# Patient Record
Sex: Male | Born: 1945 | Race: White | Hispanic: No | Marital: Married | Smoking: Former smoker
Health system: Southern US, Community
[De-identification: ages and names within clinical notes are randomized; demographics above are authoritative.]

## PROBLEM LIST (undated history)

## (undated) DIAGNOSIS — G20A1 Parkinson's disease without dyskinesia, without mention of fluctuations: Secondary | ICD-10-CM

## (undated) DIAGNOSIS — F419 Anxiety disorder, unspecified: Secondary | ICD-10-CM

## (undated) DIAGNOSIS — E785 Hyperlipidemia, unspecified: Secondary | ICD-10-CM

## (undated) DIAGNOSIS — J449 Chronic obstructive pulmonary disease, unspecified: Secondary | ICD-10-CM

## (undated) DIAGNOSIS — I639 Cerebral infarction, unspecified: Secondary | ICD-10-CM

## (undated) DIAGNOSIS — G20 Parkinson's disease: Secondary | ICD-10-CM

## (undated) DIAGNOSIS — I951 Orthostatic hypotension: Secondary | ICD-10-CM

## (undated) DIAGNOSIS — M1990 Unspecified osteoarthritis, unspecified site: Secondary | ICD-10-CM

## (undated) DIAGNOSIS — F319 Bipolar disorder, unspecified: Secondary | ICD-10-CM

## (undated) DIAGNOSIS — I10 Essential (primary) hypertension: Secondary | ICD-10-CM

## (undated) DIAGNOSIS — Z8674 Personal history of sudden cardiac arrest: Secondary | ICD-10-CM

## (undated) HISTORY — DX: Hyperlipidemia, unspecified: E78.5

## (undated) HISTORY — DX: Orthostatic hypotension: I95.1

## (undated) HISTORY — DX: Bipolar disorder, unspecified: F31.9

## (undated) HISTORY — DX: Parkinson's disease without dyskinesia, without mention of fluctuations: G20.A1

## (undated) HISTORY — DX: Unspecified osteoarthritis, unspecified site: M19.90

## (undated) HISTORY — PX: ESOPHAGEAL DILATION: SHX303

## (undated) HISTORY — DX: Personal history of sudden cardiac arrest: Z86.74

## (undated) HISTORY — DX: Essential (primary) hypertension: I10

## (undated) HISTORY — DX: Anxiety disorder, unspecified: F41.9

## (undated) HISTORY — DX: Cerebral infarction, unspecified: I63.9

## (undated) HISTORY — DX: Chronic obstructive pulmonary disease, unspecified: J44.9

## (undated) HISTORY — DX: Parkinson's disease: G20

---

## 2001-07-03 ENCOUNTER — Emergency Department (HOSPITAL_COMMUNITY): Admission: EM | Admit: 2001-07-03 | Discharge: 2001-07-03 | Payer: Self-pay | Admitting: Emergency Medicine

## 2001-07-03 ENCOUNTER — Encounter: Payer: Self-pay | Admitting: Emergency Medicine

## 2001-11-12 ENCOUNTER — Encounter (INDEPENDENT_AMBULATORY_CARE_PROVIDER_SITE_OTHER): Payer: Self-pay | Admitting: Family Medicine

## 2001-11-18 ENCOUNTER — Emergency Department (HOSPITAL_COMMUNITY): Admission: EM | Admit: 2001-11-18 | Discharge: 2001-11-18 | Payer: Self-pay | Admitting: Emergency Medicine

## 2002-12-18 ENCOUNTER — Encounter: Payer: Self-pay | Admitting: Internal Medicine

## 2002-12-18 ENCOUNTER — Encounter: Admission: RE | Admit: 2002-12-18 | Discharge: 2002-12-18 | Payer: Self-pay | Admitting: Internal Medicine

## 2003-01-20 ENCOUNTER — Encounter (HOSPITAL_COMMUNITY): Admission: RE | Admit: 2003-01-20 | Discharge: 2003-02-19 | Payer: Self-pay | Admitting: Neurology

## 2003-02-22 ENCOUNTER — Encounter (HOSPITAL_COMMUNITY): Admission: RE | Admit: 2003-02-22 | Discharge: 2003-03-24 | Payer: Self-pay | Admitting: Neurology

## 2003-02-24 ENCOUNTER — Ambulatory Visit (HOSPITAL_COMMUNITY): Admission: RE | Admit: 2003-02-24 | Discharge: 2003-02-24 | Payer: Self-pay | Admitting: Neurology

## 2003-02-24 ENCOUNTER — Encounter: Payer: Self-pay | Admitting: Neurology

## 2003-06-09 ENCOUNTER — Encounter: Admission: RE | Admit: 2003-06-09 | Discharge: 2003-06-09 | Payer: Self-pay | Admitting: Neurosurgery

## 2003-06-24 ENCOUNTER — Encounter: Admission: RE | Admit: 2003-06-24 | Discharge: 2003-06-24 | Payer: Self-pay | Admitting: Neurosurgery

## 2003-07-12 ENCOUNTER — Encounter: Admission: RE | Admit: 2003-07-12 | Discharge: 2003-07-12 | Payer: Self-pay | Admitting: Neurosurgery

## 2003-08-01 ENCOUNTER — Encounter: Admission: RE | Admit: 2003-08-01 | Discharge: 2003-08-01 | Payer: Self-pay | Admitting: Neurosurgery

## 2003-08-17 ENCOUNTER — Encounter (HOSPITAL_COMMUNITY): Admission: RE | Admit: 2003-08-17 | Discharge: 2003-09-16 | Payer: Self-pay | Admitting: Neurosurgery

## 2003-09-28 ENCOUNTER — Encounter (HOSPITAL_COMMUNITY): Admission: RE | Admit: 2003-09-28 | Discharge: 2003-10-28 | Payer: Self-pay | Admitting: Neurosurgery

## 2003-12-20 ENCOUNTER — Encounter (HOSPITAL_COMMUNITY): Admission: RE | Admit: 2003-12-20 | Discharge: 2004-01-19 | Payer: Self-pay | Admitting: Internal Medicine

## 2004-01-23 ENCOUNTER — Encounter (INDEPENDENT_AMBULATORY_CARE_PROVIDER_SITE_OTHER): Payer: Self-pay | Admitting: Family Medicine

## 2004-03-14 ENCOUNTER — Ambulatory Visit: Payer: Self-pay | Admitting: Internal Medicine

## 2004-04-20 ENCOUNTER — Ambulatory Visit: Payer: Self-pay | Admitting: Internal Medicine

## 2004-05-08 ENCOUNTER — Emergency Department (HOSPITAL_COMMUNITY): Admission: EM | Admit: 2004-05-08 | Discharge: 2004-05-08 | Payer: Self-pay | Admitting: Emergency Medicine

## 2004-06-20 ENCOUNTER — Ambulatory Visit: Payer: Self-pay | Admitting: Gastroenterology

## 2004-06-20 ENCOUNTER — Ambulatory Visit: Payer: Self-pay | Admitting: Internal Medicine

## 2004-07-04 ENCOUNTER — Ambulatory Visit: Payer: Self-pay | Admitting: Gastroenterology

## 2004-07-06 ENCOUNTER — Ambulatory Visit (HOSPITAL_COMMUNITY): Admission: RE | Admit: 2004-07-06 | Discharge: 2004-07-06 | Payer: Self-pay | Admitting: Gastroenterology

## 2004-09-17 ENCOUNTER — Ambulatory Visit: Payer: Self-pay | Admitting: Internal Medicine

## 2004-11-21 ENCOUNTER — Emergency Department (HOSPITAL_COMMUNITY): Admission: EM | Admit: 2004-11-21 | Discharge: 2004-11-21 | Payer: Self-pay | Admitting: Emergency Medicine

## 2004-11-22 ENCOUNTER — Emergency Department (HOSPITAL_COMMUNITY): Admission: EM | Admit: 2004-11-22 | Discharge: 2004-11-22 | Payer: Self-pay | Admitting: Emergency Medicine

## 2004-11-23 ENCOUNTER — Ambulatory Visit: Payer: Self-pay | Admitting: Internal Medicine

## 2005-01-17 ENCOUNTER — Ambulatory Visit: Payer: Self-pay | Admitting: Internal Medicine

## 2005-01-25 ENCOUNTER — Ambulatory Visit: Payer: Self-pay | Admitting: Psychology

## 2005-04-04 ENCOUNTER — Ambulatory Visit: Payer: Self-pay | Admitting: Psychology

## 2005-05-02 ENCOUNTER — Ambulatory Visit: Payer: Self-pay | Admitting: Psychology

## 2005-06-03 ENCOUNTER — Ambulatory Visit: Payer: Self-pay | Admitting: Psychology

## 2005-06-17 ENCOUNTER — Ambulatory Visit: Payer: Self-pay | Admitting: Psychology

## 2005-07-01 ENCOUNTER — Ambulatory Visit: Payer: Self-pay | Admitting: Psychology

## 2005-07-12 ENCOUNTER — Ambulatory Visit: Payer: Self-pay | Admitting: Internal Medicine

## 2005-07-15 ENCOUNTER — Ambulatory Visit: Payer: Self-pay | Admitting: Psychology

## 2005-08-12 ENCOUNTER — Ambulatory Visit: Payer: Self-pay | Admitting: Psychology

## 2005-08-26 ENCOUNTER — Ambulatory Visit: Payer: Self-pay | Admitting: Psychology

## 2005-09-09 ENCOUNTER — Ambulatory Visit: Payer: Self-pay | Admitting: Psychology

## 2005-09-23 ENCOUNTER — Ambulatory Visit: Payer: Self-pay | Admitting: Psychology

## 2005-09-26 ENCOUNTER — Emergency Department (HOSPITAL_COMMUNITY): Admission: EM | Admit: 2005-09-26 | Discharge: 2005-09-26 | Payer: Self-pay | Admitting: Emergency Medicine

## 2005-10-03 ENCOUNTER — Ambulatory Visit: Payer: Self-pay | Admitting: Internal Medicine

## 2005-10-21 ENCOUNTER — Ambulatory Visit: Payer: Self-pay | Admitting: Psychology

## 2005-10-26 ENCOUNTER — Emergency Department (HOSPITAL_COMMUNITY): Admission: EM | Admit: 2005-10-26 | Discharge: 2005-10-26 | Payer: Self-pay | Admitting: Emergency Medicine

## 2005-11-14 ENCOUNTER — Emergency Department (HOSPITAL_COMMUNITY): Admission: EM | Admit: 2005-11-14 | Discharge: 2005-11-15 | Payer: Self-pay | Admitting: *Deleted

## 2005-11-18 ENCOUNTER — Observation Stay (HOSPITAL_COMMUNITY): Admission: EM | Admit: 2005-11-18 | Discharge: 2005-11-20 | Payer: Self-pay | Admitting: Emergency Medicine

## 2005-11-20 ENCOUNTER — Ambulatory Visit: Payer: Self-pay | Admitting: Internal Medicine

## 2005-12-15 ENCOUNTER — Inpatient Hospital Stay (HOSPITAL_COMMUNITY): Admission: EM | Admit: 2005-12-15 | Discharge: 2005-12-18 | Payer: Self-pay | Admitting: Emergency Medicine

## 2005-12-16 ENCOUNTER — Ambulatory Visit: Payer: Self-pay | Admitting: Cardiology

## 2005-12-16 ENCOUNTER — Encounter: Payer: Self-pay | Admitting: Cardiology

## 2005-12-31 ENCOUNTER — Emergency Department (HOSPITAL_COMMUNITY): Admission: EM | Admit: 2005-12-31 | Discharge: 2006-01-01 | Payer: Self-pay | Admitting: Emergency Medicine

## 2006-01-13 ENCOUNTER — Ambulatory Visit: Payer: Self-pay | Admitting: Family Medicine

## 2006-01-30 ENCOUNTER — Ambulatory Visit: Payer: Self-pay | Admitting: Family Medicine

## 2006-02-03 ENCOUNTER — Emergency Department (HOSPITAL_COMMUNITY): Admission: EM | Admit: 2006-02-03 | Discharge: 2006-02-03 | Payer: Self-pay | Admitting: Emergency Medicine

## 2006-02-05 ENCOUNTER — Ambulatory Visit: Payer: Self-pay | Admitting: Internal Medicine

## 2006-02-05 ENCOUNTER — Emergency Department (HOSPITAL_COMMUNITY): Admission: EM | Admit: 2006-02-05 | Discharge: 2006-02-06 | Payer: Self-pay | Admitting: Emergency Medicine

## 2006-02-05 ENCOUNTER — Emergency Department (HOSPITAL_COMMUNITY): Admission: EM | Admit: 2006-02-05 | Discharge: 2006-02-05 | Payer: Self-pay | Admitting: Emergency Medicine

## 2006-02-05 HISTORY — PX: ESOPHAGOGASTRODUODENOSCOPY: SHX1529

## 2006-02-06 ENCOUNTER — Inpatient Hospital Stay (HOSPITAL_COMMUNITY): Admission: EM | Admit: 2006-02-06 | Discharge: 2006-02-09 | Payer: Self-pay | Admitting: Emergency Medicine

## 2006-02-18 ENCOUNTER — Ambulatory Visit: Payer: Self-pay | Admitting: Family Medicine

## 2006-03-03 ENCOUNTER — Ambulatory Visit: Payer: Self-pay | Admitting: Family Medicine

## 2006-03-10 ENCOUNTER — Emergency Department (HOSPITAL_COMMUNITY): Admission: EM | Admit: 2006-03-10 | Discharge: 2006-03-10 | Payer: Self-pay | Admitting: Emergency Medicine

## 2006-03-12 ENCOUNTER — Ambulatory Visit: Payer: Self-pay | Admitting: Family Medicine

## 2006-03-13 ENCOUNTER — Ambulatory Visit (HOSPITAL_COMMUNITY): Payer: Self-pay | Admitting: Psychiatry

## 2006-04-01 ENCOUNTER — Ambulatory Visit (HOSPITAL_COMMUNITY): Payer: Self-pay | Admitting: Psychiatry

## 2006-04-03 ENCOUNTER — Ambulatory Visit: Payer: Self-pay | Admitting: Family Medicine

## 2006-04-09 ENCOUNTER — Encounter: Payer: Self-pay | Admitting: Family Medicine

## 2006-04-09 DIAGNOSIS — N318 Other neuromuscular dysfunction of bladder: Secondary | ICD-10-CM

## 2006-04-09 DIAGNOSIS — F411 Generalized anxiety disorder: Secondary | ICD-10-CM | POA: Insufficient documentation

## 2006-04-09 DIAGNOSIS — F329 Major depressive disorder, single episode, unspecified: Secondary | ICD-10-CM

## 2006-04-09 DIAGNOSIS — M129 Arthropathy, unspecified: Secondary | ICD-10-CM | POA: Insufficient documentation

## 2006-04-09 DIAGNOSIS — M545 Low back pain: Secondary | ICD-10-CM

## 2006-04-09 DIAGNOSIS — Z8679 Personal history of other diseases of the circulatory system: Secondary | ICD-10-CM | POA: Insufficient documentation

## 2006-04-09 DIAGNOSIS — F319 Bipolar disorder, unspecified: Secondary | ICD-10-CM | POA: Insufficient documentation

## 2006-04-09 DIAGNOSIS — J449 Chronic obstructive pulmonary disease, unspecified: Secondary | ICD-10-CM

## 2006-04-09 DIAGNOSIS — R32 Unspecified urinary incontinence: Secondary | ICD-10-CM

## 2006-04-09 DIAGNOSIS — N400 Enlarged prostate without lower urinary tract symptoms: Secondary | ICD-10-CM | POA: Insufficient documentation

## 2006-04-09 DIAGNOSIS — G20 Parkinson's disease: Secondary | ICD-10-CM

## 2006-04-09 DIAGNOSIS — J4489 Other specified chronic obstructive pulmonary disease: Secondary | ICD-10-CM | POA: Insufficient documentation

## 2006-04-09 DIAGNOSIS — F3289 Other specified depressive episodes: Secondary | ICD-10-CM | POA: Insufficient documentation

## 2006-04-09 DIAGNOSIS — K219 Gastro-esophageal reflux disease without esophagitis: Secondary | ICD-10-CM

## 2006-04-09 DIAGNOSIS — I10 Essential (primary) hypertension: Secondary | ICD-10-CM | POA: Insufficient documentation

## 2006-04-21 ENCOUNTER — Encounter (INDEPENDENT_AMBULATORY_CARE_PROVIDER_SITE_OTHER): Payer: Self-pay | Admitting: Family Medicine

## 2006-05-01 ENCOUNTER — Ambulatory Visit: Payer: Self-pay | Admitting: Family Medicine

## 2006-05-01 ENCOUNTER — Ambulatory Visit: Payer: Self-pay | Admitting: Internal Medicine

## 2006-05-13 ENCOUNTER — Ambulatory Visit: Payer: Self-pay | Admitting: Family Medicine

## 2006-05-14 ENCOUNTER — Encounter (INDEPENDENT_AMBULATORY_CARE_PROVIDER_SITE_OTHER): Payer: Self-pay | Admitting: Family Medicine

## 2006-05-14 LAB — CONVERTED CEMR LAB
ALT: 14 units/L (ref 0–53)
AST: 15 units/L (ref 0–37)
Albumin: 4.4 g/dL (ref 3.5–5.2)
Alkaline Phosphatase: 66 units/L (ref 39–117)
BUN: 14 mg/dL (ref 6–23)
Basophils Absolute: 0 10*3/uL (ref 0.0–0.1)
Basophils Relative: 1 % (ref 0–1)
CO2: 22 meq/L (ref 19–32)
Calcium: 9.6 mg/dL (ref 8.4–10.5)
Chloride: 105 meq/L (ref 96–112)
Creatinine, Ser: 0.94 mg/dL (ref 0.40–1.50)
Eosinophils Relative: 2 % (ref 0–5)
Glucose, Bld: 91 mg/dL (ref 70–99)
HCT: 45.6 % (ref 39.0–52.0)
Hemoglobin: 14.9 g/dL (ref 13.0–17.0)
Lymphocytes Relative: 27 % (ref 12–46)
Lymphs Abs: 1.5 10*3/uL (ref 0.7–3.3)
MCHC: 32.7 g/dL (ref 30.0–36.0)
MCV: 91 fL (ref 78.0–100.0)
Monocytes Absolute: 0.5 10*3/uL (ref 0.2–0.7)
Monocytes Relative: 8 % (ref 3–11)
Neutro Abs: 3.5 10*3/uL (ref 1.7–7.7)
Neutrophils Relative %: 62 % (ref 43–77)
Platelets: 213 10*3/uL (ref 150–400)
Potassium: 4.5 meq/L (ref 3.5–5.3)
RBC: 5.01 M/uL (ref 4.22–5.81)
RDW: 13.8 % (ref 11.5–14.0)
Sodium: 140 meq/L (ref 135–145)
Total Bilirubin: 0.4 mg/dL (ref 0.3–1.2)
Total Protein: 7.6 g/dL (ref 6.0–8.3)
WBC: 5.6 10*3/uL (ref 4.0–10.5)

## 2006-05-15 ENCOUNTER — Ambulatory Visit (HOSPITAL_COMMUNITY): Admission: RE | Admit: 2006-05-15 | Discharge: 2006-05-15 | Payer: Self-pay | Admitting: Internal Medicine

## 2006-05-15 ENCOUNTER — Encounter (INDEPENDENT_AMBULATORY_CARE_PROVIDER_SITE_OTHER): Payer: Self-pay | Admitting: Family Medicine

## 2006-05-15 ENCOUNTER — Encounter (INDEPENDENT_AMBULATORY_CARE_PROVIDER_SITE_OTHER): Payer: Self-pay | Admitting: Specialist

## 2006-05-15 ENCOUNTER — Ambulatory Visit: Payer: Self-pay | Admitting: Internal Medicine

## 2006-05-15 HISTORY — PX: COLONOSCOPY: SHX174

## 2006-05-16 ENCOUNTER — Emergency Department (HOSPITAL_COMMUNITY): Admission: EM | Admit: 2006-05-16 | Discharge: 2006-05-16 | Payer: Self-pay | Admitting: Emergency Medicine

## 2006-05-20 ENCOUNTER — Ambulatory Visit (HOSPITAL_COMMUNITY): Payer: Self-pay | Admitting: Psychiatry

## 2006-05-27 ENCOUNTER — Ambulatory Visit: Payer: Self-pay | Admitting: Family Medicine

## 2006-05-27 ENCOUNTER — Ambulatory Visit (HOSPITAL_COMMUNITY): Payer: Self-pay | Admitting: Psychiatry

## 2006-05-29 ENCOUNTER — Ambulatory Visit (HOSPITAL_COMMUNITY): Payer: Self-pay | Admitting: Psychology

## 2006-06-02 ENCOUNTER — Ambulatory Visit: Payer: Self-pay | Admitting: Internal Medicine

## 2006-06-06 ENCOUNTER — Encounter (HOSPITAL_COMMUNITY): Admission: RE | Admit: 2006-06-06 | Discharge: 2006-07-06 | Payer: Self-pay | Admitting: Internal Medicine

## 2006-06-11 ENCOUNTER — Ambulatory Visit (HOSPITAL_COMMUNITY): Payer: Self-pay | Admitting: Psychology

## 2006-06-16 ENCOUNTER — Ambulatory Visit: Payer: Self-pay | Admitting: Family Medicine

## 2006-06-16 ENCOUNTER — Ambulatory Visit (HOSPITAL_COMMUNITY): Admission: RE | Admit: 2006-06-16 | Discharge: 2006-06-16 | Payer: Self-pay | Admitting: Family Medicine

## 2006-06-16 DIAGNOSIS — K5909 Other constipation: Secondary | ICD-10-CM | POA: Insufficient documentation

## 2006-06-16 DIAGNOSIS — L570 Actinic keratosis: Secondary | ICD-10-CM

## 2006-06-16 LAB — CONVERTED CEMR LAB

## 2006-06-17 ENCOUNTER — Ambulatory Visit (HOSPITAL_COMMUNITY): Payer: Self-pay | Admitting: Psychiatry

## 2006-06-23 ENCOUNTER — Encounter (INDEPENDENT_AMBULATORY_CARE_PROVIDER_SITE_OTHER): Payer: Self-pay | Admitting: Family Medicine

## 2006-06-27 ENCOUNTER — Encounter (INDEPENDENT_AMBULATORY_CARE_PROVIDER_SITE_OTHER): Payer: Self-pay | Admitting: Family Medicine

## 2006-06-28 ENCOUNTER — Emergency Department (HOSPITAL_COMMUNITY): Admission: EM | Admit: 2006-06-28 | Discharge: 2006-06-28 | Payer: Self-pay | Admitting: Emergency Medicine

## 2006-06-30 ENCOUNTER — Telehealth (INDEPENDENT_AMBULATORY_CARE_PROVIDER_SITE_OTHER): Payer: Self-pay | Admitting: Family Medicine

## 2006-07-14 ENCOUNTER — Ambulatory Visit: Payer: Self-pay | Admitting: Family Medicine

## 2006-07-14 ENCOUNTER — Ambulatory Visit (HOSPITAL_COMMUNITY): Payer: Self-pay | Admitting: Psychology

## 2006-07-17 IMAGING — CR DG CHEST 2V
2 series · 2 of 2 positions shown · non-contrast
Comparison: 11/21/2004.

CLINICAL DATA: Chest pain.  
 CHEST - 2 VIEW:

[view not recorded (1 of 2)]
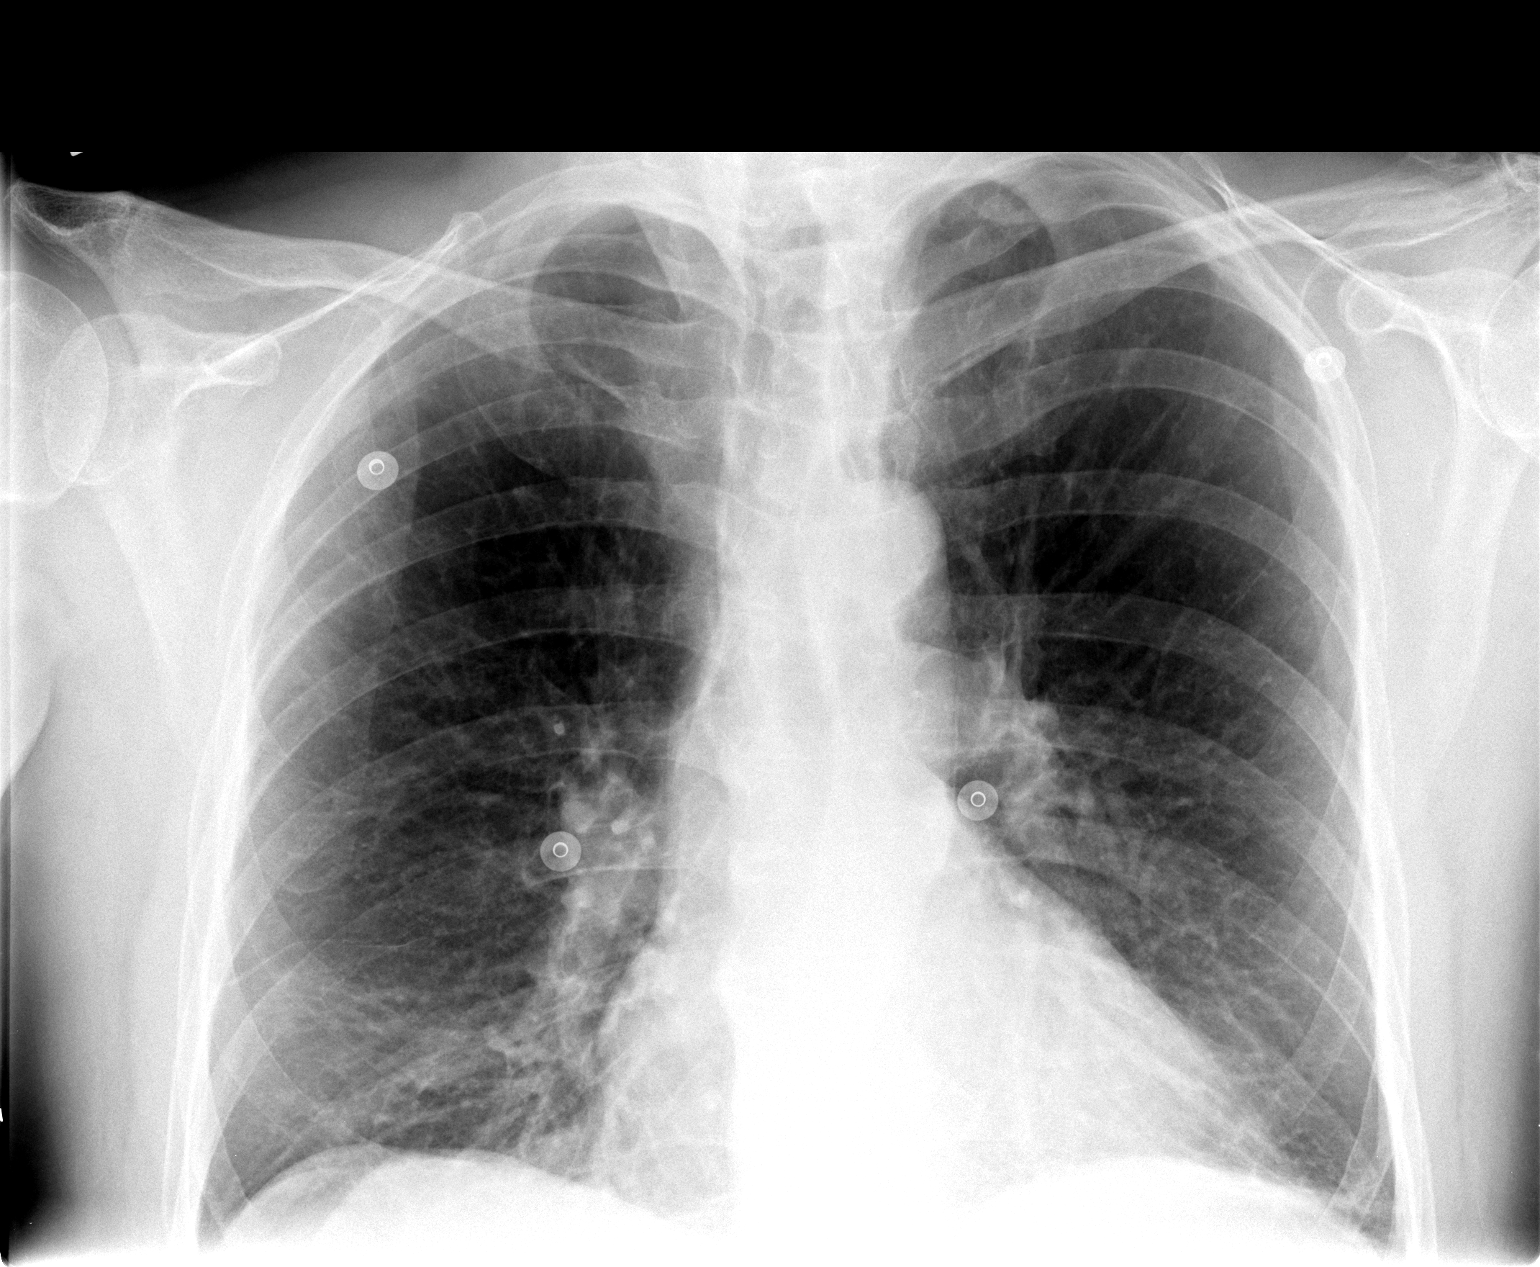

[view not recorded (2 of 2)]
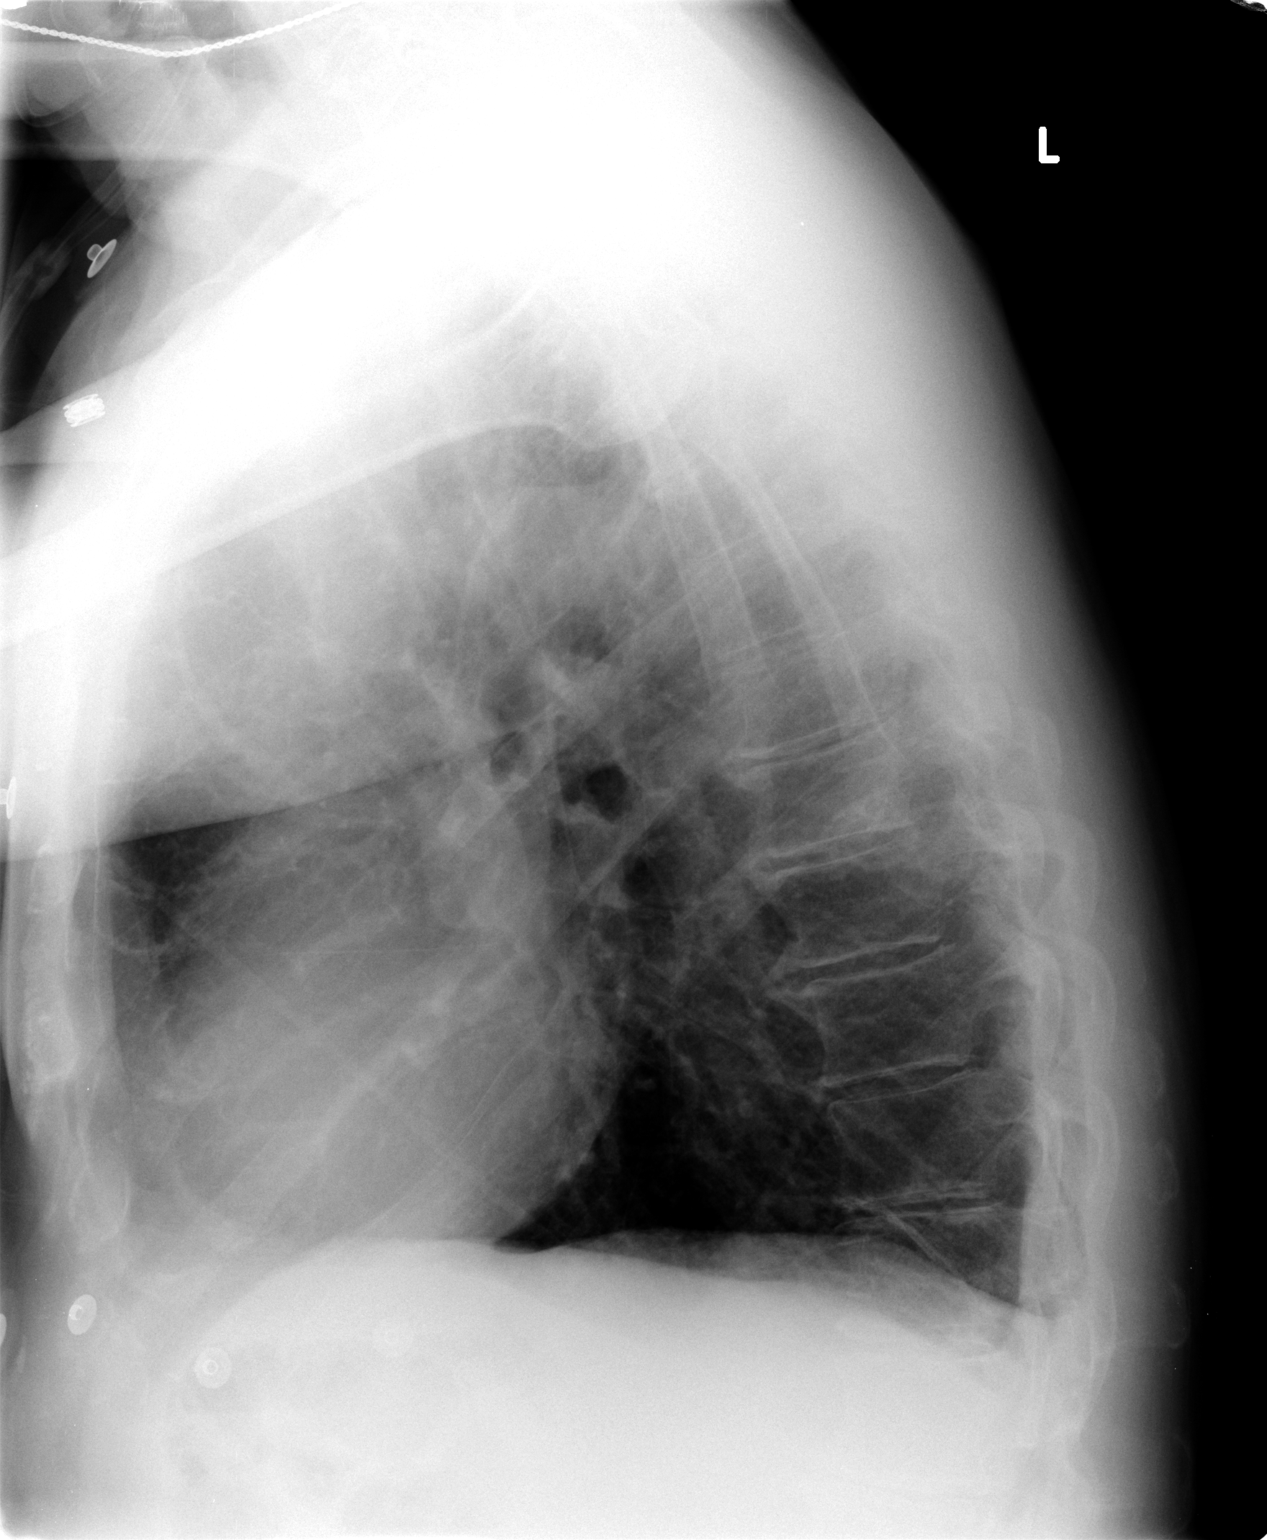

[2 of 2 positions shown; findings below may reference images not displayed]

Cardiomegaly and probable COPD noted.  Mild peribronchial thickening is again present.  Mild bibasilar atelectasis is now noted.  There may be tiny bilateral pleural effusions present.
IMPRESSION: 1.   Bibasilar atelectasis.  Question tiny bilateral pleural effusions. 
 2.  COPD and cardiomegaly.

## 2006-07-22 ENCOUNTER — Ambulatory Visit: Payer: Self-pay | Admitting: Internal Medicine

## 2006-07-22 ENCOUNTER — Encounter (INDEPENDENT_AMBULATORY_CARE_PROVIDER_SITE_OTHER): Payer: Self-pay | Admitting: Family Medicine

## 2006-07-29 ENCOUNTER — Ambulatory Visit (HOSPITAL_COMMUNITY): Payer: Self-pay | Admitting: Psychiatry

## 2006-08-06 ENCOUNTER — Ambulatory Visit (HOSPITAL_COMMUNITY): Payer: Self-pay | Admitting: Psychology

## 2006-08-08 ENCOUNTER — Encounter (INDEPENDENT_AMBULATORY_CARE_PROVIDER_SITE_OTHER): Payer: Self-pay | Admitting: Family Medicine

## 2006-08-11 ENCOUNTER — Ambulatory Visit: Payer: Self-pay | Admitting: Family Medicine

## 2006-08-11 DIAGNOSIS — K8020 Calculus of gallbladder without cholecystitis without obstruction: Secondary | ICD-10-CM | POA: Insufficient documentation

## 2006-08-14 ENCOUNTER — Telehealth (INDEPENDENT_AMBULATORY_CARE_PROVIDER_SITE_OTHER): Payer: Self-pay | Admitting: Family Medicine

## 2006-08-15 ENCOUNTER — Ambulatory Visit: Payer: Self-pay | Admitting: Family Medicine

## 2006-08-15 ENCOUNTER — Telehealth (INDEPENDENT_AMBULATORY_CARE_PROVIDER_SITE_OTHER): Payer: Self-pay | Admitting: Family Medicine

## 2006-08-15 ENCOUNTER — Telehealth (INDEPENDENT_AMBULATORY_CARE_PROVIDER_SITE_OTHER): Payer: Self-pay | Admitting: *Deleted

## 2006-08-21 ENCOUNTER — Ambulatory Visit (HOSPITAL_COMMUNITY): Payer: Self-pay | Admitting: Psychiatry

## 2006-08-29 ENCOUNTER — Encounter (INDEPENDENT_AMBULATORY_CARE_PROVIDER_SITE_OTHER): Payer: Self-pay | Admitting: Family Medicine

## 2006-09-01 ENCOUNTER — Telehealth (INDEPENDENT_AMBULATORY_CARE_PROVIDER_SITE_OTHER): Payer: Self-pay | Admitting: *Deleted

## 2006-09-01 ENCOUNTER — Ambulatory Visit: Payer: Self-pay | Admitting: Family Medicine

## 2006-09-02 ENCOUNTER — Encounter (INDEPENDENT_AMBULATORY_CARE_PROVIDER_SITE_OTHER): Payer: Self-pay | Admitting: Family Medicine

## 2006-09-02 LAB — CONVERTED CEMR LAB
Albumin: 4.3 g/dL (ref 3.5–5.2)
CO2: 19 meq/L (ref 19–32)
Calcium: 9.2 mg/dL (ref 8.4–10.5)
Chloride: 107 meq/L (ref 96–112)
Cholesterol: 188 mg/dL (ref 0–200)
Eosinophils Relative: 4 % (ref 0–5)
Glucose, Bld: 84 mg/dL (ref 70–99)
HCT: 45.1 % (ref 39.0–52.0)
Hemoglobin: 14.8 g/dL (ref 13.0–17.0)
Lymphocytes Relative: 33 % (ref 12–46)
Lymphs Abs: 1.5 10*3/uL (ref 0.7–3.3)
Platelets: 226 10*3/uL (ref 150–400)
Sodium: 142 meq/L (ref 135–145)
Total Bilirubin: 0.4 mg/dL (ref 0.3–1.2)
Total Protein: 7.6 g/dL (ref 6.0–8.3)
Triglycerides: 159 mg/dL — ABNORMAL HIGH (ref <150)
VLDL: 32 mg/dL (ref 0–40)
WBC: 4.6 10*3/uL (ref 4.0–10.5)

## 2006-09-04 ENCOUNTER — Ambulatory Visit: Payer: Self-pay | Admitting: Cardiology

## 2006-09-08 ENCOUNTER — Ambulatory Visit (HOSPITAL_COMMUNITY): Admission: RE | Admit: 2006-09-08 | Discharge: 2006-09-08 | Payer: Self-pay | Admitting: Family Medicine

## 2006-09-08 ENCOUNTER — Encounter (HOSPITAL_COMMUNITY): Admission: RE | Admit: 2006-09-08 | Discharge: 2006-10-08 | Payer: Self-pay | Admitting: Cardiology

## 2006-09-08 ENCOUNTER — Ambulatory Visit: Payer: Self-pay | Admitting: Cardiology

## 2006-09-11 ENCOUNTER — Encounter (INDEPENDENT_AMBULATORY_CARE_PROVIDER_SITE_OTHER): Payer: Self-pay | Admitting: Family Medicine

## 2006-09-11 ENCOUNTER — Ambulatory Visit: Payer: Self-pay | Admitting: Cardiology

## 2006-09-15 ENCOUNTER — Ambulatory Visit: Payer: Self-pay | Admitting: Family Medicine

## 2006-09-15 DIAGNOSIS — E785 Hyperlipidemia, unspecified: Secondary | ICD-10-CM | POA: Insufficient documentation

## 2006-09-15 LAB — CONVERTED CEMR LAB
HDL goal, serum: 40 mg/dL
LDL Goal: 100 mg/dL

## 2006-09-18 ENCOUNTER — Encounter (INDEPENDENT_AMBULATORY_CARE_PROVIDER_SITE_OTHER): Payer: Self-pay | Admitting: Family Medicine

## 2006-09-30 ENCOUNTER — Ambulatory Visit (HOSPITAL_COMMUNITY): Payer: Self-pay | Admitting: Psychiatry

## 2006-10-03 ENCOUNTER — Telehealth (INDEPENDENT_AMBULATORY_CARE_PROVIDER_SITE_OTHER): Payer: Self-pay | Admitting: Family Medicine

## 2006-10-09 ENCOUNTER — Encounter (INDEPENDENT_AMBULATORY_CARE_PROVIDER_SITE_OTHER): Payer: Self-pay | Admitting: Family Medicine

## 2006-10-13 ENCOUNTER — Ambulatory Visit: Payer: Self-pay | Admitting: Family Medicine

## 2006-10-14 ENCOUNTER — Encounter (INDEPENDENT_AMBULATORY_CARE_PROVIDER_SITE_OTHER): Payer: Self-pay | Admitting: Family Medicine

## 2006-10-14 LAB — CONVERTED CEMR LAB
ALT: 9 units/L (ref 0–53)
AST: 18 units/L (ref 0–37)
Albumin: 4.3 g/dL (ref 3.5–5.2)
Alkaline Phosphatase: 62 units/L (ref 39–117)
Bilirubin, Direct: <0.1 mg/dL (ref 0.0–0.3)
Total Bilirubin: 0.4 mg/dL (ref 0.3–1.2)
Total Protein: 7.6 g/dL (ref 6.0–8.3)

## 2006-10-23 ENCOUNTER — Telehealth (INDEPENDENT_AMBULATORY_CARE_PROVIDER_SITE_OTHER): Payer: Self-pay | Admitting: Family Medicine

## 2006-11-03 ENCOUNTER — Ambulatory Visit: Admission: RE | Admit: 2006-11-03 | Discharge: 2006-11-03 | Payer: Self-pay | Admitting: Family Medicine

## 2006-11-03 ENCOUNTER — Telehealth (INDEPENDENT_AMBULATORY_CARE_PROVIDER_SITE_OTHER): Payer: Self-pay | Admitting: *Deleted

## 2006-11-04 ENCOUNTER — Ambulatory Visit: Payer: Self-pay | Admitting: Family Medicine

## 2006-11-05 ENCOUNTER — Encounter (INDEPENDENT_AMBULATORY_CARE_PROVIDER_SITE_OTHER): Payer: Self-pay | Admitting: Family Medicine

## 2006-11-06 ENCOUNTER — Telehealth (INDEPENDENT_AMBULATORY_CARE_PROVIDER_SITE_OTHER): Payer: Self-pay | Admitting: *Deleted

## 2006-11-06 ENCOUNTER — Encounter (INDEPENDENT_AMBULATORY_CARE_PROVIDER_SITE_OTHER): Payer: Self-pay | Admitting: Family Medicine

## 2006-11-06 LAB — CONVERTED CEMR LAB: PSA: 2.4 ng/mL (ref 0.10–4.00)

## 2006-11-08 ENCOUNTER — Ambulatory Visit: Payer: Self-pay | Admitting: Pulmonary Disease

## 2006-11-10 ENCOUNTER — Telehealth (INDEPENDENT_AMBULATORY_CARE_PROVIDER_SITE_OTHER): Payer: Self-pay | Admitting: Family Medicine

## 2006-11-17 ENCOUNTER — Encounter (INDEPENDENT_AMBULATORY_CARE_PROVIDER_SITE_OTHER): Payer: Self-pay | Admitting: Family Medicine

## 2006-11-19 ENCOUNTER — Encounter (INDEPENDENT_AMBULATORY_CARE_PROVIDER_SITE_OTHER): Payer: Self-pay | Admitting: Family Medicine

## 2006-11-19 ENCOUNTER — Telehealth (INDEPENDENT_AMBULATORY_CARE_PROVIDER_SITE_OTHER): Payer: Self-pay | Admitting: Family Medicine

## 2006-11-24 ENCOUNTER — Ambulatory Visit: Payer: Self-pay | Admitting: Family Medicine

## 2006-11-25 ENCOUNTER — Ambulatory Visit (HOSPITAL_COMMUNITY): Admission: RE | Admit: 2006-11-25 | Discharge: 2006-11-25 | Payer: Self-pay | Admitting: Family Medicine

## 2006-11-25 ENCOUNTER — Ambulatory Visit (HOSPITAL_COMMUNITY): Payer: Self-pay | Admitting: Psychiatry

## 2006-11-26 ENCOUNTER — Telehealth (INDEPENDENT_AMBULATORY_CARE_PROVIDER_SITE_OTHER): Payer: Self-pay | Admitting: *Deleted

## 2006-11-26 ENCOUNTER — Encounter (INDEPENDENT_AMBULATORY_CARE_PROVIDER_SITE_OTHER): Payer: Self-pay | Admitting: Family Medicine

## 2006-11-26 DIAGNOSIS — M51379 Other intervertebral disc degeneration, lumbosacral region without mention of lumbar back pain or lower extremity pain: Secondary | ICD-10-CM | POA: Insufficient documentation

## 2006-11-26 DIAGNOSIS — M5137 Other intervertebral disc degeneration, lumbosacral region: Secondary | ICD-10-CM | POA: Insufficient documentation

## 2006-11-27 ENCOUNTER — Ambulatory Visit (HOSPITAL_COMMUNITY): Admission: RE | Admit: 2006-11-27 | Discharge: 2006-11-27 | Payer: Self-pay | Admitting: Family Medicine

## 2006-11-27 IMAGING — CT CT ABDOMEN W/ CM
1 of 3 series · 14 of 32 positions shown, 19 images · IV contrast (CONTRAST)
Comparison: none

CLINICAL DATA: Abdominal pain, nausea, and vomiting. 
 ABDOMEN CT WITH CONTRAST:
TECHNIQUE: Multidetector CT imaging of the abdomen was performed following the standard protocol during bolus administration of intravenous contrast.
 Contrast:  100 cc Omnipaque 300
TECHNIQUE: Multidetector CT imaging of the pelvis was performed following the standard protocol during bolus administration of intravenous contrast.

[Series 4333: — · axial · 0.85mm/px · z∈[+1265,+1715]mm · 14 of 104 slices shown, 19 images]
[im 7/104  soft-tissue]
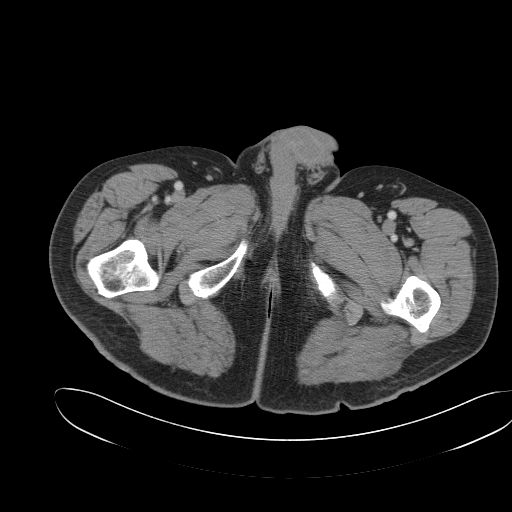
[im 7/104  bone]
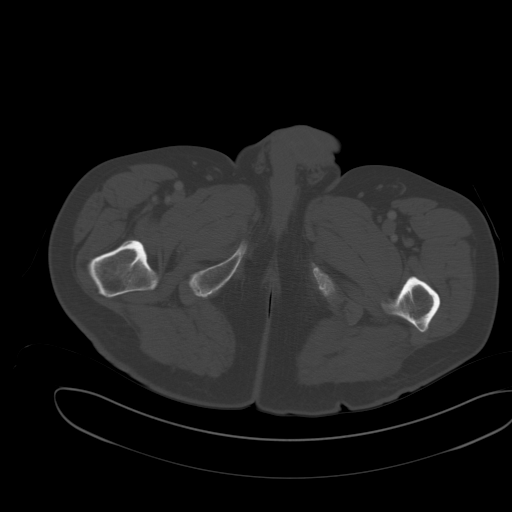
[im 13/104  soft-tissue]
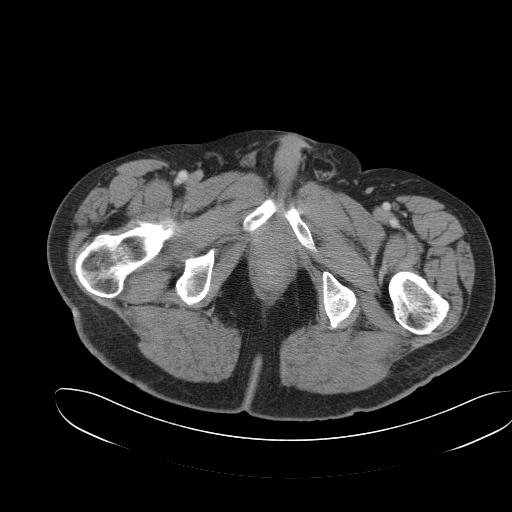
[im 25/104  soft-tissue]
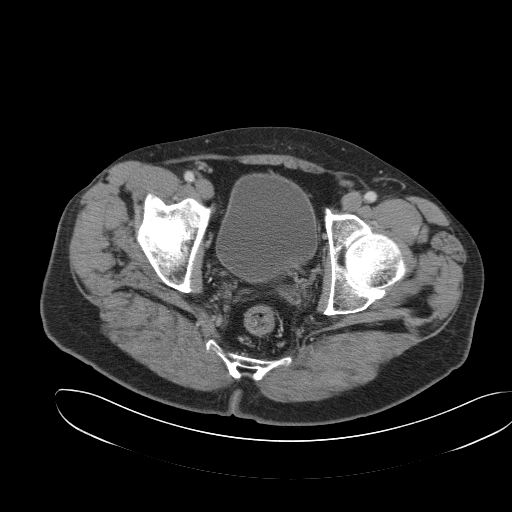
[im 31/104  soft-tissue]
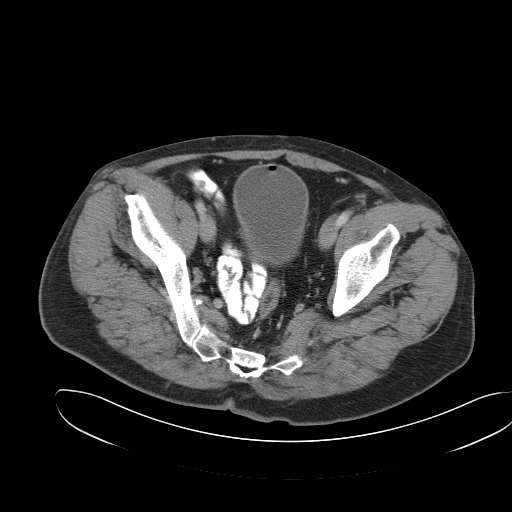
[im 37/104  soft-tissue]
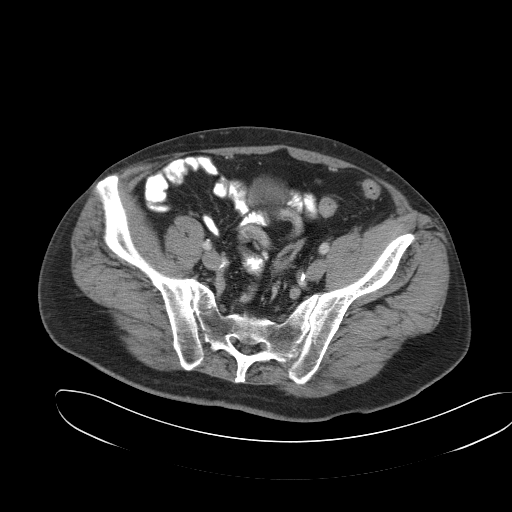
[im 43/104  soft-tissue]
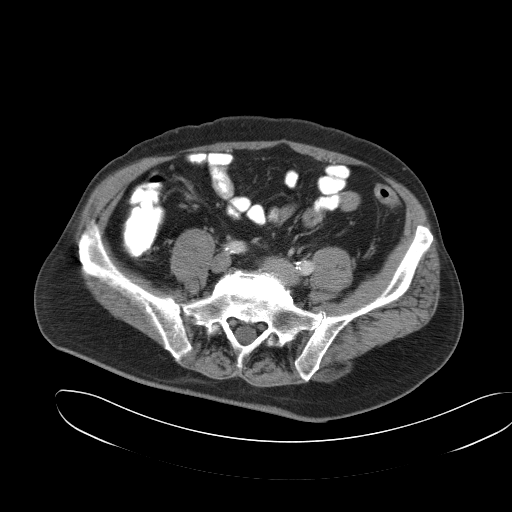
[im 55/104  soft-tissue]
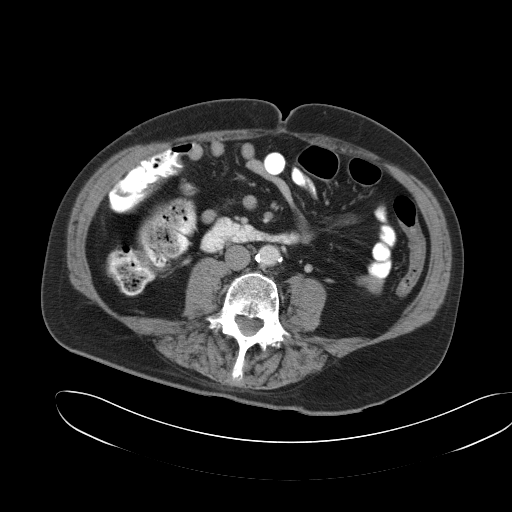
[im 61/104  soft-tissue]
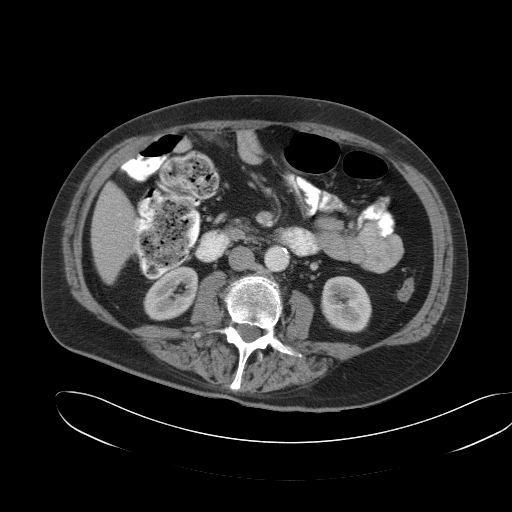
[im 67/104  soft-tissue]
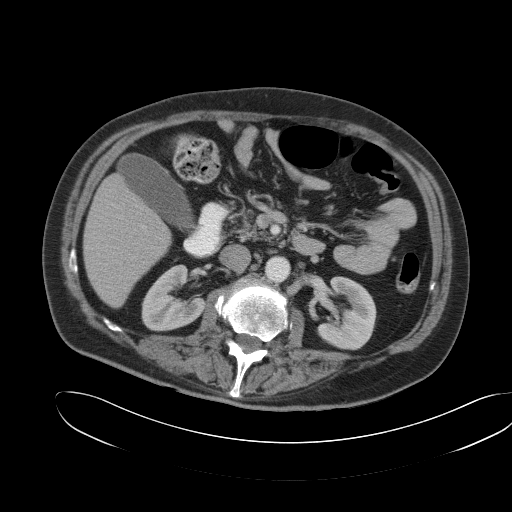
[im 67/104  bone]
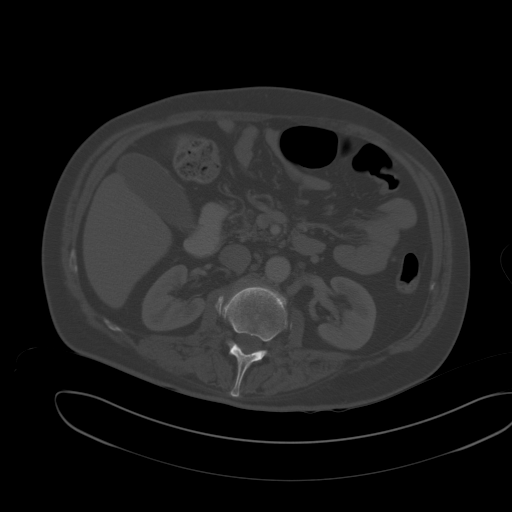
[im 73/104  soft-tissue]
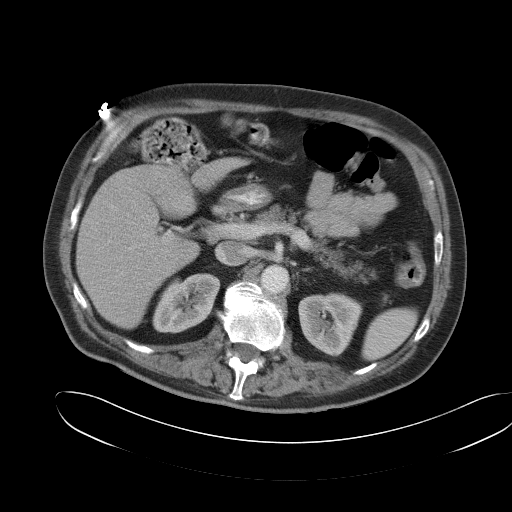
[im 79/104  soft-tissue]
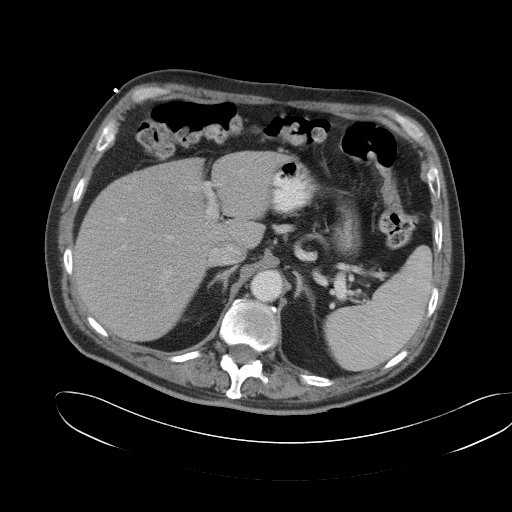
[im 79/104  lung]
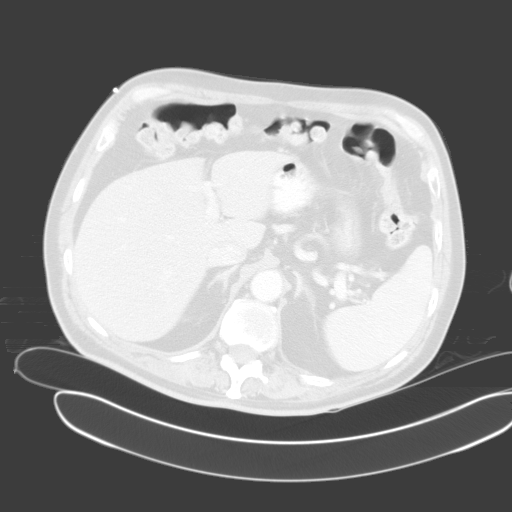
[im 85/104  lung]
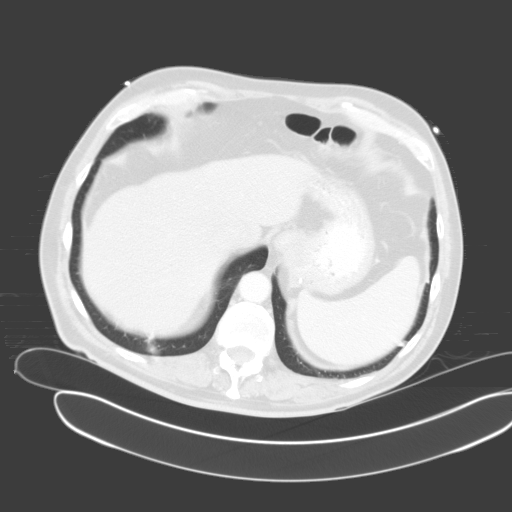
[im 91/104  soft-tissue]
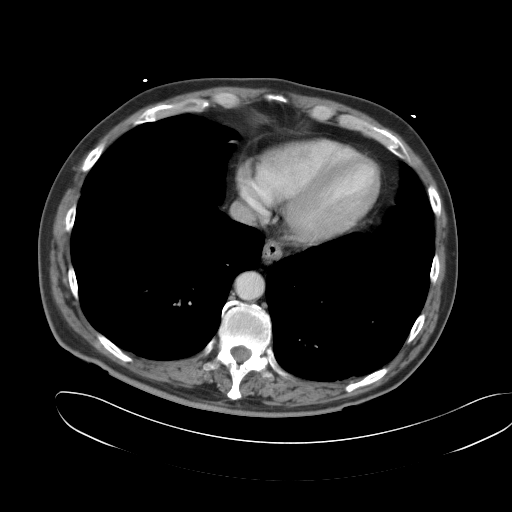
[im 91/104  lung]
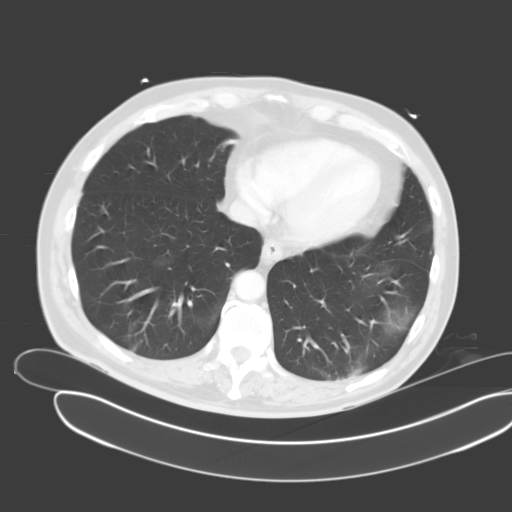
[im 97/104  soft-tissue]
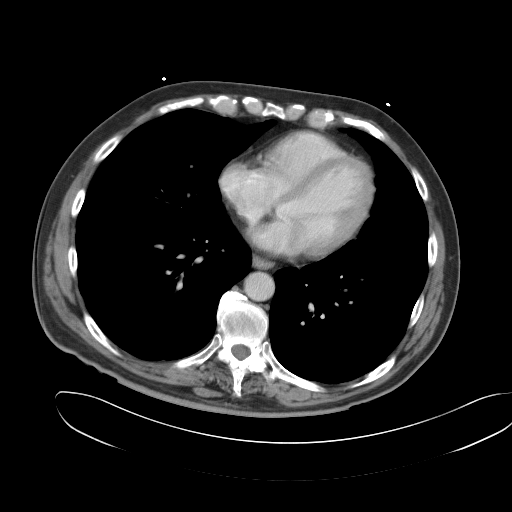
[im 97/104  lung]
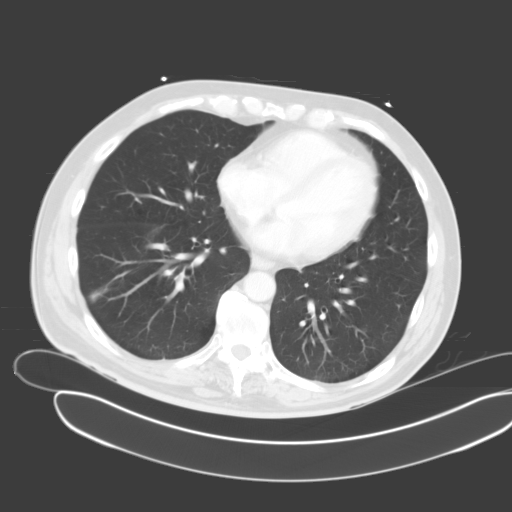

[14 of 32 positions shown; findings below may reference images not displayed]

FINDINGS: There is subsegmental atelectasis versus scarring at both lung bases.  No pleural or pericardial effusion.  
 The liver is normal in attenuation and morphology.  
 The spleen is negative. 
 The gallbladder is negative.  
 The pancreas is negative. 
 The left adrenal gland and the right adrenal gland are both negative.
 The right kidney is normal.
 The left kidney is normal. 
 No pathologically enlarged retroperitoneal lymph nodes are identified. 
 There are no enlarged small bowel mesenteric lymph nodes.   
 The visualized upper abdominal bowel loops are normal in course and caliber.  There is no evidence for bowel obstruction. 
 Review of the bone windows shows multilevel lumbar spondylosis.
IMPRESSION: 1.  No acute findings.
 2.  Bilateral lung base scar versus atelectasis. 
 PELVIS CT WITH CONTRAST:
FINDINGS: There is mild wall thickening involving an incompletely distended sigmoid colon.  
 The urinary bladder is negative.  There is no evidence for adenopathy.  
 There is no free fluid or abscess formation.
 Review of the bone windows shows no acute findings.
IMPRESSION: Mild wall thickening of the sigmoid colon, which is likely due to incomplete distention.

## 2006-11-28 ENCOUNTER — Encounter (INDEPENDENT_AMBULATORY_CARE_PROVIDER_SITE_OTHER): Payer: Self-pay | Admitting: Family Medicine

## 2006-11-28 ENCOUNTER — Telehealth (INDEPENDENT_AMBULATORY_CARE_PROVIDER_SITE_OTHER): Payer: Self-pay | Admitting: *Deleted

## 2006-12-01 ENCOUNTER — Telehealth (INDEPENDENT_AMBULATORY_CARE_PROVIDER_SITE_OTHER): Payer: Self-pay | Admitting: *Deleted

## 2006-12-04 ENCOUNTER — Telehealth (INDEPENDENT_AMBULATORY_CARE_PROVIDER_SITE_OTHER): Payer: Self-pay | Admitting: Family Medicine

## 2006-12-04 ENCOUNTER — Encounter (INDEPENDENT_AMBULATORY_CARE_PROVIDER_SITE_OTHER): Payer: Self-pay | Admitting: Family Medicine

## 2006-12-12 ENCOUNTER — Telehealth (INDEPENDENT_AMBULATORY_CARE_PROVIDER_SITE_OTHER): Payer: Self-pay | Admitting: Family Medicine

## 2006-12-12 ENCOUNTER — Encounter (INDEPENDENT_AMBULATORY_CARE_PROVIDER_SITE_OTHER): Payer: Self-pay | Admitting: Family Medicine

## 2006-12-22 ENCOUNTER — Telehealth (INDEPENDENT_AMBULATORY_CARE_PROVIDER_SITE_OTHER): Payer: Self-pay | Admitting: Family Medicine

## 2007-01-09 ENCOUNTER — Encounter (INDEPENDENT_AMBULATORY_CARE_PROVIDER_SITE_OTHER): Payer: Self-pay | Admitting: Family Medicine

## 2007-01-16 ENCOUNTER — Telehealth (INDEPENDENT_AMBULATORY_CARE_PROVIDER_SITE_OTHER): Payer: Self-pay | Admitting: *Deleted

## 2007-01-23 ENCOUNTER — Telehealth (INDEPENDENT_AMBULATORY_CARE_PROVIDER_SITE_OTHER): Payer: Self-pay | Admitting: Family Medicine

## 2007-01-27 ENCOUNTER — Ambulatory Visit (HOSPITAL_COMMUNITY): Payer: Self-pay | Admitting: Psychiatry

## 2007-02-18 ENCOUNTER — Telehealth (INDEPENDENT_AMBULATORY_CARE_PROVIDER_SITE_OTHER): Payer: Self-pay | Admitting: *Deleted

## 2007-02-20 ENCOUNTER — Telehealth (INDEPENDENT_AMBULATORY_CARE_PROVIDER_SITE_OTHER): Payer: Self-pay | Admitting: *Deleted

## 2007-02-23 ENCOUNTER — Telehealth (INDEPENDENT_AMBULATORY_CARE_PROVIDER_SITE_OTHER): Payer: Self-pay | Admitting: *Deleted

## 2007-02-24 ENCOUNTER — Ambulatory Visit: Payer: Self-pay | Admitting: Family Medicine

## 2007-02-25 ENCOUNTER — Encounter (INDEPENDENT_AMBULATORY_CARE_PROVIDER_SITE_OTHER): Payer: Self-pay | Admitting: Family Medicine

## 2007-02-25 ENCOUNTER — Telehealth (INDEPENDENT_AMBULATORY_CARE_PROVIDER_SITE_OTHER): Payer: Self-pay | Admitting: Family Medicine

## 2007-03-02 ENCOUNTER — Ambulatory Visit: Payer: Self-pay | Admitting: Family Medicine

## 2007-03-05 ENCOUNTER — Telehealth (INDEPENDENT_AMBULATORY_CARE_PROVIDER_SITE_OTHER): Payer: Self-pay | Admitting: *Deleted

## 2007-03-05 ENCOUNTER — Encounter (INDEPENDENT_AMBULATORY_CARE_PROVIDER_SITE_OTHER): Payer: Self-pay | Admitting: Family Medicine

## 2007-03-06 ENCOUNTER — Telehealth (INDEPENDENT_AMBULATORY_CARE_PROVIDER_SITE_OTHER): Payer: Self-pay | Admitting: Family Medicine

## 2007-03-06 ENCOUNTER — Encounter (INDEPENDENT_AMBULATORY_CARE_PROVIDER_SITE_OTHER): Payer: Self-pay | Admitting: Family Medicine

## 2007-03-06 ENCOUNTER — Telehealth (INDEPENDENT_AMBULATORY_CARE_PROVIDER_SITE_OTHER): Payer: Self-pay | Admitting: *Deleted

## 2007-03-09 ENCOUNTER — Encounter (INDEPENDENT_AMBULATORY_CARE_PROVIDER_SITE_OTHER): Payer: Self-pay | Admitting: Family Medicine

## 2007-03-26 ENCOUNTER — Ambulatory Visit (HOSPITAL_COMMUNITY): Payer: Self-pay | Admitting: Psychiatry

## 2007-03-30 ENCOUNTER — Ambulatory Visit: Payer: Self-pay | Admitting: Family Medicine

## 2007-04-16 ENCOUNTER — Encounter (INDEPENDENT_AMBULATORY_CARE_PROVIDER_SITE_OTHER): Payer: Self-pay | Admitting: Family Medicine

## 2007-04-23 ENCOUNTER — Encounter (INDEPENDENT_AMBULATORY_CARE_PROVIDER_SITE_OTHER): Payer: Self-pay | Admitting: Family Medicine

## 2007-04-27 ENCOUNTER — Ambulatory Visit: Payer: Self-pay | Admitting: Family Medicine

## 2007-04-29 ENCOUNTER — Encounter (INDEPENDENT_AMBULATORY_CARE_PROVIDER_SITE_OTHER): Payer: Self-pay | Admitting: Family Medicine

## 2007-05-07 ENCOUNTER — Encounter (INDEPENDENT_AMBULATORY_CARE_PROVIDER_SITE_OTHER): Payer: Self-pay | Admitting: Family Medicine

## 2007-05-18 ENCOUNTER — Encounter (INDEPENDENT_AMBULATORY_CARE_PROVIDER_SITE_OTHER): Payer: Self-pay | Admitting: Family Medicine

## 2007-05-26 ENCOUNTER — Ambulatory Visit (HOSPITAL_COMMUNITY): Payer: Self-pay | Admitting: Psychiatry

## 2007-06-02 ENCOUNTER — Ambulatory Visit: Payer: Self-pay | Admitting: Family Medicine

## 2007-06-09 ENCOUNTER — Telehealth (INDEPENDENT_AMBULATORY_CARE_PROVIDER_SITE_OTHER): Payer: Self-pay | Admitting: Family Medicine

## 2007-06-15 ENCOUNTER — Ambulatory Visit: Payer: Self-pay | Admitting: Family Medicine

## 2007-06-18 ENCOUNTER — Encounter (INDEPENDENT_AMBULATORY_CARE_PROVIDER_SITE_OTHER): Payer: Self-pay | Admitting: Family Medicine

## 2007-06-23 ENCOUNTER — Encounter (INDEPENDENT_AMBULATORY_CARE_PROVIDER_SITE_OTHER): Payer: Self-pay | Admitting: Family Medicine

## 2007-06-25 ENCOUNTER — Encounter (INDEPENDENT_AMBULATORY_CARE_PROVIDER_SITE_OTHER): Payer: Self-pay | Admitting: Family Medicine

## 2007-06-29 ENCOUNTER — Encounter (INDEPENDENT_AMBULATORY_CARE_PROVIDER_SITE_OTHER): Payer: Self-pay | Admitting: Family Medicine

## 2007-07-17 ENCOUNTER — Telehealth (INDEPENDENT_AMBULATORY_CARE_PROVIDER_SITE_OTHER): Payer: Self-pay | Admitting: *Deleted

## 2007-07-20 ENCOUNTER — Ambulatory Visit: Payer: Self-pay | Admitting: Family Medicine

## 2007-07-23 ENCOUNTER — Ambulatory Visit (HOSPITAL_COMMUNITY): Payer: Self-pay | Admitting: Psychiatry

## 2007-08-24 ENCOUNTER — Encounter (INDEPENDENT_AMBULATORY_CARE_PROVIDER_SITE_OTHER): Payer: Self-pay | Admitting: Family Medicine

## 2007-08-25 ENCOUNTER — Telehealth (INDEPENDENT_AMBULATORY_CARE_PROVIDER_SITE_OTHER): Payer: Self-pay | Admitting: *Deleted

## 2007-08-25 LAB — CONVERTED CEMR LAB
Albumin: 4.1 g/dL (ref 3.5–5.2)
Alkaline Phosphatase: 66 units/L (ref 39–117)
BUN: 25 mg/dL — ABNORMAL HIGH (ref 6–23)
CO2: 26 meq/L (ref 19–32)
Cholesterol: 140 mg/dL (ref 0–200)
Eosinophils Absolute: 0.2 10*3/uL (ref 0.0–0.7)
Eosinophils Relative: 3 % (ref 0–5)
Glucose, Bld: 102 mg/dL — ABNORMAL HIGH (ref 70–99)
HCT: 42.5 % (ref 39.0–52.0)
HDL: 55 mg/dL (ref >39)
Hemoglobin: 13.6 g/dL (ref 13.0–17.0)
LDL Cholesterol: 73 mg/dL (ref 0–99)
Lymphocytes Relative: 33 % (ref 12–46)
Lymphs Abs: 1.8 10*3/uL (ref 0.7–4.0)
MCV: 93.4 fL (ref 78.0–100.0)
Monocytes Relative: 9 % (ref 3–12)
RBC: 4.55 M/uL (ref 4.22–5.81)
Total Bilirubin: 0.3 mg/dL (ref 0.3–1.2)
Total Protein: 7.6 g/dL (ref 6.0–8.3)
Triglycerides: 59 mg/dL (ref <150)
WBC: 5.5 10*3/uL (ref 4.0–10.5)

## 2007-08-28 ENCOUNTER — Encounter (INDEPENDENT_AMBULATORY_CARE_PROVIDER_SITE_OTHER): Payer: Self-pay | Admitting: Family Medicine

## 2007-08-31 ENCOUNTER — Telehealth (INDEPENDENT_AMBULATORY_CARE_PROVIDER_SITE_OTHER): Payer: Self-pay | Admitting: *Deleted

## 2007-08-31 ENCOUNTER — Ambulatory Visit: Payer: Self-pay | Admitting: Family Medicine

## 2007-08-31 DIAGNOSIS — E039 Hypothyroidism, unspecified: Secondary | ICD-10-CM | POA: Insufficient documentation

## 2007-09-15 ENCOUNTER — Encounter (INDEPENDENT_AMBULATORY_CARE_PROVIDER_SITE_OTHER): Payer: Self-pay | Admitting: Family Medicine

## 2007-09-22 ENCOUNTER — Ambulatory Visit: Payer: Self-pay | Admitting: Family Medicine

## 2007-09-29 ENCOUNTER — Encounter (INDEPENDENT_AMBULATORY_CARE_PROVIDER_SITE_OTHER): Payer: Self-pay | Admitting: Family Medicine

## 2007-10-02 ENCOUNTER — Ambulatory Visit: Payer: Self-pay | Admitting: Family Medicine

## 2007-10-02 LAB — CONVERTED CEMR LAB
Glucose, Urine, Semiquant: NEGATIVE
Ketones, urine, test strip: NEGATIVE
Nitrite: NEGATIVE
Protein, U semiquant: 30
Specific Gravity, Urine: 1.025
WBC Urine, dipstick: NEGATIVE

## 2007-10-03 ENCOUNTER — Encounter (INDEPENDENT_AMBULATORY_CARE_PROVIDER_SITE_OTHER): Payer: Self-pay | Admitting: Family Medicine

## 2007-10-05 ENCOUNTER — Telehealth (INDEPENDENT_AMBULATORY_CARE_PROVIDER_SITE_OTHER): Payer: Self-pay | Admitting: Family Medicine

## 2007-10-05 LAB — CONVERTED CEMR LAB
AST: 19 units/L (ref 0–37)
Albumin: 4.3 g/dL (ref 3.5–5.2)
Alkaline Phosphatase: 57 units/L (ref 39–117)
BUN: 22 mg/dL (ref 6–23)
Basophils Relative: 1 % (ref 0–1)
Creatinine, Ser: 1.23 mg/dL (ref 0.40–1.50)
Eosinophils Absolute: 0.1 10*3/uL (ref 0.0–0.7)
Eosinophils Relative: 3 % (ref 0–5)
Glucose, Bld: 97 mg/dL (ref 70–99)
HCT: 40.5 % (ref 39.0–52.0)
Hemoglobin: 12.8 g/dL — ABNORMAL LOW (ref 13.0–17.0)
Lymphs Abs: 1.5 10*3/uL (ref 0.7–4.0)
MCHC: 31.6 g/dL (ref 30.0–36.0)
MCV: 91.2 fL (ref 78.0–100.0)
Monocytes Absolute: 0.4 10*3/uL (ref 0.1–1.0)
Monocytes Relative: 10 % (ref 3–12)
Neutrophils Relative %: 53 % (ref 43–77)
Potassium: 5.1 meq/L (ref 3.5–5.3)
RBC: 4.44 M/uL (ref 4.22–5.81)
TSH: 1.863 microintl units/mL (ref 0.350–5.50)
Total Bilirubin: 0.4 mg/dL (ref 0.3–1.2)
WBC: 4.4 10*3/uL (ref 4.0–10.5)

## 2007-10-23 ENCOUNTER — Encounter (INDEPENDENT_AMBULATORY_CARE_PROVIDER_SITE_OTHER): Payer: Self-pay | Admitting: Family Medicine

## 2007-10-28 ENCOUNTER — Telehealth (INDEPENDENT_AMBULATORY_CARE_PROVIDER_SITE_OTHER): Payer: Self-pay | Admitting: Family Medicine

## 2007-10-29 ENCOUNTER — Encounter (INDEPENDENT_AMBULATORY_CARE_PROVIDER_SITE_OTHER): Payer: Self-pay | Admitting: Family Medicine

## 2007-10-30 ENCOUNTER — Ambulatory Visit: Payer: Self-pay | Admitting: Family Medicine

## 2007-11-03 ENCOUNTER — Ambulatory Visit (HOSPITAL_COMMUNITY): Payer: Self-pay | Admitting: Psychiatry

## 2007-11-05 ENCOUNTER — Ambulatory Visit: Payer: Self-pay | Admitting: Family Medicine

## 2007-11-09 ENCOUNTER — Encounter (INDEPENDENT_AMBULATORY_CARE_PROVIDER_SITE_OTHER): Payer: Self-pay | Admitting: Family Medicine

## 2007-11-12 ENCOUNTER — Telehealth (INDEPENDENT_AMBULATORY_CARE_PROVIDER_SITE_OTHER): Payer: Self-pay | Admitting: *Deleted

## 2007-11-12 ENCOUNTER — Encounter (INDEPENDENT_AMBULATORY_CARE_PROVIDER_SITE_OTHER): Payer: Self-pay | Admitting: Family Medicine

## 2007-11-16 ENCOUNTER — Telehealth (INDEPENDENT_AMBULATORY_CARE_PROVIDER_SITE_OTHER): Payer: Self-pay | Admitting: Family Medicine

## 2007-11-16 ENCOUNTER — Emergency Department (HOSPITAL_COMMUNITY): Admission: EM | Admit: 2007-11-16 | Discharge: 2007-11-17 | Payer: Self-pay | Admitting: Emergency Medicine

## 2007-11-17 ENCOUNTER — Encounter (INDEPENDENT_AMBULATORY_CARE_PROVIDER_SITE_OTHER): Payer: Self-pay | Admitting: Family Medicine

## 2007-11-19 ENCOUNTER — Ambulatory Visit: Payer: Self-pay | Admitting: Family Medicine

## 2007-11-26 ENCOUNTER — Encounter (INDEPENDENT_AMBULATORY_CARE_PROVIDER_SITE_OTHER): Payer: Self-pay | Admitting: Family Medicine

## 2007-11-30 ENCOUNTER — Ambulatory Visit: Payer: Self-pay | Admitting: Family Medicine

## 2007-12-01 ENCOUNTER — Telehealth (INDEPENDENT_AMBULATORY_CARE_PROVIDER_SITE_OTHER): Payer: Self-pay | Admitting: *Deleted

## 2007-12-01 ENCOUNTER — Emergency Department (HOSPITAL_COMMUNITY): Admission: EM | Admit: 2007-12-01 | Discharge: 2007-12-01 | Payer: Self-pay | Admitting: Emergency Medicine

## 2007-12-03 ENCOUNTER — Ambulatory Visit: Payer: Self-pay | Admitting: Family Medicine

## 2007-12-03 DIAGNOSIS — H9319 Tinnitus, unspecified ear: Secondary | ICD-10-CM | POA: Insufficient documentation

## 2007-12-03 DIAGNOSIS — R42 Dizziness and giddiness: Secondary | ICD-10-CM | POA: Insufficient documentation

## 2007-12-07 ENCOUNTER — Ambulatory Visit: Payer: Self-pay | Admitting: Family Medicine

## 2007-12-10 ENCOUNTER — Telehealth (INDEPENDENT_AMBULATORY_CARE_PROVIDER_SITE_OTHER): Payer: Self-pay | Admitting: Family Medicine

## 2007-12-20 ENCOUNTER — Encounter (INDEPENDENT_AMBULATORY_CARE_PROVIDER_SITE_OTHER): Payer: Self-pay | Admitting: Family Medicine

## 2007-12-24 ENCOUNTER — Ambulatory Visit: Payer: Self-pay | Admitting: Family Medicine

## 2007-12-29 ENCOUNTER — Ambulatory Visit (HOSPITAL_COMMUNITY): Payer: Self-pay | Admitting: Psychiatry

## 2008-01-01 ENCOUNTER — Telehealth (INDEPENDENT_AMBULATORY_CARE_PROVIDER_SITE_OTHER): Payer: Self-pay | Admitting: Family Medicine

## 2008-01-05 ENCOUNTER — Telehealth (INDEPENDENT_AMBULATORY_CARE_PROVIDER_SITE_OTHER): Payer: Self-pay | Admitting: *Deleted

## 2008-01-21 ENCOUNTER — Ambulatory Visit: Payer: Self-pay | Admitting: Family Medicine

## 2008-01-21 DIAGNOSIS — R1319 Other dysphagia: Secondary | ICD-10-CM

## 2008-01-22 ENCOUNTER — Encounter (INDEPENDENT_AMBULATORY_CARE_PROVIDER_SITE_OTHER): Payer: Self-pay | Admitting: Family Medicine

## 2008-01-25 ENCOUNTER — Encounter (INDEPENDENT_AMBULATORY_CARE_PROVIDER_SITE_OTHER): Payer: Self-pay | Admitting: Family Medicine

## 2008-01-26 LAB — CONVERTED CEMR LAB
ALT: 9 units/L (ref 0–53)
AST: 20 units/L (ref 0–37)
CO2: 21 meq/L (ref 19–32)
Creatinine, Ser: 1.32 mg/dL (ref 0.40–1.50)
Total Bilirubin: 0.3 mg/dL (ref 0.3–1.2)

## 2008-02-02 ENCOUNTER — Ambulatory Visit: Payer: Self-pay | Admitting: Family Medicine

## 2008-02-04 ENCOUNTER — Ambulatory Visit: Payer: Self-pay | Admitting: Internal Medicine

## 2008-02-12 ENCOUNTER — Encounter (INDEPENDENT_AMBULATORY_CARE_PROVIDER_SITE_OTHER): Payer: Self-pay | Admitting: Family Medicine

## 2008-02-12 ENCOUNTER — Telehealth (INDEPENDENT_AMBULATORY_CARE_PROVIDER_SITE_OTHER): Payer: Self-pay | Admitting: Family Medicine

## 2008-02-18 ENCOUNTER — Ambulatory Visit: Payer: Self-pay | Admitting: Family Medicine

## 2008-02-25 ENCOUNTER — Ambulatory Visit (HOSPITAL_COMMUNITY): Payer: Self-pay | Admitting: Psychiatry

## 2008-04-05 ENCOUNTER — Telehealth (INDEPENDENT_AMBULATORY_CARE_PROVIDER_SITE_OTHER): Payer: Self-pay | Admitting: *Deleted

## 2008-04-21 ENCOUNTER — Ambulatory Visit: Payer: Self-pay | Admitting: Family Medicine

## 2008-04-21 DIAGNOSIS — R609 Edema, unspecified: Secondary | ICD-10-CM

## 2008-04-25 ENCOUNTER — Encounter (INDEPENDENT_AMBULATORY_CARE_PROVIDER_SITE_OTHER): Payer: Self-pay | Admitting: Family Medicine

## 2008-05-02 ENCOUNTER — Encounter: Payer: Self-pay | Admitting: Internal Medicine

## 2008-05-10 ENCOUNTER — Telehealth (INDEPENDENT_AMBULATORY_CARE_PROVIDER_SITE_OTHER): Payer: Self-pay | Admitting: *Deleted

## 2008-05-19 ENCOUNTER — Telehealth (INDEPENDENT_AMBULATORY_CARE_PROVIDER_SITE_OTHER): Payer: Self-pay | Admitting: *Deleted

## 2008-05-26 ENCOUNTER — Ambulatory Visit (HOSPITAL_COMMUNITY): Payer: Self-pay | Admitting: Psychiatry

## 2008-06-02 ENCOUNTER — Ambulatory Visit: Payer: Self-pay | Admitting: Family Medicine

## 2008-06-03 ENCOUNTER — Encounter (INDEPENDENT_AMBULATORY_CARE_PROVIDER_SITE_OTHER): Payer: Self-pay | Admitting: Family Medicine

## 2008-06-03 LAB — CONVERTED CEMR LAB
Alkaline Phosphatase: 70 units/L (ref 39–117)
BUN: 18 mg/dL (ref 6–23)
Creatinine, Ser: 1.2 mg/dL (ref 0.40–1.50)
Eosinophils Absolute: 0.1 10*3/uL (ref 0.0–0.7)
Eosinophils Relative: 2 % (ref 0–5)
Glucose, Bld: 81 mg/dL (ref 70–99)
HCT: 41.6 % (ref 39.0–52.0)
Hemoglobin: 13.4 g/dL (ref 13.0–17.0)
Lymphs Abs: 1.8 10*3/uL (ref 0.7–4.0)
MCV: 91 fL (ref 78.0–100.0)
Monocytes Absolute: 0.7 10*3/uL (ref 0.1–1.0)
Monocytes Relative: 11 % (ref 3–12)
Platelets: 252 10*3/uL (ref 150–400)
Sodium: 141 meq/L (ref 135–145)
TSH: 3.82 microintl units/mL (ref 0.350–4.50)
Total Bilirubin: 0.3 mg/dL (ref 0.3–1.2)
WBC: 6.6 10*3/uL (ref 4.0–10.5)

## 2008-07-07 ENCOUNTER — Telehealth (INDEPENDENT_AMBULATORY_CARE_PROVIDER_SITE_OTHER): Payer: Self-pay | Admitting: Family Medicine

## 2008-07-12 ENCOUNTER — Ambulatory Visit: Payer: Self-pay | Admitting: Family Medicine

## 2008-07-12 ENCOUNTER — Telehealth (INDEPENDENT_AMBULATORY_CARE_PROVIDER_SITE_OTHER): Payer: Self-pay | Admitting: *Deleted

## 2008-07-25 ENCOUNTER — Telehealth (INDEPENDENT_AMBULATORY_CARE_PROVIDER_SITE_OTHER): Payer: Self-pay | Admitting: *Deleted

## 2008-08-01 ENCOUNTER — Ambulatory Visit: Payer: Self-pay | Admitting: Family Medicine

## 2008-08-02 ENCOUNTER — Telehealth (INDEPENDENT_AMBULATORY_CARE_PROVIDER_SITE_OTHER): Payer: Self-pay | Admitting: Family Medicine

## 2008-08-03 ENCOUNTER — Encounter (INDEPENDENT_AMBULATORY_CARE_PROVIDER_SITE_OTHER): Payer: Self-pay | Admitting: Family Medicine

## 2008-08-14 ENCOUNTER — Encounter (INDEPENDENT_AMBULATORY_CARE_PROVIDER_SITE_OTHER): Payer: Self-pay | Admitting: Family Medicine

## 2008-08-15 ENCOUNTER — Ambulatory Visit: Payer: Self-pay | Admitting: Family Medicine

## 2008-08-22 ENCOUNTER — Ambulatory Visit: Payer: Self-pay | Admitting: Family Medicine

## 2008-08-23 ENCOUNTER — Ambulatory Visit (HOSPITAL_COMMUNITY): Payer: Self-pay | Admitting: Psychiatry

## 2008-08-31 ENCOUNTER — Encounter: Payer: Self-pay | Admitting: Internal Medicine

## 2008-09-05 ENCOUNTER — Ambulatory Visit: Payer: Self-pay | Admitting: Family Medicine

## 2008-09-06 ENCOUNTER — Encounter (INDEPENDENT_AMBULATORY_CARE_PROVIDER_SITE_OTHER): Payer: Self-pay | Admitting: Family Medicine

## 2008-09-15 ENCOUNTER — Encounter (INDEPENDENT_AMBULATORY_CARE_PROVIDER_SITE_OTHER): Payer: Self-pay | Admitting: Family Medicine

## 2008-09-23 ENCOUNTER — Telehealth (INDEPENDENT_AMBULATORY_CARE_PROVIDER_SITE_OTHER): Payer: Self-pay | Admitting: Family Medicine

## 2008-09-27 ENCOUNTER — Encounter (INDEPENDENT_AMBULATORY_CARE_PROVIDER_SITE_OTHER): Payer: Self-pay | Admitting: Family Medicine

## 2008-09-29 ENCOUNTER — Encounter (INDEPENDENT_AMBULATORY_CARE_PROVIDER_SITE_OTHER): Payer: Self-pay | Admitting: Family Medicine

## 2008-09-30 ENCOUNTER — Encounter (INDEPENDENT_AMBULATORY_CARE_PROVIDER_SITE_OTHER): Payer: Self-pay | Admitting: Family Medicine

## 2008-10-07 ENCOUNTER — Ambulatory Visit: Payer: Self-pay | Admitting: Family Medicine

## 2008-10-12 ENCOUNTER — Encounter (INDEPENDENT_AMBULATORY_CARE_PROVIDER_SITE_OTHER): Payer: Self-pay | Admitting: Family Medicine

## 2008-10-14 ENCOUNTER — Encounter (INDEPENDENT_AMBULATORY_CARE_PROVIDER_SITE_OTHER): Payer: Self-pay | Admitting: Family Medicine

## 2008-10-19 LAB — CONVERTED CEMR LAB
ALT: <8 units/L (ref 0–53)
AST: 15 units/L (ref 0–37)
Albumin: 3.9 g/dL (ref 3.5–5.2)
BUN: 11 mg/dL (ref 6–23)
CO2: 25 meq/L (ref 19–32)
Calcium: 9.4 mg/dL (ref 8.4–10.5)
Chloride: 108 meq/L (ref 96–112)
Cholesterol: 158 mg/dL (ref 0–200)
Creatinine, Ser: 1.26 mg/dL (ref 0.40–1.50)
Eosinophils Absolute: 0 10*3/uL (ref 0.0–0.7)
Eosinophils Relative: 0 % (ref 0–5)
HCT: 39.6 % (ref 39.0–52.0)
HDL: 51 mg/dL (ref >39)
Hemoglobin: 13.1 g/dL (ref 13.0–17.0)
Lymphocytes Relative: 39 % (ref 12–46)
Lymphs Abs: 1.8 10*3/uL (ref 0.7–4.0)
MCV: 91.2 fL (ref 78.0–100.0)
Monocytes Relative: 13 % — ABNORMAL HIGH (ref 3–12)
PSA: 2.01 ng/mL (ref 0.10–4.00)
Potassium: 4.6 meq/L (ref 3.5–5.3)
RBC: 4.34 M/uL (ref 4.22–5.81)
TSH: 7.072 microintl units/mL — ABNORMAL HIGH (ref 0.350–4.500)
Total CHOL/HDL Ratio: 3.1
WBC: 4.6 10*3/uL (ref 4.0–10.5)

## 2008-10-21 ENCOUNTER — Encounter (INDEPENDENT_AMBULATORY_CARE_PROVIDER_SITE_OTHER): Payer: Self-pay | Admitting: Family Medicine

## 2008-11-04 ENCOUNTER — Ambulatory Visit: Payer: Self-pay | Admitting: Family Medicine

## 2008-11-09 ENCOUNTER — Encounter (INDEPENDENT_AMBULATORY_CARE_PROVIDER_SITE_OTHER): Payer: Self-pay | Admitting: Family Medicine

## 2008-11-22 ENCOUNTER — Ambulatory Visit (HOSPITAL_COMMUNITY): Payer: Self-pay | Admitting: Psychiatry

## 2008-11-30 ENCOUNTER — Encounter (INDEPENDENT_AMBULATORY_CARE_PROVIDER_SITE_OTHER): Payer: Self-pay | Admitting: Family Medicine

## 2008-12-16 ENCOUNTER — Ambulatory Visit: Payer: Self-pay | Admitting: Family Medicine

## 2008-12-28 ENCOUNTER — Encounter (INDEPENDENT_AMBULATORY_CARE_PROVIDER_SITE_OTHER): Payer: Self-pay | Admitting: Family Medicine

## 2009-01-13 ENCOUNTER — Ambulatory Visit: Payer: Self-pay | Admitting: Family Medicine

## 2009-01-16 ENCOUNTER — Encounter (INDEPENDENT_AMBULATORY_CARE_PROVIDER_SITE_OTHER): Payer: Self-pay | Admitting: Family Medicine

## 2009-01-18 ENCOUNTER — Encounter (INDEPENDENT_AMBULATORY_CARE_PROVIDER_SITE_OTHER): Payer: Self-pay | Admitting: Family Medicine

## 2009-02-23 ENCOUNTER — Ambulatory Visit (HOSPITAL_COMMUNITY): Payer: Self-pay | Admitting: Psychiatry

## 2009-05-25 ENCOUNTER — Ambulatory Visit (HOSPITAL_COMMUNITY): Payer: Self-pay | Admitting: Psychiatry

## 2009-06-21 ENCOUNTER — Encounter (INDEPENDENT_AMBULATORY_CARE_PROVIDER_SITE_OTHER): Payer: Self-pay

## 2009-08-07 ENCOUNTER — Ambulatory Visit: Payer: Self-pay | Admitting: Internal Medicine

## 2009-08-07 DIAGNOSIS — Z860100 Personal history of colon polyps, unspecified: Secondary | ICD-10-CM | POA: Insufficient documentation

## 2009-08-31 ENCOUNTER — Ambulatory Visit (HOSPITAL_COMMUNITY): Payer: Self-pay | Admitting: Psychiatry

## 2009-09-06 ENCOUNTER — Ambulatory Visit (HOSPITAL_COMMUNITY): Admission: RE | Admit: 2009-09-06 | Discharge: 2009-09-06 | Payer: Self-pay | Admitting: Internal Medicine

## 2009-09-06 ENCOUNTER — Ambulatory Visit: Payer: Self-pay | Admitting: Internal Medicine

## 2009-09-06 HISTORY — PX: ESOPHAGOGASTRODUODENOSCOPY: SHX1529

## 2009-09-06 HISTORY — PX: COLONOSCOPY: SHX174

## 2009-09-11 ENCOUNTER — Encounter: Payer: Self-pay | Admitting: Urgent Care

## 2009-11-21 ENCOUNTER — Ambulatory Visit (HOSPITAL_COMMUNITY): Payer: Self-pay | Admitting: Psychiatry

## 2010-01-02 ENCOUNTER — Encounter: Admission: RE | Admit: 2010-01-02 | Discharge: 2010-01-02 | Payer: Self-pay | Admitting: Neurology

## 2010-02-13 ENCOUNTER — Ambulatory Visit (HOSPITAL_COMMUNITY): Payer: Self-pay | Admitting: Psychiatry

## 2010-04-13 ENCOUNTER — Encounter: Payer: Self-pay | Admitting: Family Medicine

## 2010-05-15 ENCOUNTER — Ambulatory Visit (HOSPITAL_COMMUNITY)
Admission: RE | Admit: 2010-05-15 | Discharge: 2010-05-15 | Payer: Self-pay | Source: Home / Self Care | Attending: Psychiatry | Admitting: Psychiatry

## 2010-05-27 ENCOUNTER — Encounter: Payer: Self-pay | Admitting: Gastroenterology

## 2010-06-07 NOTE — Letter (Signed)
Summary: chart review  chart review   Imported By: Lind Guest 04/13/2010 14:50:33  _____________________________________________________________________  External Attachment:    Type:   Image     Comment:   External Document

## 2010-06-07 NOTE — Letter (Signed)
Summary: TCS ORDER  TCS ORDER   Imported By: Ave Filter 08/07/2009 15:29:03  _____________________________________________________________________  External Attachment:    Type:   Image     Comment:   External Document

## 2010-06-07 NOTE — Letter (Signed)
Summary: Recall Colonoscopy/Endoscopy, Change to Office Visit  [facility]  [address]   [place], [place] [address]   Phone: [phone]  Fax: [phone]      June 21, 2009   NORMAL RECINOS [address] [place], [place]  [address] [birth year]   Dear Mr. MASTON,   According to our records, it is time for you to schedule a Colonoscopy. However, after reviewing your medical record, we recommend an office visit in order to determine your need for a repeat procedure.  Please call [phone] at your convenience to schedule an office visit. If you have any questions or concerns, please feel free to contact our office.   Sincerely,   Cloria Spring LPN  [facility] Ph: [phone]   Fax: [phone]

## 2010-06-07 NOTE — Assessment & Plan Note (Signed)
Summary: ov to determine if TCS needed- cdg   Primary Care Provider:  Dr Catalina Pizza  Chief Complaint:  ov to see if tcs is needed.  History of Present Illness: 65 y/o caucasian male w/ hx Parkinsons and chronic constipation.  Hx colonic adenomas due for colonoscopy.  c/o dysphagia w/ solids & liquids.  c/o regurgitation.  c/o bilat lower abd pain intermittant, resolves w/ BM.  Taking miralax daily.  Last EGD with esophageal dilatation 02/2006->56-French Maloney dilatation, 4-quadrant distal esophageal erosions, consistent with moderately-severe erosive reflux esophagitis, otherwise normal tubular esophagus, normal stomach, normal D-I and D-II.  He has been off PPI x 6 months due to change in PCP.      Current Problems (verified): 1)  Colonic Polyps, Adenomatous, Hx of  (ICD-V12.72) 2)  Hypoxemia - No Sleep Apnea - Nocturnal  (ICD-799.02) 3)  Peripheral Edema  (ICD-782.3) 4)  Other Dysphagia  (ICD-787.29) 5)  Tinnitus, Chronic  (ICD-388.30) 6)  Dizziness  (ICD-780.4) 7)  Hypothyroidism  (ICD-244.9) 8)  Degenerative Disc Disease, Lumbar Spine  (ICD-722.52) 9)  Hyperlipidemia  (ICD-272.4) 10)  Cholelithiasis  (ICD-574.20) 11)  Actinic Keratosis  (ICD-702.0) 12)  Constipation, Chronic  (ICD-564.09) 13)  Disorder, Bipolar Nos  (ICD-296.80) 14)  Parkinson's Disease  (ICD-332.0) 15)  Arthritis  (ICD-716.90) 16)  Hyprtrphy Prostate Bng w/o Urinary Obst/luts  (ICD-600.00) 17)  Overactive Bladder  (ICD-596.51) 18)  Incontinence  (ICD-788.30) 19)  Transient Ischemic Attack, Hx of  (ICD-V12.50) 20)  Low Back Pain  (ICD-724.2) 21)  Hypertension  (ICD-401.9) 22)  Gerd  (ICD-530.81) 23)  Depression  (ICD-311) 24)  COPD  (ICD-496) 25)  Anxiety  (ICD-300.00)  Current Medications (verified): 1)  Carbidopa-Levodopa 25-100 Mg Tabs (Carbidopa-Levodopa) .... One Pill At 7 Am, 1/2 At 11am, 1/2 At [facility] and Half At 3 Pm, 5 Pm  and 1/2 Pill At Bedtime - Per Dr. Sandria Manly 2)  Clonazepam 1 Mg Tabs  (Clonazepam) .... 1/2 Two Times A Day 3)  Aspirin 81 Mg Tbec (Aspirin) .... Once Daily 4)  Zoloft 100 Mg Tabs (Sertraline Hcl) .... Take Two At Bedtime - Dr. Lolly Mustache 5)  Seroquel 50 Mg Tabs (Quetiapine Fumarate) .... 200 Mg At [facility] and 500 Mg At Bedtime - Dr. Lolly Mustache 6)  Tramadol Hcl 50 Mg Tabs (Tramadol Hcl) .... One By Mouth Every 8 Hours As Needed - Dr. Sandria Manly 7)  Lamictal 150 Mg Tabs (Lamotrigine) .... One Daily At 11 Am - Dr. Lolly Mustache 8)  Pravachol 20 Mg Tabs (Pravastatin Sodium) .... One Daily 9)  Amitiza 24 Mcg Caps (Lubiprostone) .... Two Times A Day 10)  Robaxin 500 Mg Tabs (Methocarbamol) .... Three Times A Day 11)  Lidoderm 5 %  Ptch (Lidocaine) .... Apply One Patch  To L - Spine Daily - On For 12 Hours Then Off As Needed 12)  Synthroid 50 Mcg Tabs (Levothyroxine Sodium) .... Take 1 Tablet By Mouth Once A Day 13)  Benztropine Mesylate 1 Mg  Tabs (Benztropine Mesylate) .... Three Times Daily - Dr. Sandria Manly 14)  Miralax   Powd (Polyethylene Glycol 3350) .Marland KitchenMarland KitchenMarland Kitchen 17 G in 8 Oz of H2o Daily 15)  Amlodipine Besylate 5 Mg Tabs (Amlodipine Besylate) .... Half By Mouth Daily 16)  Baclofen 10 Mg Tabs (Baclofen) .... Qid 17)  Mesodin 50mg  .... 1/2 Three Times A Day 18)  Omeprazole 20 Mg Cpdr (Omeprazole) .... One By Mouth Two Times A Day For Acid Reflux For At Least One Month, Then Once Daily  Allergies (verified): 1)  !  Haldol 2)  ! * Mirapex 3)  ! * Lyrica  Past History:  Past Medical History: HYPOXEMIA - NO SLEEP APNEA - NOCTURNAL (ICD-799.02) PERIPHERAL EDEMA (ICD-782.3) OTHER DYSPHAGIA (ICD-787.29) TINNITUS, CHRONIC (ICD-388.30) DIZZINESS (ICD-780.4) HYPOTHYROIDISM (ICD-244.9) DEGENERATIVE DISC DISEASE, LUMBAR SPINE (ICD-722.52) HYPERLIPIDEMIA (ICD-272.4) CHOLELITHIASIS (ICD-574.20) ACTINIC KERATOSIS (ICD-702.0) CONSTIPATION, CHRONIC (ICD-564.09) DISORDER, BIPOLAR NOS (ICD-296.80) PARKINSON'S DISEASE (ICD-332.0) hx etohism  Hx adenomatous polyps colonoscopy 05/2006 ARTHRITIS  (ICD-716.90) HYPRTRPHY PROSTATE BNG W/O URINARY OBST/LUTS (ICD-600.00) OVERACTIVE BLADDER (ICD-596.51) INCONTINENCE (ICD-788.30) TRANSIENT ISCHEMIC ATTACK, HX OF (ICD-V12.50) LOW BACK PAIN (ICD-724.2) HYPERTENSION (ICD-401.9) GERD (ICD-530.81) erosive reflux esophagitis (see HPI) DEPRESSION (ICD-311) COPD (ICD-496) ANXIETY (ICD-300.00) biliary dyskinesia   Normal Stress Test April 2008  Family History: No known family history of colorectal carcinoma, IBD, liver or chronic GI problems.  Father: Dead  43 CAD/CHF  Mother: Dead 67 Alzheimers Siblings: Sister 59 CVA, HTN and anxiety, bipolar Kids - Had one he knew about - walked out on mom. Little boy. Does not know much hx but knows born Sept 2, 1972.  Review of Systems General:  Complains of fatigue, weakness, and malaise; denies fever, chills, sweats, anorexia, weight loss, and sleep disorder. CV:  Denies chest pains, angina, palpitations, syncope, dyspnea on exertion, orthopnea, PND, peripheral edema, and claudication. Resp:  Denies dyspnea at rest, dyspnea with exercise, cough, sputum, wheezing, coughing up blood, and pleurisy. GI:  Complains of difficulty swallowing, nausea, and indigestion/heartburn; denies pain on swallowing, vomiting, vomiting blood, jaundice, bloody BM's, and black BMs. GU:  Denies urinary burning, blood in urine, urinary frequency, urinary hesitancy, nocturnal urination, and urinary incontinence. Derm:  Denies rash, itching, dry skin, hives, moles, warts, and unhealing ulcers; oily. Psych:  Denies depression, anxiety, memory loss, suicidal ideation, hallucinations, paranoia, phobia, and confusion. Heme:  Denies bruising, bleeding, and enlarged lymph nodes.  Vital Signs:  Patient profile:   65 year old male Height:      73 inches Temp:     97.9 degrees F oral Pulse rate:   84 / minute BP sitting:   122 / 78  (right arm) Cuff size:   regular  Vitals Entered By: Hendricks Limes LPN (August 07, 9560 2:34  PM)  Physical Exam  General:  Wheelchair bound, chronically-ill -appearing.  NAD Head:  Normocephalic and atraumatic. Eyes:  Sclera clear, non-icteric. Ears:  Normal auditory acuity. Mouth:  No deformity or lesions, dentition normal. Neck:  Supple; no masses or thyromegaly. Lungs:  Clear throughout to auscultation. Heart:  Regular rate and rhythm; no murmurs, rubs,  or bruits. Abdomen:  Soft, nontender and nondistended. No masses, hepatosplenomegaly or hernias noted. Normal bowel sounds.without guarding and without rebound.   Msk:  Symmetrical with no gross deformities. Normal posture. Pulses:  Normal pulses noted. Extremities:  No clubbing, cyanosis, edema or deformities noted. Neurologic:  Alert and  oriented x4;  grossly normal neurologically. Skin:  Intact without significant lesions or rashes. Cervical Nodes:  No significant cervical adenopathy. Psych:  Alert and cooperative. Normal mood and affect.confused at times  Impression & Recommendations:  Problem # 1:  COLONIC POLYPS, ADENOMATOUS, HX OF (ICD-V12.72) Due for surveillance  Colonoscopy to be performed by Dr. Suszanne Conners Rourk in the near future.  If no reilef from dysphagia w/ PPI trial for 2 weeks, will purse EGD w/ possible esophageal dilatation.  I have discussed risks and benefits which include, but are not limited to, bleeding, infection, perforation, or medication reaction.  The patient agrees with this plan and consent will be obtained.  Orders: Est. Patient Level IV (34742)  Problem # 2:  OTHER DYSPHAGIA (ICD-787.29) Recurrent, off PPI x 6 months.  ?motility disorder secondary to Parkinson's, GERD, web, ring, or stricture. For EGD w/ poss ED if no relief from PPI trial.  Problem # 3:  CONSTIPATION, CHRONIC (ICD-564.09) Well-controlled on Miralax  Patient Instructions: 1)  Call in 2 weeks w progress report.  If swallowing no better, will add EGD w/ esophageal dilatation. 2)  We will need to hold Aspirin if we  dilate esophagus. Prescriptions: OMEPRAZOLE 20 MG CPDR (OMEPRAZOLE) one by mouth two times a day for acid reflux for at least one month, then once daily  #60 x 0   Entered and Authorized by:   Joselyn Arrow FNP-BC   Signed by:   Joselyn Arrow FNP-BC on 08/07/2009   Method used:   Electronically to        [facility] (retail)       [address]       [place], [place]  [address]       Ph: [phone]       Fax: [phone]   RxID:   [id number]

## 2010-06-07 NOTE — Medication Information (Signed)
Summary: Tax adviser   Imported By: Peggyann Shoals 09/11/2009 08:47:39  _____________________________________________________________________  External Attachment:    Type:   Image     Comment:   External Document  Appended Document: RX FolderOMEPRAZOLE    Prescriptions: OMEPRAZOLE 20 MG CPDR (OMEPRAZOLE) one by mouth daily for acid reflux  #31 x 11   Entered and Authorized by:   Joselyn Arrow FNP-BC   Signed by:   Joselyn Arrow FNP-BC on 09/11/2009   Method used:   Electronically to        [facility] (retail)       [address] [place], [place]  [address]       Ph: [phone]       Fax: [phone]   RxID:   [id number] OMEPRAZOLE 20 MG CPDR (OMEPRAZOLE) one by mouth two times a day for acid reflux for at least one month, then once daily  #31 x 11   Entered and Authorized by:   Joselyn Arrow FNP-BC   Signed by:   Joselyn Arrow FNP-BC on 09/11/2009   Method used:   Electronically to        [facility] (retail)       [address]       [place], [place]  [address]       Ph: [phone]       Fax: [phone]   RxID:   [id number]    Disregard 1st rx, it should be once daily

## 2010-06-12 ENCOUNTER — Other Ambulatory Visit (HOSPITAL_COMMUNITY): Payer: Self-pay | Admitting: Internal Medicine

## 2010-06-12 DIAGNOSIS — R609 Edema, unspecified: Secondary | ICD-10-CM

## 2010-06-13 ENCOUNTER — Ambulatory Visit (HOSPITAL_COMMUNITY)
Admission: RE | Admit: 2010-06-13 | Discharge: 2010-06-13 | Disposition: A | Discharge status: Home / Self Care | Payer: MEDICARE | Source: Ambulatory Visit | Attending: Internal Medicine | Admitting: Internal Medicine

## 2010-06-13 DIAGNOSIS — M7989 Other specified soft tissue disorders: Secondary | ICD-10-CM | POA: Insufficient documentation

## 2010-06-13 DIAGNOSIS — R609 Edema, unspecified: Secondary | ICD-10-CM

## 2010-06-13 DIAGNOSIS — M79609 Pain in unspecified limb: Secondary | ICD-10-CM | POA: Insufficient documentation

## 2010-08-03 ENCOUNTER — Inpatient Hospital Stay (HOSPITAL_COMMUNITY)
Admission: EM | Admit: 2010-08-03 | Discharge: 2010-08-07 | DRG: 312 | Disposition: A | Discharge status: Home Health | Payer: Medicare Other | Attending: Internal Medicine | Admitting: Internal Medicine

## 2010-08-03 ENCOUNTER — Emergency Department (HOSPITAL_COMMUNITY): Payer: Medicare Other

## 2010-08-03 DIAGNOSIS — G20 Parkinson's disease: Secondary | ICD-10-CM | POA: Diagnosis present

## 2010-08-03 DIAGNOSIS — G20A1 Parkinson's disease without dyskinesia, without mention of fluctuations: Secondary | ICD-10-CM | POA: Diagnosis present

## 2010-08-03 DIAGNOSIS — T50995A Adverse effect of other drugs, medicaments and biological substances, initial encounter: Secondary | ICD-10-CM | POA: Diagnosis present

## 2010-08-03 DIAGNOSIS — E860 Dehydration: Secondary | ICD-10-CM | POA: Diagnosis present

## 2010-08-03 DIAGNOSIS — I10 Essential (primary) hypertension: Secondary | ICD-10-CM | POA: Diagnosis present

## 2010-08-03 DIAGNOSIS — E039 Hypothyroidism, unspecified: Secondary | ICD-10-CM | POA: Diagnosis present

## 2010-08-03 DIAGNOSIS — Z9981 Dependence on supplemental oxygen: Secondary | ICD-10-CM

## 2010-08-03 DIAGNOSIS — J4489 Other specified chronic obstructive pulmonary disease: Secondary | ICD-10-CM | POA: Diagnosis present

## 2010-08-03 DIAGNOSIS — F319 Bipolar disorder, unspecified: Secondary | ICD-10-CM | POA: Diagnosis present

## 2010-08-03 DIAGNOSIS — I951 Orthostatic hypotension: Principal | ICD-10-CM | POA: Diagnosis present

## 2010-08-03 DIAGNOSIS — J449 Chronic obstructive pulmonary disease, unspecified: Secondary | ICD-10-CM | POA: Diagnosis present

## 2010-08-03 LAB — PROTIME-INR: Prothrombin Time: 14 seconds (ref 11.6–15.2)

## 2010-08-03 LAB — POCT CARDIAC MARKERS
CKMB, poc: 2.8 ng/mL (ref 1.0–8.0)
Myoglobin, poc: 211 ng/mL (ref 12–200)

## 2010-08-03 LAB — COMPREHENSIVE METABOLIC PANEL
BUN: 20 mg/dL (ref 6–23)
CO2: 26 mEq/L (ref 19–32)
Calcium: 9 mg/dL (ref 8.4–10.5)
Chloride: 107 mEq/L (ref 96–112)
Creatinine, Ser: 1.46 mg/dL (ref 0.4–1.5)
GFR calc non Af Amer: 48 mL/min — ABNORMAL LOW (ref >60)
Total Bilirubin: 0.4 mg/dL (ref 0.3–1.2)

## 2010-08-03 LAB — URINALYSIS, ROUTINE W REFLEX MICROSCOPIC
Bilirubin Urine: NEGATIVE
Glucose, UA: NEGATIVE mg/dL
Hgb urine dipstick: NEGATIVE
Ketones, ur: NEGATIVE mg/dL
Protein, ur: NEGATIVE mg/dL

## 2010-08-03 LAB — CBC
MCH: 30.2 pg (ref 26.0–34.0)
MCV: 91.4 fL (ref 78.0–100.0)
Platelets: 222 10*3/uL (ref 150–400)
RDW: 14 % (ref 11.5–15.5)
WBC: 7.5 10*3/uL (ref 4.0–10.5)

## 2010-08-03 LAB — DIFFERENTIAL
Basophils Relative: 0 % (ref 0–1)
Eosinophils Absolute: 0.1 10*3/uL (ref 0.0–0.7)
Eosinophils Relative: 1 % (ref 0–5)
Lymphs Abs: 1 10*3/uL (ref 0.7–4.0)
Neutrophils Relative %: 77 % (ref 43–77)

## 2010-08-03 LAB — APTT: aPTT: 25 seconds (ref 24–37)

## 2010-08-04 ENCOUNTER — Inpatient Hospital Stay (HOSPITAL_COMMUNITY): Payer: Medicare Other

## 2010-08-04 LAB — FOLATE: Folate: 10.2 ng/mL

## 2010-08-04 LAB — VITAMIN B12: Vitamin B-12: 224 pg/mL (ref 211–911)

## 2010-08-05 LAB — URINE CULTURE: Culture  Setup Time: 201203312004

## 2010-08-05 LAB — GLUCOSE, CAPILLARY: Glucose-Capillary: 92 mg/dL (ref 70–99)

## 2010-08-06 ENCOUNTER — Inpatient Hospital Stay (HOSPITAL_COMMUNITY): Admit: 2010-08-06 | Discharge: 2010-08-06 | Disposition: A | Discharge status: Home / Self Care | Payer: MEDICARE

## 2010-08-06 ENCOUNTER — Inpatient Hospital Stay (HOSPITAL_COMMUNITY): Payer: Medicare Other

## 2010-08-06 DIAGNOSIS — I517 Cardiomegaly: Secondary | ICD-10-CM

## 2010-08-07 LAB — DIFFERENTIAL
Basophils Absolute: 0.1 10*3/uL (ref 0.0–0.1)
Lymphocytes Relative: 37 % (ref 12–46)
Monocytes Absolute: 0.6 10*3/uL (ref 0.1–1.0)
Neutro Abs: 3.1 10*3/uL (ref 1.7–7.7)

## 2010-08-07 LAB — BASIC METABOLIC PANEL
CO2: 28 mEq/L (ref 19–32)
Calcium: 9.3 mg/dL (ref 8.4–10.5)
Glucose, Bld: 92 mg/dL (ref 70–99)
Potassium: 3.7 mEq/L (ref 3.5–5.1)
Sodium: 139 mEq/L (ref 135–145)

## 2010-08-07 LAB — CBC
HCT: 40.7 % (ref 39.0–52.0)
Hemoglobin: 13.2 g/dL (ref 13.0–17.0)
MCHC: 32.4 g/dL (ref 30.0–36.0)

## 2010-08-08 NOTE — Discharge Summary (Signed)
NAME:  Jeremy Carr, Jeremy Carr            ACCOUNT NO.:  [id number]  MEDICAL RECORD NO.:  [id number]           PATIENT TYPE:  I  LOCATION:  A312                          FACILITY:  [facility]  PHYSICIAN:  Elliot Cousin, M.D.    DATE OF BIRTH:  22-Mar-1946  DATE OF ADMISSION:  08/03/2010 DATE OF DISCHARGE:  04/03/2012LH                              DISCHARGE SUMMARY   DISCHARGE DIAGNOSES: 1. Syncope, likely secondary to orthostatic hypotension and     polypharmacy. 2. Mild orthostatic hypotension. 3. Labile hypertension. 4. Parkinson's disease. 5. Bipolar disorder. 6. Hypothyroidism.  The patient's TSH was within normal limits at     1.985. 7. Oxygen-dependent chronic obstructive pulmonary disease, which     remained stable. 8. Probable volume depletion. 9. Bilateral lower lobe lung scarring and moderate pulmonary emphysema     per noncontrasted CT scan of the chest on August 03, 2010.  DISCHARGE DISPOSITION:  The patient was discharged to home in improved and stable condition on August 07, 2010.  He will follow up with Dr. Catalina Pizza next week, the appointment time is pending.  He will follow up with his psychiatrist, Dr. Lolly Mustache as scheduled next week and with his neurologist, Dr. Sandria Manly as scheduled next week.  CONSULTATIONS:  None.  PROCEDURE PERFORMED: 1. A 2-D echocardiogram on August 06, 2010.  The results revealed mild     left ventricular concentric hypertrophy.  Systolic function was     normal.  Ejection fraction was in the range of 60-65%.  Wall motion     was normal.  There were no regional wall motion abnormalities. 2. EEG on August 06, 2010.  The result was pending at the time of     hospital discharge. 3. MRI of the brain without contrast on August 06, 2010.  The results     revealed mild age-appropriate atrophy.  Cerebral white matter     ischemic changes bilaterally, unchanged.  No acute abnormalities. 4. Carotid ultrasound on August 04, 2010.  The results revealed mild     carotid  atherosclerosis, but no hemodynamically significant ICA     stenosis on either side. 5. CT scan of the chest without contrast on August 03, 2010.  The     results revealed bilateral lower lobe scarring with scarring in     superior right lower lobe corresponding to the nodular opacities     seen on the recent chest radiograph.  Moderate pulmonary emphysema.     No active lung disease.  No evidence of mass or lymphadenopathy. 6. CT scan of the head without contrast on August 03, 2010.  The     results revealed no acute findings. 7. Chest x-ray on August 03, 2010.  The results revealed asymmetric     nodular density in the right mid lung.  Pulmonary nodule cannot be     excluded.  Stable bibasilar scarring.  HISTORY OF PRESENTING ILLNESS:  The patient is a 65 year old man with a past medical history significant for oxygen-dependent COPD, bipolar disorder, Parkinson's disease, and hypertension, who presented to the emergency department on August 03, 2010 with a report of two episodes  of passing out.  When he was initially evaluated, he was noted to be afebrile and hemodynamically stable.  He was oxygenating 98% on nasal cannula oxygen.  His EKG revealed normal sinus rhythm with a heart rate of 79 beats per minute and no acute abnormalities.  CT scan of his head revealed no acute intracranial findings.  His chest x-ray revealed an asymmetric nodular density in the right mid lung.  This prompted a CT scan of the chest, which revealed bilateral lower lobe scarring and moderate emphysema.  He was admitted for further evaluation and management.  For additional details, please see the dictated history and physical by Dr. Crista Curb.  HOSPITAL COURSE:  There was a question of a tremor or shaking in the history.  The events were not totally consistent with a tonic-clonic seizure.  Nevertheless, for further evaluation, an EEG was ordered as well as an MRI of the brain, carotid duplex Doppler,  and a 2-D echocardiogram.  The result of the EEG was pending at the time of hospital discharge.  The MRI of his brain revealed no acute stroke, but rather chronic ischemic-type changes.  His 2-D echocardiogram revealed preserved LV function.  His carotid Doppler revealed no significant ICA stenosis.  Orthostatic vital signs were ordered.  The patient was found to be orthostatic by blood pressure.  He was started on IV fluids.  On exam, he appeared to be mildly volume depleted, although his BUN and creatinine were not elevated.  Cardiac enzymes, prolactin level, ammonia level, TSH, vitamin B12, and folate levels were ordered for further evaluation.  His blood ammonia level was within normal limits at 14.  His prolactin level was within normal limits at 15.4.  His TSH was within normal limits at 1.98.  His vitamin B12 was within normal limits at 224.  His folate was within normal limits at 10.2.  His urinalysis was essentially negative as well.  The patient's blood pressure was somewhat labile.  He was restarted on Norvasc.  His systolic blood pressure ranged from the 190s to the 120s. It stabilized at 120/82 prior to discharge.  He was maintained on oxygen therapy for oxygen-dependent COPD.  There was no evidence of exacerbation.  The physical therapist was consulted for evaluation. The patient did appear deconditioned.  She recommended home health physical therapy and occupational therapy, which were ordered prior to hospital discharge.  I reviewed the patient's medications with he and his wife.  They agreed that he required a lot of medications.  I informed them that several of his medications including tramadol, Seroquel, clonazepam, and Lamictal could potentially have drug-drug interactions.  These drug-drug interactions may lead to syncope particularly in the setting of mild volume depletion.  We agreed that tramadol would be one that could be discontinued.  Of note, tramadol does  lower the seizure threshold.  As mentioned above, the result of the EEG was pending at the time of discharge.  The patient was advised to treat his pain with as-needed ibuprofen and as-needed Tylenol.  He was advised to discuss potential medication changes with Dr. Sandria Manly, his neurologist and Dr. Lolly Mustache, his psychiatrist.  If the patient could be maintained on a lower dose of his psychotropic medications, it would probably be ideal to reduce the undesirable side effects.  DISCHARGE MEDICATIONS: 1. Tylenol 325 mg two tablets every 6 hours as needed for pain. 2. Amitiza  24 mcg b.i.d. 3. Amlodipine 10 mg daily. 4. Aspirin 81 mg daily. 5. Baclofen 10 mg  four times daily. 6. Benztropine 1 mg three times daily. 7. Sinemet 25/100 mg one tablet at 7 a.m., 12 noon, 5 p.m., and half a     tablet at 11 o'clock a.m., 3 p.m. and 10 p.m. 8. Clonazepam 1 mg half a tablet b.i.d. 9. Ibuprofen 400 mg three times daily. 10.Lamictal 150 mg daily. 11.Levothyroxine 88 mcg daily. 12.MiraLax laxative 17 g daily. 13.Pravastatin 20 mg nightly. 14.Pyridostigmine 60 mg half a tablet three times daily. 15.Seroquel 200 mg at noontime and 500 mg nightly. 16.Spiriva 18 mcg one puff daily as needed. 17.Zoloft 100 mg two tablets nightly. 18.Nasal cannula oxygen 2-3 liters per minute. 19.Stop tramadol.     Elliot Cousin, M.D.     DF/MEDQ  D:  08/07/2010  T:  08/08/2010  Job:  [id number]  cc:   Catalina Pizza, M.D. Fax: [phone]  Phillips Grout. Lolly Mustache, M.D.  Genene Churn. Love, M.D. Fax: [phone]  Electronically Signed by Elliot Cousin M.D. on 08/08/2010 05:47:04 PM

## 2010-08-14 ENCOUNTER — Encounter (HOSPITAL_COMMUNITY): Payer: Self-pay | Admitting: Psychiatry

## 2010-08-16 ENCOUNTER — Encounter (INDEPENDENT_AMBULATORY_CARE_PROVIDER_SITE_OTHER): Payer: Medicare Other | Admitting: Psychiatry

## 2010-08-16 DIAGNOSIS — F39 Unspecified mood [affective] disorder: Secondary | ICD-10-CM

## 2010-08-22 NOTE — H&P (Signed)
NAME:  Jeremy Carr, Jeremy Carr            ACCOUNT NO.:  [id number]  MEDICAL RECORD NO.:  [id number]           PATIENT TYPE:  I  LOCATION:  A312                          FACILITY:  [facility]  PHYSICIAN:  Khadar Monger L. Lendell Caprice, MDDATE OF BIRTH:  January 11, 1946  DATE OF ADMISSION:  08/03/2010 DATE OF DISCHARGE:  03/30/2012LH                             HISTORY & PHYSICAL   CHIEF COMPLAINT:  "Seizure."  Mr. Philbin is a 65 year old white male who came to the emergency room via EMS with 2 syncopal episodes.  He is amnestic to the events.  He is a difficult historian.  His wife is not present but I have spoken to her via telephone.  She reports that he has a history of these episodes in the past.  It sounds as if Dr. Sandria Manly told her that they were vasovagal syncope and told her to lay the patient down when it occurs.  Today, he was walking and began apparently moaning and shaking.  She laid him down and he became very lethargic.  It sounds as if he never completely lost consciousness.  It lasted for about 15 minutes and he woke up.  It occurred again while he was going to the bathroom.  His symptoms were the same at that time.  He has a history of orthostatic hypotension in looking at old records.  He had been on midodrine in the past but his blood pressure was too high.  The patient reports that he felt the room spinning this morning.  From what I gather, he has vertigo occasionally. He currently is having a difficult time keeping on subjects and he feels that he is forgetting his train of thought frequently.  He feels fatigued and has been feeling this way for several days now.  His appetite has been poor.  He reports that he has been getting clammy and sweaty recently.  He usually walks with a cane or walker.  He has a history of Parkinson disease and bipolar disorder.  He is on multiple sedating medications.  He thinks one of his medications may have changed recently but his wife denies this.  He has  had no nausea, vomiting, diarrhea.  No cough.  There is mention of headache in the ER notes but he denies this currently.  He has no history of stroke or seizures.  He has a previous history of alcohol and drug use but has been sober for decades.  He has no dysuria.  No known fevers.  He is currently hungry. He had no chest pain or palpitations.  PAST MEDICAL HISTORY:  Bipolar disorder, Parkinson disease, COPD, gastroesophageal reflux disease with history of erosive esophagitis. Previous history of alcohol abuse, osteoarthritis, degenerative disk disease, history of biliary dyskinesia, and cholelithiasis.  MEDICATIONS: 1. Seroquel, dosing unknown. 2. Carbidopa and levodopa, dosing unknown. 3. Amlodipine 10 mg a day. 4. Lamictal 150 mg a day. 5. Benztropine 1 mg p.o. t.i.d. 6. Levothyroxine 88 mcg a day. 7. Amitiza 24 mcg twice a day. 8. Baclofen 10 mg p.o. q.i.d. 9. Aspirin 81 mg a day. 10.Zoloft 200 mg a day. 11.Tramadol 50 mg p.o. q.8 hours p.r.n. pain.  12.Pravastatin 20 mg a day. 13.Omeprazole 20 mg a day. 14.Pyridostigmine bromide 30 mg t.i.d. 15.Ibuprofen 400 mg t.i.d. as needed for pain. 16.Clonazepam, dosing unknown.  No known drug allergies.  SOCIAL HISTORY:  The patient is married.  He no longer drinks.  He does not smoke or use drugs.  FAMILY HISTORY:  Significant for hypertension according to ER notes.  SYSTEMS REVIEWED:  As above, otherwise negative.  PHYSICAL EXAMINATION:  VITAL SIGNS:  Temperature is 96.7, blood pressure 124/56, pulse 88, respiratory rate 18, oxygen saturation 91% on room air, ranging to 98%. GENERAL:  The patient is in no acute distress. HEENT:  Normocephalic, atraumatic.  Pupils equal, round, and reactive to light.  Sclerae nonicteric.  He has dry mucous membranes. NECK:  Supple.  No lymphadenopathy.  No carotid bruits.  No thyromegaly. LUNGS:  Clear to auscultation bilaterally without wheezes, rhonchi, or rales. CARDIOVASCULAR:   Regular rate and rhythm without murmurs, gallops, or rubs. ABDOMEN:  Soft, nontender, nondistended. GU AND RECTAL:  Deferred. EXTREMITIES:  No clubbing, cyanosis, or edema. SKIN:  No rash. NEUROLOGIC:  The patient is alert and oriented, but has a difficult time answering several questions.  His speech is rather is quiet and difficult to understand but clear and fluent.  He has cogwheel rigidity. No focal weakness. PSYCHIATRIC:  Normal affect.  LABS:  UA is pending.  CBC normal.  PT/PTT normal.  Complete metabolic panel unremarkable.  Chest x-ray shows COPD, asymmetric nodular density in the right midlung.  CT of the chest without contrast shows bilateral lower lobe scarring.  Moderate pulmonary emphysema.  No mass or lymphadenopathy.  CT of the brain is negative.  EKG shows normal sinus rhythm.  ASSESSMENT AND PLAN: 1. Possible syncopal episode or near syncopal episode:  The patient     appears dehydrated on physical examination and may have had some     orthostatic hypotension, causing his symptoms.  He will get IV     fluids.  He also may have had a cardiac dysrhythmia and I will     monitor him on telemetry.  I will also need to consider seizure, as     he apparently had some type of shaking or jerking motions.  I will     order an EEG, but reading the EEG may be problematic as Dr.     Gerilyn Pilgrim is out of town next week.  We may need to simply have him     followup with Dr. Sandria Manly as an outpatient if he is medically     stabilized.  I will also check orthostatics.  He may need further     workup like echocardiogram or carotid Dopplers, but I will await     the urinalysis result and orthostatics prior to ordering these.     Also, this may be medication related as he is on multiple sedating     medications. 2. History of orthostatic hypotension. 3. Bipolar disorder:  His medications will need to be clarified. 4. Parkinson disease. 5. Stable chronic obstructive pulmonary  disease. 6. Hypothyroidism.  I will check a TSH. 7. Polypharmacy.     Moriya Mitchell L. Lendell Caprice, MD     CLS/MEDQ  D:  08/04/2010  T:  08/04/2010  Job:  [id number]  cc:   Catalina Pizza, M.D. Fax: [phone]  Genene Churn. Love, M.D. Fax: [phone]  Electronically Signed by Crista Curb MD on 08/22/2010 09:36:21 PM

## 2010-09-18 NOTE — Letter (Signed)
Sep 11, 2006    Franchot Heidelberg, M.D.  [address], [address]  [place], [place]  [address]   RE:  Jeremy, LEPERA  MRN:  [id number]  /  DOB:  24-Aug-1945   Dear Remi Haggard:   Jeremy Carr return to the office for continuing assessment and  treatment of chest discomfort with cardiovascular risk factors.  Since  the last visit, he has done quite well.  He reports no recurrent  symptoms.  He is convinced that the original pain was caused by his  orthopedic issues, abnormal posture, and gait disturbance, putting  excess stress on the muscles of his chest wall.   His stress nuclear study was in fact quite good, with pharmacologic  stress.  There were no significant EKG abnormalities.  Left ventricular  function and perfusion were normal.   PHYSICAL EXAMINATION:  GENERAL:  A pleasant gentleman with somewhat  masked facies, in no acute distress.  VITAL SIGNS:  Weight is 216, stable.  Blood pressure 105/65.  Heart rate  90 and regular.  Respirations 16.  NECK:  No jugular venous distention noted.  LUNGS:  Clear.  CARDIAC:  Normal first and second heart sounds; fourth heart sound  present.  EXTREMITIES:  Ankle and pretibial edema 1+, dorsalis pedis pulses, 1-2+  posterior tibialis.   IMPRESSION:  Jeremy Carr is doing generally well from a symptomatic  standpoint.  His stress nuclear study suggests either the absence of  coronary disease or mild, at worst, atherosclerosis.  I see no need for  further cardiology assessment or treatment at this point.  Please let me  know if he has recurrent symptoms or more worrisome evidence for  possible cardiac issues.   He does have dependent edema that is likely related to venous disease.  There are some surface varicosities.  I would be especially reluctant to  use diuretics in Jeremy Carr case because of his history of  orthostatic hypotension and mild recurrent orthostatic changes in blood  pressure.  I think that leg  elevation and salt restriction are adequate  therapy for the present.   Thanks so much for sending this nice gentleman to me.  Please let me  know at any time that I can offer further assistance with his medical  care.    Sincerely,      Gerrit Friends. Dietrich Pates, MD, [facility]  Electronically Signed    RMR/MedQ  DD: 09/11/2006  DT: 09/11/2006  Job #: [id number]

## 2010-09-18 NOTE — Letter (Signed)
Sep 04, 2006    Franchot Heidelberg, M.D.  [address]  [place], [place]  [address]   RE:  JAYDON, AVINA  MRN:  [id number]  /  DOB:  1945/06/13   Dear Remi Haggard,   It was my pleasure evaluating Mr. Lorensen in consultation today in the  office, at your request.  As you know, this nice gentleman has a number  of chronic health problems, most notably Parkinson's disease.  He has  had a history of orthostatic hypotension, but developed substantial  hypertension when treated with midodrine.  He now presents with chest  discomfort.  This is atypical, localized, fairly mild pain that occurs  over the left chest unpredictably and lasts for a number of minutes.  It  is exacerbated by exercise of his upper extremities.  He is unaware of  anything that can cause relief.  There is no associated chest wall  tenderness, nor is there dyspnea, diaphoresis, nor nausea.  He has  experienced these symptoms over recent weeks to months.   CURRENT MEDICATIONS INCLUDE:  1. Clonazepam 1 mg t.i.d.  2. Zoloft 1 tablet b.i.d.  3. Sinemet 25/100 q.i.d.  4. Lamictal 150 mg daily.  5. Tramadol 50 mg t.i.d.  6. Cardura 4 mg daily.  7. Cogentin 1 mg b.i.d.  8. Seroquel 400 mg q.h.s.  9. An albuterol inhaler.   He has no known drug allergies.   PAST MEDICAL HISTORY:  Otherwise notable for an admission to [facility]  for delirium.  There is a history of an episode of right upper quadrant  pain, thought to reflect gallbladder disease.  He has had bipolar  disorder, chronic constipation, BPH.   SOCIAL HISTORY:  Disabled.  Married and lives locally with his wife.  No  children.   FAMILY HISTORY:  Father died with congestive heart failure.  Mother died  at age 56 with Alzheimer's.  One sister has cerebrovascular disease.   REVIEW OF SYSTEMS:  Notable for the need for corrective lenses for  myopia, decreased hearing acuity, upper dentures, occasional  palpitations, severe chronic  constipation, a history of peptic ulcer  disease, urinary frequency with a history of bladder outlet obstruction  and chronic low back pain with DJD of the lumbosacral spine.   ON EXAM:  A chronically ill-appearing gentleman with flat affect and in  no acute distress.  The weight is 216, blood pressure 120/80, heart rate  85 and regular, respirations 16.  NECK:  No jugular venous distention, normal carotid upstrokes without  bruits.  HEENT:  Pupils were equal, round and reactive to light; EOM full;  borderline proptosis.  ENDOCRINE:  No thyromegaly.  HEMATOPOIETIC:  No adenopathy.  LUNGS:  Clear with some increase in the expiratory phase.  CARDIAC:  Normal first and second heart sounds; fourth heart sound and  modest systolic murmur present.  ABDOMEN:  Soft and nontender.  Normal bowel sounds.  No organomegaly.  No masses.  EXTREMITIES:  One-plus ankle and pretibial edema.  Distal pulses intact.  NEUROMUSCULAR:  Slow to respond.  No cranial nerve abnormalities.  No  resting tremor.  Some masked facies and hypokinesia.   Records from [facility] are reviewed.  He had a normal MRA/MRI,  a normal CT scan and reportedly a normal carotid ultrasound, all in  August of 2007.  His echocardiogram showed no important abnormalities.  He had a gallbladder study that showed a low ejection fraction.   IMPRESSION:  Mr. Kahrs has atypical  chest pain with some  cardiovascular risk factors.  It would be prudent to perform a  pharmacologic stress Myoview study.  The procedure was explained to the  patient and his wife, who agreed to proceed.  His cholesterol profile in  the past was moderately abnormal.  It was not in the range that would  mandate therapy.  If one  considers that his TIA was related to cerebrovascular disease,  pharmacologic therapy would be appropriate.  I will leave that decision  to your discretion, since he is already receiving a very complex medical  regimen with  multiple potential interactions.  I will plan to reassess  this nice gentleman after his stress test has been completed.    Sincerely,      Gerrit Friends. Dietrich Pates, MD, [facility]  Electronically Signed    RMR/MedQ  DD: 09/04/2006  DT: 09/04/2006  Job #: [id number]

## 2010-09-18 NOTE — H&P (Signed)
NAME:  Jeremy Carr, Jeremy Carr            ACCOUNT NO.:  [id number]   MEDICAL RECORD NO.:  [id number]          PATIENT TYPE:  AMB   LOCATION:  DAY                           FACILITY:  [facility]   PHYSICIAN:  R. Roetta Sessions, M.D. DATE OF BIRTH:  1945/07/23   DATE OF ADMISSION:  DATE OF DISCHARGE:  LH                              HISTORY & PHYSICAL   REQUESTING PHYSICIAN:  Franchot Heidelberg, MD   CHIEF COMPLAINT:  Dysphagia.   HISTORY OF PRESENT ILLNESS:  Jeremy Carr is a 65 year old Caucasian  male.  For the last 2 months, he has had a sensation of his food  hanging up.  He points to his upper esophagus.  He is having problems  every time he eats.  He denies any problems with liquids, mostly eats  solid foods.  He denies any regurgitation or heartburn.  He does  occasionally have indigestion just about every day, but has not been  taking any medications for this.  He is on Reglan 10 mg q.i.d.  He tells  me if his symptoms gets bad, then he drinks butter milk.  He  occasionally has lower abdominal cramps, but recently started on  baclofen and that has helped significantly.  He denies any rectal  bleeding or melena.  He occasionally has constipation.  He is taking  Amitiza and MiraLax for this.  He attributes much of this to his  Parkinson disease.   PAST MEDICAL AND SURGICAL HISTORY:  He has history of erosive reflux  esophagitis which was moderately severe four-quadrant distal esophageal  erosion.  On last EGD, March 04, 2006, he has history of alcoholism,  manic depression/bipolar disorder, Parkinson disease, COPD,  osteoarthritis, and degenerative disk disease.  He is status post  appendectomy and tonsillectomy and adenoids removed.  He has history of  adenomatous colon polyps removed on the last colonoscopy by Dr. Jena Gauss on  May 15, 2006.  He also has history of biliary dyskinesia and known  cholelithiasis, but has decided against pursuing cholecystectomy.   CURRENT MEDICATIONS:  1. Carbidopa/levodopa 25/100 three and a half daily.  2. Clonazepam 0.5 mg daily.  3. Aspirin 81 mg daily.  4. Zoloft 200 mg nightly.  5. Seroquel 1 g in the a.m., 2 g nightly.  6. Tramadol HCl 50 mg q.8 h. p.r.n.  7. Lamictal 150 mg daily.  8. Proventil p.r.n.  9. Spiriva inhalers daily.  10.Pravachol 20 mg daily.  11.Amitiza 24 mcg b.i.d.  12.Midodrine HCl 2.5 mg daily.  13.Baclofen 10 mg t.i.d.  14.Lidoderm patch q.12 h.  15.Synthroid 25 mcg daily.  16.Promethazine 25 mg p.r.n.  17.Benztropine 1 mg daily.  18.Reglan 10 mg q.i.d.  19.MiraLax 17 g daily.  20.Antevert 25 mg b.i.d.   ALLERGIES:  HALDOL, MIRAPEX, and LYRICA.   FAMILY HISTORY:  There has been no known family history of colorectal  carcinoma or other chronic GI problems.  Mother deceased at 67 secondary  to Alzheimer.  Father deceased at age 67 secondary to CHF.  One sister  with history of bipolar disorder and CVAs.   SOCIAL HISTORY:  Mr. Wieber is married.  He is disabled.  He quit  smoking 10 years ago.  Remote history of alcohol use, quit in 1976.  Denies any drug use.   REVIEW OF SYSTEMS:  See HPI, otherwise negative.   PHYSICAL EXAMINATION:  VITAL SIGNS:  Weight unable to obtain as the  patient is wheelchair bound, height 73 inches, temp 92, blood pressure  110/80, and pulse 96.  GENERAL:  He is an elderly Caucasian male who is alert and oriented,  pleasant, cooperative in no acute distress.  HEENT:  Sclerae clear, nonicteric.  Conjunctivae pink.  Oropharynx pink  and moist without any lesions.  NECK:  Supple without mass or thyromegaly.  CHEST:  Heart regular rate and rhythm.  Normal S1 and S2 without  murmurs, clicks, rubs or gallops.  LUNGS:  With inspiratory and expiratory wheezes bilaterally, but without  acute distress.  ABDOMEN:  Positive bowel sounds x4.  No bruits auscultated.  Soft,  nontender, nondistended without palpable mass or hepatosplenomegaly.  No  rebound, tenderness, or  guarding.  EXTREMITIES:  Without edema.  He does have clubbing.   ASSESSMENT:  Jeremy Carr is a 65 year old Caucasian male with recurrent  dysphagia.  I suspect he may have recurrence of erosive reflux  esophagitis or have developed esophageal web ring or stricture and he is  going to require further evaluation for this.  It is also possible that  some of his dysphagia may be related to his Parkinson disease  compounding a motility disorder.  He has history of chronic  constipation, has responded well to Amitiza and MiraLax combination  therapy.   PLAN:  1. EGD with possible esophageal dilatation with Dr. Jena Gauss in the near      future.  We have discussed the procedure including risks and      benefits, which include but not limited to bleeding, infection,      perforation, drug reaction.      He agrees to the plan and consent will be obtained.  He is to hold      his aspirin 5 days prior to procedure.  2. Begin omeprazole 20 mg daily number 31 with 11 refills and he      should remain on this indefinitely.  3. He will continue Reglan 10 mg q.i.d. and MiraLax and Amitiza for      his chronic constipation.      Lorenza Burton, N.P.      [name], M.D.  Electronically Signed    KJ/MEDQ  D:  02/04/2008  T:  02/05/2008  Job:  [id number]   cc:   Franchot Heidelberg, M.D.

## 2010-09-18 NOTE — Procedures (Signed)
NAME:  Jeremy Carr, Jeremy Carr NO.:  [id number]   MEDICAL RECORD NO.:  [id number]          PATIENT TYPE:  OUT   LOCATION:  SLEEP LAB                     FACILITY:  [facility]   PHYSICIAN:  Barbaraann Share, MD,FCCPDATE OF BIRTH:  June 26, 1945   DATE OF STUDY:                            NOCTURNAL POLYSOMNOGRAM   REFERRING PHYSICIAN:  Franchot Heidelberg, M.D.   INDICATION FOR STUDY:  Hypersomnia with sleep apnea.   EPWORTH SLEEPINESS SCORE:  13.   SLEEP ARCHITECTURE:  The patient had a total sleep time of 330 minutes,  with no slow-wave sleep, and only 8 minutes of REM.  Sleep onset latency  was normal, and REM onset was very prolonged at 209 minutes.  Sleep  efficiency was decreased at 85%.   RESPIRATORY DATA:  The patient was found to have 4 hypopneas and 15  apneas, for an apnea/hypopnea index of 3.5 events per hour.  Upon close  inspection, some of the obstructive apneas that were scored were in  reality central apneas.  The patient was noted to have moderate to loud  snoring, and the events did not appear to be positional.  It should be  noted that the patient did not meet split-night protocol secondary to  the small numbers of events.   OXYGEN DATA:  There was O2 desaturation as low as 81% both, with  obstructive events but also independent of obstructive events.  The  patient was placed on 1 liter of nasal oxygen at 11:38 p.m. because of  his events that were independent of airway obstruction.  This brought  his O2 saturations up nicely, with adequate levels the rest of the  night.   CARDIAC DATA:  No clinically significant cardiac arrhythmias were noted.   MOVEMENT/PARASOMNIA:  No significant leg jerks or abnormal behaviors  were seen.   IMPRESSIONS/RECOMMENDATIONS:  1. Small numbers of obstructive and central events which do not meet      the apnea/hypopnea index criteria for the obstructive sleep apnea      syndrome.  2. O2 desaturation as low as 81%, associated  with obstructive events,      but also independent of them as well.  The      patient was placed on 1 liter of nasal oxygen, with what appears to      be a good response.  I would recommend nocturnal oxygen at this      time, between 1-2 liters.      Barbaraann Share, MD,FCCP  Diplomate, American Board of Sleep  Medicine  Electronically Signed     KMC/MEDQ  D:  11/11/2006 16:37:52  T:  11/12/2006 08:58:32  Job:  [id number]

## 2010-09-21 NOTE — Op Note (Signed)
NAME:  Jeremy Carr, FELTEN            ACCOUNT NO.:  [id number]   MEDICAL RECORD NO.:  [id number]           PATIENT TYPE:  AMB   LOCATION:                                FACILITY:  [facility]   PHYSICIAN:  R. Roetta Sessions, M.D. DATE OF BIRTH:  [birth year]   DATE OF PROCEDURE:  05/15/2006  DATE OF DISCHARGE:                               OPERATIVE REPORT   PROCEDURE:  Colonoscopy with snare polypectomy and ileoscopy.   INDICATIONS FOR PROCEDURE:  The patient is a 65 year old gentleman with  numerous medical problems with recurrent right mid abdominal pain.  Appendix reportedly has been removed.  He has noted cholelithiasis.  He  has significant back pain and possibly radicular component to his right-  sided abdominal pain.  He has never had a complete colonoscopy.  Colonoscopy is now being done.  This approach has been discussed with  the patient at length.  Potential risks, benefits, and alternatives have  been reviewed, questions answered.  She is agreeable.  Please see the  documentation in the medical record.   PROCEDURE NOTE:  O2 saturation, blood pressure, pulse, and respirations  were monitored throughout the entire procedure.   CONSCIOUS SEDATION:  Versed 6 mg IV Demerol 100 grams IV in divided  doses.   INSTRUMENT:  Pentax video chip system   FINDINGS:  Digital rectal exam revealed no abnormalities.  Prep was  marginal, but very doable.   COLON:  Colonic mucosa was surveyed from the rectosigmoid junction  through the left transverse and right colon to the appendiceal orifice,  ileocecal valve, and cecum.  These structures were well seen; and  photographed for the record.  Terminal ileum was intubated to 10 cm.  From this level the scope is slowly and cautiously withdrawn.  All  previous mentioned mucosal surfaces were again seen.  The patient had a  very long tortuous colon which required a combination of external  abdominal pressure, and changing the patient's position to reach  the  cecum.  The terminal ileum mucosa appeared normal.  The colonic mucosa  appeared normal except for a 5-mm polyp in the mid ascending colon, and  a 8-mm pedunculated polyp in the mid sigmoid colon.  The 5-mm polyp in  the ascending colon was cold snared and recovered through the scope; the  8-mm polyp of sigmoid was removed with hot snare cautery.   The scope was pulled down into the rectum.  A thorough examination of  the rectal mucosa, including a retroflex view of the anal verge was  undertaken.  The rectal mucosa appeared normal.  The patient tolerated  the procedure very well; and was reacted in endoscopy.   IMPRESSION:  1. Normal rectum.  2. Long tortuous colon.  3. Polyps in the left and right colon resected as described above.  4. Normal terminal ileum, no endoscopic explanation for the patient's      right-sided abdominal pain.  We will have the patient come back to      see [place] in 2 weeks'; and we will reassess and make further      recommendations as  far as      management is concerned.  5. No aspirin or arthritis medications for 10 days.  6. Followup on path.      [name], M.D.  Electronically Signed     RMR/MEDQ  D:  05/15/2006  T:  05/15/2006  Job:  [id number]   cc:   Franchot Heidelberg, M.D.

## 2010-09-21 NOTE — Consult Note (Signed)
NAME:  Jeremy Carr, Jeremy Carr            ACCOUNT NO.:  [id number]   MEDICAL RECORD NO.:  [id number]          PATIENT TYPE:  INP   LOCATION:  A222                          FACILITY:  [facility]   PHYSICIAN:  Lionel December, M.D.    DATE OF BIRTH:  Feb 14, 1946   DATE OF CONSULTATION:  02/07/2006  DATE OF DISCHARGE:                                   CONSULTATION   REASON FOR CONSULTATION:  Abdominal pain, nausea, vomiting.   REQUESTING PHYSICIAN:  [facility].   HISTORY OF PRESENT ILLNESS:  The patient is a 65 year old Caucasian  gentleman with a history of Parkinson disease and bipolar disorder who  presented to the emergency department with complaints of right upper  quadrant abdominal pain and nausea, vomiting.  He has been seen in the  emergency department on several occasions, twice this week.  Dr. Jena Gauss saw  the patient on February 05, 2006, because of complaints of dysphagia and  underwent EGD during the emergency room evaluation.  EGD revealed moderate  to severe erosive reflux esophagitis and esophagus was dilated with a 56  [place] Maloney dilator.  The patient was not on a PPI.  He was supposed to  start Prevacid but ended up in the hospital because of nausea, vomiting,  abdominal pain.  IV Protonix was started today.  The patient has not had any  vomiting today but has had dry heaves with a clear liquid diet.  He states  his abdominal pain has resolved.  He has had these recurrent episodes for  the past 2 months.  In August 2007, he was in the hospital with TIAs and was  told that he had gallstones and needed to have surgery.  He has seen Dr.  Lovell Sheehan in consultation but his surgery has been postponed.  His abdominal  pain is worse post prandial.  He denies any typical heartburn symptoms.  He  has had dysphagia to solid foods.  He is not sure if this is better status  post dilatation because he has not really eaten any solid foods.  He has  occasional bright red blood per rectum when  he passes hard stool.  He has  constipation which he relates to his numerous medications.  He denies any  melena.   MEDICATIONS AT HOME:  1. Lamictal 25 mg b.i.d.  2. Sinemet 25/100 mg q.i.d.  3. Cogentin 1 mg q.i.d.  4. Cardura 4 mg every day.  5. Tramadol 50 mg t.i.d.  6. Zoloft 100 mg b.i.d.  7. Phenergan 25 mg p.r.n.  8. Spiriva 18 mg inhaler every day.  9. Midodrine 1.25 mg b.i.d.  10.Lactulose 1 tablespoon q.h.s.   ALLERGIES:  1. HALDOL.  2. MIRAPEX.  3. LYRICA.   PAST MEDICAL HISTORY:  1. Parkinson disease, diagnosed at age 51.  2. COPD.  3. Bipolar disorder.  4. He has a long history of depression, has undergone ECGs on multiple      occasions in his teenage years and early 23s.  5. History of prior alcohol abuse, none in 34 years.  6. History of tobacco abuse, quit smoking 4  years ago.  He occasionally      chews tobacco.  7. Degenerative joint disease.  8. Benign prostatic hypertrophy.  9. Moderate to severe erosive reflux esophagitis as outlined above.  10.Tonsillectomy.  11.Appendectomy.  12.TIA.  13.Orthostatic hypotension.  14.History of cholelithiasis.   FAMILY HISTORY:  Negative for colorectal cancer.   SOCIAL HISTORY:  He is married with no children.  He quit smoking 4 years  ago.  No alcohol use in 34 years, heavy user in the past.  He occasionally  chews tobacco, approximately once a month.   REVIEW OF SYSTEMS:  See HPI for GI.  CONSTITUTIONAL:  No significant weight  loss.  CARDIOPULMONARY:  No chest pain.  GENITOURINARY:  Urinary hesitancy.   PHYSICAL EXAMINATION:  VITAL SIGNS:  Temp 97.6, pulse 95, respirations 18,  blood pressure 119/81, weight 199 pounds, height 62 inches.  GENERAL:  A pleasant, elderly Caucasian male in no acute distress.  NEUROLOGIC:  He is noted to have tremors with intentional movements.  He has  great difficulty with memory and placing events in chronological order.  He  has great difficulty giving history and staying  focused.  SKIN:  Warm and dry.  No jaundice.  HEENT:  Sclerae are nonicteric.  Oropharyngeal mucosa is moist and pink.  No  lesions, erythema or exudate.  CHEST:  Lungs are clear to auscultation.  CARDIAC:  Reveals a regular rate and rhythm.  No murmurs, rubs, or gallops.  ABDOMEN:  Positive bowel sounds.  Soft, nontender, nondistended.  No  organomegaly or masses.  No abdominal hernias or bruits.  EXTREMITIES:  No edema.   LABORATORY:  White count 6900, hemoglobin 13.7, hematocrit 39.6, platelets  248,000.  INR 1.1.  BUN 10, creatinine 1, glucose 89, sodium 136, potassium  3.7.  Urinalysis negative.  Total bilirubin 0.8, alkaline phosphatase 60,  AST 21, ALT less than 8, albumin 4, lipase 14.  CT revealed mild wall  thickening in the sigmoid colon versus under extension.  CT of the head  revealed no acute disease.   IMPRESSION:  1. The patient is a 65 year old gentleman with recurrent nausea, vomiting,      and right upper quadrant abdominal pain worse post prandial.  He is      known to have gallstones.  Suspect his symptoms are secondary to      biliary disease.  2. He also has moderate to severe reflux esophagitis on      esophagogastroduodenoscopy two days ago.  He denies any typical reflux      symptoms.  Reflux esophagitis may be due to ongoing nausea, vomiting      plus or minus a motility disorder, plus Parkinson disease.  3. With regards to sigmoid colon findings on CT, I suspect this is most      likely artifactual, however, the patient has been recommended to have      outpatient colonoscopy at some point in the near future.   RECOMMENDATIONS:  1. Continue IV Protonix.  2. We will discuss further with Dr. Karilyn Cota with regards to possible      surgical consultation.      Tana Coast, P.A.      Lionel December, M.D.  Electronically Signed   LL/MEDQ  D:  02/07/2006  T:  02/08/2006  Job:  [id number]   cc:   [facility]

## 2010-09-21 NOTE — H&P (Signed)
NAME:  Jeremy Carr, Jeremy Carr            ACCOUNT NO.:  [id number]   MEDICAL RECORD NO.:  [id number]          PATIENT TYPE:  INP   LOCATION:  A222                          FACILITY:  [facility]   PHYSICIAN:  Margaretmary Dys, M.D.DATE OF BIRTH:  02/14/1946   DATE OF ADMISSION:  02/06/2006  DATE OF DISCHARGE:  LH                                HISTORY & PHYSICAL   ADMITTING DIAGNOSES:  1. Nausea and vomiting.  2. Acute abdominal pain.  3. History of Parkinson's disease.  4. History of chronic constipation.  5. History of abnormal abdominal x-ray with probable cecal thickening.   HISTORY OF PRESENT ILLNESS:  Jeremy Carr is a 65 year old Caucasian male  who has Parkinson's disease.  The patient presented to the emergency room  with complaints of right lower quadrant abdominal pain.  He also had some  nausea and some vomiting.   The patient is not a particularly good historian because of Jeremy advanced  Parkinson's disease.   He presented to the emergency room with complaints of some nausea and  vomiting and right lower quadrant abdominal pain of about 3 days' duration.  The patient likely had been in the emergency room earlier today and was  discharged.  The patient says the pain is like crampy in nature.  The  patient thinks one of Jeremy medications for Jeremy Parkinson's disease, which is  Sinemet, is causing him to be constipated; he takes a medication and  yesterday he did have a very good bowel movement, but it was fairly hard.   He denies any fevers or chills, no headaches or dizziness, no shortness of  breath, no chest pain.  An abdominal series in the emergency room really was  unremarkable with no evidence of small-bowel obstruction, but also mucosal  edema with thickening and it was recommended that patient be admitted for  further evaluation.  The patient remains otherwise stable.   REVIEW OF SYSTEMS:  Ten-point review of systems is otherwise negative,  except as mentioned in the  history of present illness.   PAST MEDICAL HISTORY:  1. Advanced Parkinson's disease.  2. COPD.  3. Bipolar disorder with depression.  4. History of past alcohol abuse.  5. History of tobacco abuse.  6. Degenerative joint disease, on Tramadol.  7. Prostatic hypertrophy.  8. Erectile dysfunction.  9. Tonsillectomy.  10.Appendectomy.   MEDICATIONS:  1. Sinemet 25/100 one p.o. q.i.d.  2. Cogentin 1 mg p.o. q.i.d.  3. Cardura 4 mg p.o. once a day.  4. Midodrine 2.5 mg p.o. b.i.d.  5. Zoloft 100 mg p.o. once a day.  6. Lamictal 100 mg p.o. once a day.  7. Tramadol & acetaminophen  one p.o. q.4 h. p.r.n.  8. Clonazepam 1 mg p.o. t.i.d.  9. Lactulose 15 mL as needed for constipation.  10.Phenergan 25 mg p.o. as needed for nausea and vomiting.  11.Aspirin 81 mg p.o. once a day.  12.Albuterol inhaler two puffs q.i.d. p.r.n.  13.Spiriva 18 mcg inhaled once a day.  14.Amitriptyline 25 mg p.o. nightly.   ALLERGIES:  He has no known drug allergies.   FAMILY HISTORY:  History  of congestive heart failure in Jeremy father, dementia  in Jeremy mother, hypertension, rheumatoid arthritis and stroke in Jeremy sister.   SOCIAL HISTORY:  The patient is married and lives with Jeremy Carr; she was not  present during this interview.  He quit smoking in 2005 after a 45-pack-year  history of smoking.  He uses smokeless tobacco and has not had alcohol since  1976.  The patient does not have any children.   PHYSICAL EXAMINATION:  GENERAL:  The patient was conscious, alert, in no  acute distress.  Jeremy speech was fairly slow.  VITAL SIGNS ON ARRIVAL:  Jeremy blood pressure was 134/84, pulse of 99,  respirations of 16, temperature 98.6.  Oxygen saturation was 93% on room  air.  HEENT:  Normocephalic, atraumatic.  Oral mucosa was moist with no exudates.  NECK:  Supple.  No JVD.  No lymphadenopathy.  LUNGS:  Clear clinically with good air entry bilaterally.  HEART:  S1 and S2 regular.  No S3, S4, gallops or rubs.   ABDOMEN:  Soft and nontender.  Bowel sounds were positive.  No masses  palpable.  There was no guarding or rigidity or rebound.  EXTREMITIES:  No pitting pedal edema.  No calf induration or tenderness were  noted.  CNS:  The patient does have advanced Parkinson's disease with a flat affect.  He had no neurological deficits though.  Pupils were reactive.   LABORATORY/DIAGNOSTIC DATA:  Abdominal series done revealed there was no  evidence of bowel obstruction, but some mucosal edema in the cecal region.   White blood cell count was 5.6, hemoglobin 14.5, hematocrit 42 and platelet  count was 265,000 with no left shift.  PT 14.5; INR is 1.1.  Sodium is 138,  potassium 4.0, chloride of 105, CO2 26, glucose 119, BUN of 12, creatinine  is 1.1.  LFTs were normal.  Lipase was 14.  Urinalysis was negative.   ASSESSMENT AND PLAN:  Jeremy Carr is a 65 year old Caucasian male with  advanced Parkinson's disease and other multiple medical problems, who  presents with right lower quadrant abdominal pain with nausea and vomiting.   He is fairly stabilized.  Jeremy abdominal exam is really unremarkable.  Other  than the abnormal x-ray, Jeremy laboratories are normal.  I will admit him and  give him some intravenous fluids.  I will order a CT scan of Jeremy abdomen and  pelvis to further evaluate Jeremy cecum.   I will resume all Jeremy other prior home medications and control Jeremy pain with  morphine as needed and Jeremy nausea and vomiting with Phenergan or Zofran.   The patient does give a history of obstipation which can be fairly severe;  hence he has been on lactulose for this.  We will give him a Dulcolax  suppository to see if that will help.   I have discussed the above plan with the patient and he verbalized full  understanding.      Margaretmary Dys, M.D.  Electronically Signed     AM/MEDQ  D:  02/07/2006  T:  02/07/2006  Job:  [id number]
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
## 2010-09-21 NOTE — Assessment & Plan Note (Signed)
[facility]                                   ON-CALL NOTE   NAME:TRAINHAMTukker, Byrns                   MRN:          [id number]  DATE:12/14/2005                            DOB:          June 30, 1945    PHONE CALL:  Phone number [phone].  Patient of  Dr. Amador Cunas.  Phone  call comes at 7 p.m. on August 11.  Mr. Kail was in [facility] last week  and apparently had a gallbladder attack but a decision was made not to do  surgery because his blood pressure was up.  His neurologist had been  treating his blood pressure more.  He calls today because he has had some  numbness and thinks his gallbladder is acting up.  The phone call was kind  of all over the place.  I spoke to him and then his wife.  He really is not  having abdominal pain but he is constipated and has GlycoLax and lactulose  for that but has not moved his bowels today.  He is not having nausea or  vomiting, no fever - his temperature is 96.  Blood pressure is better  running around 140/90 in both arms and he calls asking what he should do.  He has an appointment with [facility] this week coming up for  scheduling of an elective cholecystectomy but he thinks he is acting up now  because of the numbness.  I talked to his wife for awhile as well and then  she finally said that he thinks he is having a stroke, which he did not tell  me.  I thought he was describing numbness just in his abdomen and she said  it was up along full body and certainly this would be unusual for a stroke,  since sensory strokes are rare but I told her to call 911 if she thought he  was having a stroke and have him evaluated in the emergency room.  Before  the stroke issue I had told him to try some extra GlycoLax and lactulose to  see if they could get him to move his bowels, perhaps avoiding an emergency  room visit.                                   Karie Schwalbe, MD   RIL/MedQ  DD:  12/14/2005  DT:  12/15/2005  Job #:  [id number]   cc:   Gordy Savers, MD

## 2010-09-21 NOTE — Op Note (Signed)
NAME:  Jeremy Carr, Jeremy Carr            ACCOUNT NO.:  [id number]   MEDICAL RECORD NO.:  [id number]          PATIENT TYPE:  INP   LOCATION:  A222                          FACILITY:  [facility]   PHYSICIAN:  R. Roetta Sessions, M.D. DATE OF BIRTH:  [birth year]   DATE OF PROCEDURE:  02/05/2006  DATE OF DISCHARGE:                                 OPERATIVE REPORT   PROCEDURE:  Esophagogastroduodenoscopy with Jeremy Carr dilation.   ENDOSCOPIST:  [name], M.D.   INDICATIONS FOR PROCEDURE:  The patient is a 65 year old gentleman with  multiple medical problems, with longstanding gastroesophageal reflux  disease, who presents to the emergency department to see Dr. Rhae Lerner.  [place] with a three-year-history of progressive esophageal dysphagia.  He  describes transit pill and food impactions over the years.  He is not on any  acid suppression therapy.  He has had some abdominal pain recently as well.  He has been worked up by Dr. Pryor Montes.  It is reported to me that he has  cholelithiasis and he is slated to see Dr. Dalia Heading electively for  consideration of a cholecystectomy.  Jeremy Carr denies ever having a  separate GI tract evaluation.  He tells me that he has never had a  colonoscopy as well, for that matter, although an attempt was made down in  [place], but the prep was very poor and it was never done.  An EGD is now  being done urgently.  This approach has been discussed with the patient and  a potential for esophageal dilation has been reviewed.  Questions were  answered and he is agreeable.  The risks, benefits and alternatives have  been reviewed.   DESCRIPTION OF PROCEDURE:  The O2 saturation, blood pressure, pulse and  respirations were monitored throughout the entire procedure.  Conscious  sedation with Versed 4 mg IV and Demerol 100 mg IV in divided doses, with  Cetacaine spray for topical pharyngeal anesthesia.  Instrument:  The Olympus  videoscope system.   FINDINGS:   Examination of the tubular esophagus revealed 3 to 4 cm four-  quadrant distal esophageal erosions.  There is no Barrett's esophagus.  There was no stricture, ring or neoplasm.  The EG junction was easily  traversed.  The stomach, colon, gastric cavity were empty and insufflated  well with air.  Examination of the gastric mucosa including retroflexed view  of the proximal stomach and esophagogastric junction demonstrated no  abnormalities.  The pylorus was patent and easily traversed.  Examination of  the bulb and second portion revealed no abnormalities.   THERAPEUTIC/DIAGNOSTIC MANEUVERS PERFORMED:  A 56-French Maloney dilator was  passed to full insertion with ease.  This revealed no apparent complications  with pass of the dilator.  The patient tolerated the procedure well and was  reactive in endoscopy.   IMPRESSION:  1. Four-quadrant distal esophageal erosions, consistent with moderately-      severe erosive reflux esophagitis, otherwise normal tubular esophagus,      status post passage of a 56-French Maloney dilator.  2. Normal stomach, normal D-I and D-II.   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  He has a history Parkinson's disease, which may be  a compounding reflux dysmotility component.   RECOMMENDATIONS:  1. Acid suppression therapy with Prevacid Solu tab 30 mg once daily, a      prescription given.  He is to go by my office for free samples.  2. If he has recurring esophageal dysphagia, would consider esophageal      manometry coupled with a barium cholo-esophagogram.  3. Will arrange for him to come back to see [place] in three months.  4. I have urged him to consider having a screening colonoscopy at some      point in the near future.      [name], M.D.  Electronically Signed     RMR/MEDQ  D:  02/05/2006  T:  02/06/2006  Job:  [id number]   cc:   Dalia Heading, M.D.  Fax: [phone]    Theophilus Bones, M.D.   [name], M.D.  [address]  [place]  [place] [address]

## 2010-09-21 NOTE — Discharge Summary (Signed)
NAME:  Jeremy Carr, Jeremy Carr            ACCOUNT NO.:  [id number]   MEDICAL RECORD NO.:  [id number]          PATIENT TYPE:  INP   LOCATION:  3705                         FACILITY:  [facility]   PHYSICIAN:  Valerie A. Felicity Coyer, MDDATE OF BIRTH:  12/29/1945   DATE OF ADMISSION:  12/14/2005  DATE OF DISCHARGE:  12/18/2005                                 DISCHARGE SUMMARY   DISCHARGE DIAGNOSES:  1. Transient ischemic attack with right-sided numbness consistent with      left brain tissue, negative neurologic evaluation.  2. Chronic obstructive pulmonary disease, advanced but did not qualify for      home oxygen, no acute exacerbation.  Continue monitoring for home      oxygen needs.  3. Orthostatic hypotension, medication induced, longstanding.  Continue      b.i.d. Midrin as well as TED hose as tolerated.  4. Parkinson's disease, currently compensated.  Medication management as      per Dr. Sandria Manly and as prior to admission.  5. Bipolar disease, currently compensated.  Continue home medication.  6. Cholelithiasis with symptomatic nausea, occasional vomiting, unable to      proceed with elective cholecystectomy at this time due to neurologic      event.  Continue Phenergan as needed with outpatient surgical followup      in the next 6 to 8 weeks.  7. Benign prostatic hypertrophy with difficulty urinating, history of      partial bladder outlet obstruction.  Will change Cardura to Flomax.      Currently Foley out with independent voiding prior to discharge if      greater than 24 hours.   DISCHARGE MEDICATIONS:  1. Aspirin 81 mg once daily.  2. Flomax 0.4 mg once daily instead of Cardura which we discontinued.  3. Phenergan 25 mg one p.o. or p.r. q.4 h p.r.n.   Other medications are as previously listed:  1. Sinemet 25/100 four times daily.  2. Midrin 2.5 b.i.d.  3. Lamictal 100 daily.  4. Zoloft 100 daily.  5. Lactulose p.r.n.  6. Cogentin 1 mg q.i.d.  7. Klonopin 1 mg t.i.d.  8. Ultram  p.r.n.   DISPOSITION:  Discharged home where he lives with his wife in medically  stable and improved condition.  No home health needs.  Outpatient followup  will be as needed with Dr. Amador Cunas, Dr. Sandria Manly, and with [facility]  Surgery to be rescheduled as again due to acute TIA the patient missed his  outpatient evaluation for elective cholecystectomy.   HOSPITAL COURSE:  1. Transient ischemic attack.  The patient was brought to the emergency      room on August 12 after experiencing onset of right-sided numbness      ranging from his ear done to his toe.  This was associated with      headache and nausea and had mostly resolved by the time of ER      evaluation.  It was felt to be consistent with a TIA and thus admitted      for further evaluation.  Neurologist, Dr. Thad Ranger, saw the patient in  consultation and agreed with workup for TIA to include MRI/MRA, 2-D      Doppler, 2-D echo and stroke labs.  These were performed and      fortunately had no acute events that could be identified.  His      cholesterol was mildly elevated at 205 with an HDL of 39 and LDL of 148      but otherwise normal labs.  His symptoms had resolved and PT/OT felt      there were no acute needs.  Bilateral carotid Dopplers were negative      for significant carotid stenosis and 2-D echo was also normal.  Speech      therapy had no suggestions or evaluations, and it was thus felt the      patient was stable for discharge home on continued aspirin 81 mg      therapy daily for help in preventing recurrent ischemic events and      other risk management as prior to admission.  2. Cholelithiasis.  The patient has known history of gallstones and was      for outpatient general surgery evaluation to consider elective      cholecystectomy scheduled for August 14, which of course was not done      due to inpatient hospitalization with acute TIA.  The patient has had      continued nausea but normal LFTs,  normal white count, no fever and no      toxic signs or symptoms of cholecystitis.  He has no pain or guarding      and is generally tolerating p.o. but complaining of nausea.  At this      time it is felt that since elective surgery cannot be performed in the      acute setting of TIA, will simply continue Phenergan p.o. or p.r. on an      as-needed basis with outpatient followup to reconsider elective      cholecystectomy by general surgery to be arranged.  3. Orthostatic hypotension.  The patient has long history of same      exacerbated by medications, and Midrin has been managed and adjusted by      Dr. Sandria Manly.  On initial hospitalization it was only written for once      daily, and patient has been taking his twice daily at home.  Upon      resumption of b.i.d. dosing, the patient's blood pressure improved and      was in fact running a little high.  The patient is asymptomatic.  Also      encouraged continued wearing of TED hose for assistance in this manner.  4. BPH.  The patient has not had a UA during this hospitalization, but the      patient's wife notes that Cardura seems to not be helping the patient      as he has difficulty voiding, with full bladder emptying at home and      requested change.  Will change to Flomax though explained that this may      also further exacerbate his orthostatic hypotension and that this will      need to be carefully monitored during the initiation of this      medication.  She agrees to this and is glad for change.  Outpatient      followup with urology if and when needed.      Valerie A. Felicity Coyer, MD  Electronically Signed  VAL/MEDQ  D:  12/18/2005  T:  12/18/2005  Job:  [id number]

## 2010-09-21 NOTE — H&P (Signed)
NAME:  Jeremy Carr, Jeremy Carr            ACCOUNT NO.:  [id number]   MEDICAL RECORD NO.:  [id number]          PATIENT TYPE:  AMB   LOCATION:  DAY                           FACILITY:  [facility]   PHYSICIAN:  R. Roetta Sessions, M.D. DATE OF BIRTH:  1945/08/25   DATE OF ADMISSION:  DATE OF DISCHARGE:  LH                              HISTORY & PHYSICAL   ADDENDUM   CONTINUATION OF PLAN:  He was also advised to continue his stool  softeners as before. He will also take MiraLax 17 g everyday.  Prescription for Anusol HC suppositories one per rectum b.i.d. for 14  days provided.      Tana Coast, P.AJonathon Bellows, M.D.  Electronically Signed    LL/MEDQ  D:  05/01/2006  T:  05/01/2006  Job:  [id number]   cc:   R. Roetta Sessions, M.D.  [address]    [place] [address]

## 2010-09-21 NOTE — H&P (Signed)
NAME:  Jeremy Carr, BOGDEN            ACCOUNT NO.:  [id number]   MEDICAL RECORD NO.:  [id number]          PATIENT TYPE:  INP   LOCATION:  1847                         FACILITY:  [facility]   PHYSICIAN:  Rosalyn Gess. Norins, M.D. [facility] OF BIRTH:  January 12, 1946   DATE OF ADMISSION:  11/18/2005  DATE OF DISCHARGE:                                HISTORY & PHYSICAL   CHIEF COMPLAINT:  Difficulty breathing.   HISTORY OF PRESENT ILLNESS:  Jeremy Carr is a 65 year old married white  male followed for COPD, history of substance abuse, question of movement  disorder, i.e., Parkinson's disease.  The patient has recently had multiple  ER visits for chest pain, abdominal pain with nausea and vomiting and has  been diagnosed with gallstones.   The patient recently had his medications adjusted by Dr. Sandria Manly where he had  his Mirapex, Lyrica, and Stalevo  stopped and was started on Lamictal.  Evidently he had problems with hallucinations and mental disturbance with  the medications that were stopped.   The patient, unfortunately, developed increased shortness of breath the  morning of admission.  Because of his shortness of breath, he presented to  [facility] Emergency Department.  He was initially evaluated by the ER  physician to rule out PE with a chest x-ray that was unremarkable with no  signs of volume loss, and a CT scan of the chest which showed no signs of  pulmonary embolus.  CT did show that there was question of bibasilar  posterior infiltrates versus atelectasis.   The patient has been too weak at home for his wife to be able to move,  requiring ambulance transport.  She is unable to manage his activities of  daily living at home.  She reports that for 3 weeks he has had increasing  somnolence, weakness, and lack of motivation.  The patient is now admitted  for failure to thrive, weakness, somnolence, question of  medication  changes.   PAST MEDICAL HISTORY:  No surgeries are reported.   MEDICAL:  1.  Chronic obstructive pulmonary disease.  2.  History of tobacco abuse.  3.  History of degenerative joint disease.  4.  Benign prostatic hypertrophy.  5.  Erectile dysfunction.   ALLERGIES:  The patient has no known drug allergies.   CURRENT MEDICATIONS:  1.  Lamictal 25 mg daily.  2.  Carbidopa-levodopa 20/100 daily.  3.  Doxazosin 8 mg daily.  4.  Tramadol 50 mg 4 times a day.  5.  Zoloft 50 mg daily.  6.  Clonazepam 1 mg 3 times a day.  7.  Benztropine 1 mg q. 6 h.  8.  Phenergan 25 mg q. 6 h.   HABITS:  The patient quit smoking 2 years ago, replaced with use of oral  tobacco.  The patient stopped drinking in 1976.   FAMILY HISTORY:  Father died of congestive heart failure.  Mother had senile  dementia.  One sister with hypertension and rheumatoid arthritis and has had  a recent stroke.   SOCIAL HISTORY:  They do seem to have a supportive network.   REVIEW OF SYSTEMS:  The patient denies any chest pain.  He has ongoing  abdominal discomfort and nausea.  No pulmonary problems except for the HPI.   PHYSICAL EXAMINATION ON ADMISSION:  VITAL SIGNS: Temperature 97.4,  blood  pressure 129/88 to a low of 99/63, heart rate 79, respirations 22.  GENERAL APPEARANCE:  A white gentleman, older than his stated age.  No acute  distress but very somnolent.  HEENT:  Normocephalic and atraumatic.  TMs were normal.  The patient is  edentulous.  He has dentures on the uppers. Posterior pharynx is dry. Mucous  membranes were dry.  NECK: Supple without thyromegaly.  No lymphadenopathy was appreciated.  CHEST:  Tenderness.  LUNGS:  The patient has good breath sounds.  No rales, wheezes or rhonchi  are noted.  He has no increased work of breathing with no accessory muscle  use.  CARDIOVASCULAR: Patient's precordium was quiet.  He had no JVD, no carotid  bruits.  He had a regular rate and rhythm without murmurs, rubs or gallops.  ABDOMEN:  Patient had positive bowel sounds x4.   No organomegaly, no  tenderness to deep palpation in the right upper quadrant.  He had mild belly  tenderness in the left lower quadrant.  GENITALIA: The patient with Foley catheter in place.  RECTAL: Exam deferred.  NEUROLOGIC:  The patient is very somnolent. His speech is somewhat slurred  and difficult to understand secondary to lose dentures and dry mouth.  He is  a reasonable historian with a complex history but poor insight into his  medical condition.  Cranial nerves II-XII revealed normal facial symmetry.  It was s noted he had good grip. Lower extremities: The patient can raise  his legs against gravity.  He offered good resistance to both proximal and  distal strength.  DTRs 2+ and symmetrical throughout. He had downgoing toes.  He had no resting tremor.  He had minimal cogwheeling.  SKIN: Was clear.   LABORATORY:  Hemoglobin 14.8 grams, white count 5300 with 57% segs, 32%  lymphs, 8% monos, 2% eosinophils.  Chemistries were unremarkable with a  potassium of 4, creatinine 1.2, CO2 26.5, glucose was 94.   CT scan of chest with no PE, posterior bilateral atelectasis versus  infiltrate.   Ultrasound the abdomen done 1 week earlier revealed normal gallbladder with  no wall thickening.  Common bile duct was normal.  The patient does have  multiple small gallstones.   ASSESSMENT AND PLAN:  1.  Pulmonary: I was called by the ER physician for potential pneumonia.  On      examination, the patient has no fever, no white count, no cough, no      increased work of breathing, no rales, no wheezes; therefore, no      clinical evidence of pneumonia.  He has known chronic obstructive      pulmonary disease.  CT of chest with question of small bibasilar      posterior infiltrates versus atelectasis.  Plan: We will follow up chest      x-ray. At this time, we will hold on any antibiotics after initial dose      given in the ER 2.  Neurologic:  Patient with progressive weakness at home,  decreased      ambulation, somnolence.  The patient's wife cannot manage him at home      with ADLs.  He was just diagnosed with Parkinson's disease and is      currently on Sinemet.  He recently  was on Lyrica, Mirapex, and Stalevo ,      but these were stopped, and he was just now started on Lamictal.  He is      in the first week.  His examination is nonfocal with objectively good      strength despite weakness in terms of being able to ambulate.  I did not      try to stand the patient because of his weakness.  There is no evidence      of an acute neurologic event.  I question if this is medication effect      only.  Plan: We will continue his present medications including      carbidopa-levadopa and  Lamictal.  I have talked to neurology and asked      for consult in regards to medication management.  3.  Gastrointestinal: Patient with recent abdominal pain, nausea and      vomiting.  He continues to have some nausea at home.  He does have      gallstones but no evidence of cholecystitis.  He does have a history of      chronic constipation.  Plan: Proton pump inhibitor, lactulose.  We will      repeat LFTs.  4.  Genitourinary:  The patient has history of benign prostatic hypertrophy      on Cardura.  He is having urinary frequency with partial bladder outlet      obstruction with a weak stream.  He now has a Foley catheter in place.      The patient may need to have his regimen changed, either increasing the      Cardura, although he is already at 8 mg daily.  Could consider the use      of Flomax as a more potent agent.  5.  Psych:  Patient with history of bipolar disease.  Question of whether      some of his weakness represents depressive phase.  He is currently on      low-dose Zoloft.  Lamictal was recently started.  Plan: Will increase      the Zoloft to 100 mg daily.  We will continue Lamictal. If the patient      does not improve, we will get a psychiatric consult as well if  the      patient needs any stimulants.  6.  Disposition: If the patient continues to be weak and cannot manage his      activities of daily living, he may need to have a short-term skilled      rehab facility placement.           ______________________________  Rosalyn Gess. Norins, M.D. [facility]     MEN/MEDQ  D:  11/18/2005  T:  11/18/2005  Job:  [id number]   cc:   Gordy Savers, M.D. [facility]  [address] [place]  [place] [address]   Genene Churn. Love, M.D.  Fax: [phone]

## 2010-09-21 NOTE — H&P (Signed)
NAME:  Jeremy Carr, Jeremy Carr            ACCOUNT NO.:  [id number]   MEDICAL RECORD NO.:  [id number]          PATIENT TYPE:  AMB   LOCATION:  DAY                           FACILITY:  [facility]   PHYSICIAN:  R. Roetta Sessions, M.D. DATE OF BIRTH:  [birth year]   DATE OF ADMISSION:  DATE OF DISCHARGE:  LH                              HISTORY & PHYSICAL   CHIEF COMPLAINT:  Followup of right lower abdominal pain.   HISTORY OF PRESENT ILLNESS:  Patient is a 65 year old Caucasian  gentleman with a history of Parkinson's disease and bipolar disorder and  recent hospitalization for right-sided abdominal pain with nausea and  vomiting.  He presents today in followup of a recent hospitalization. He  complains that his abdominal pain was never right upper quadrant but  right lower quadrant. He says he has had it now since August of 2007. It  seems to be getting worse. He has nausea, especially postprandially, but  no longer has any vomiting. He complains of chronic constipation and is  currently on stool softeners two twice a day and uses Lactulose and  MiraLax p.r.n. alternating them. Over the holiday, he took Dulcolax four  days in a row and had blowout and felt nausea and vomiting with it as  well. He says he often has to use a Fleet enema or digital stimulation  in order to have a bowel movement. His stools have been soft, but he  feels like he can never empty his rectum. He denies any melena or rectal  bleeding. He took Prevacid for about three to four weeks. Back in  February 05, 2006, he had an EGD which revealed four quadrant distal  septal eversions. Esophagus was dilated given a history of dysphagia. He  was given a year's prescription, but again only took the samples  provided and felt like his symptoms had resolved. He complains of rectal  irritation and itching.   Admitted to the hospital on February 07, 2006, for abdominal pain, nausea  and vomiting. He had abdominal pelvic CT which questioned  mild wall  thickening in the sigmoid colon, but it was felt that this was likely  due to incomplete distension. He had bilateral lung scar verses an  atelectasis. He is known to have gallstones seen on prior abdominal  ultrasound. He has a history of a couple of attempted colonoscopies, but  describes to me that he was never completely cleaned and these cannot be  completely performed. Last attempt was in 2006 by Dr. Russella Dar.   CURRENT MEDICATIONS:  1. Sinemet 25/100 five daily.  2. Cogentin 1 mg half tablet q.i.d.  3. Cardura 2 mg daily.  4. Zoloft 50 mg t.i.d.  5. Clonazepam 1 mg t.i.d.  6. Tramadol 50 mg t.i.d.  7. Midodrine 2.5 mg half tablet daily.  8. Phenergan 25 mg p.r.n.  9. Stool softener 2 b.i.d.  10.Aspirin 81 mg daily.  11.Lactulose 2 tablespoons p.r.n.  12.Inhaler 2 puffs b.i.d.  13.MiraLax daily to b.i.d. p.r.n.   ALLERGIES:  HALDOL, MIRAPEX AND LYRICA.   PAST MEDICAL HISTORY:  Parkinson's disease, diagnosed age 47, COPD,  bipolar disorder, long history of depression, has undergone ECGs on  multiple occasions in his teenage years and early 36s. History of prior  alcohol abuse, none in 34 years. History of tobacco abuse, quit four  years ago, he occasionally chews tobacco. Degenerative joint disease,  BPH, moderate-to-severe erosive reflux esophagitis, October 2007 EGD.  Tonsillectomy, appendectomy, TIA, orthostatic hypotension, history of  cholelithiasis.   FAMILY HISTORY:  Negative for colorectal cancer.   SOCIAL HISTORY:  Married with no children. Quit smoking four years ago.  No alcohol use in 34 years. Heavy user in the past. Occasionally chews  tobacco approximately once a month.   REVIEW OF SYSTEMS:  See HPI.  CONSTITUTIONAL: No significant weight loss. He complains of feeling very  weak.  CARDIOPULMONARY: No chest pain.  GENITOURINARY: He complains of urinary incontinence at times.   PHYSICAL EXAMINATION:  Weight: 196 per patient. Height 6 foot  1-1/2  inches. Temperature 98. Blood pressure 102/60, pulse 76.  GENERAL: Pleasant, elderly Caucasian male in no acute distress. He has  intentional tremors, difficulty with memory and giving history and  staying focused. He is accompanied by his wife. Skin: Warm and dry, no  jaundice.  HEENT: Sclerae anicteric. Oropharyngeal mucosa moist and pink.  CHEST: Lungs clear to auscultation.  CARDIAC EXAMINATION:  Reveals regular rate and rhythm, no murmurs, rubs  or gallops.  ABDOMEN: Positive bowel sounds, soft, nontender, nondistended. No  organomegaly or masses. No abdominal bruits or hernias. Rectal  examination reveals no masses externally. No masses felt internally.  Secretions are heme negative. No pain elicited on rectal exam.  EXTREMITIES: No edema.   IMPRESSION:  1. Patient is a 65 year old gentleman with a four-month history of      recurrent right mid-abdominal pain. He is known to have gallstones.      He is also known to have significant constipation, which may be      causing his abdominal pain. He really is not describing typical      biliary symptoms at this time. He has never had a complete      colonoscopy. And given ongoing issues with constipation, would      recommend one at this time. He will require prolonged preparation      in order to insure that his colon is cleansed appropriately.  2. History of moderate-to-severe reflux esophagitis. He took three      weeks of Prevacid and he really needs to stay on the medication for      now.   PLAN:  1. Colonoscopy in near future with two-day prep.  2. __________ tablet, one daily. Samples provided, as well as new      prescription for a month's supply with 11      refills.  3. Patient is going to see Dr. Christena Flake later this afternoon. He is      anticipating having blood work done, therefore, we will let this be      arranged by Dr. Christena Flake.      Tana Coast, P.AJonathon Bellows, M.D.  Electronically  Signed    LL/MEDQ  D:  05/01/2006  T:  05/01/2006  Job:  [id number]

## 2010-09-21 NOTE — Consult Note (Signed)
NAME:  Jeremy Carr, Jeremy Carr            ACCOUNT NO.:  [id number]   MEDICAL RECORD NO.:  [id number]          PATIENT TYPE:  INP   LOCATION:  3705                         FACILITY:  [facility]   PHYSICIAN:  Casimiro Needle L. Reynolds, M.D.DATE OF BIRTH:  12/01/1945   DATE OF CONSULTATION:  DATE OF DISCHARGE:                                   CONSULTATION   REASON FOR EVALUATION:  Suspect transischemic attack.   HISTORY OF PRESENT ILLNESS:  This is an inpatient consultation evaluation of  this patient, a 65 year old man followed in the office by Dr. Sandria Manly for  Parkinson's disease.  He has had a lot of difficulty tolerating his  medications.  Recently had all medications stopped and then about a month  ago was restarted on Sinemet due to worsening motor function off of  medications.  The patient is admitted to the [facility]  Service for an event which occurred last night.  He and his wife both state  that about 7:00 p.m. he was sitting when he suddenly complained of a  sensation of shortness of breath and a feeling of heaviness in his chest  as if someone was hitting on my chest.  At this time, he says that he had  numbness and pain from the right shoulder down to the arm and running down  the right side of the body into the leg.  His wife took his blood pressure  and blood sugar which were okay and moved him into the kitchen.  EMS was  then alerted and came to the house.  His wife said that when EMS arrived, he  was distinctly weak on the right with difficulty gripping the right hand and  pushing down the right foot.  EMS brought him to [facility].  Unfortunately en route, the EMS was slowed by an accident and he had to come  down in a different ambulance.  By the time he arrived it was about 9:00  p.m. and his symptoms were all getting better.  He does say that he had a  recurrence of similar symptoms while in the emergency department about 4:00  this morning, again this has  improved.  He said that he has had the symptoms  like the shortness of breath and chest pain as before but the right sided  weakness is new for him.  He denies any history of any known stroke.  He  denies any alteration of consciousness, nausea, vomiting, difficulty with  speech with the event.  Neurologic consultation is subsequently requested  for further workup and management.   PAST MEDICAL HISTORY:  Remarkable for Parkinson's disease as above for which  he is followed in the office by Dr. Sandria Manly.  He has had some difficulty  tolerating several different medications including Stalevo and Mirapex.  He  also has a known history of bipolar disorder.  He is on medications for  that.  He also has a known history of COPD, benign prostatic hypertrophy and  osteoarthritis.  He was diagnosed with gallstones about 3 weeks ago after  complaint of chronic nausea and actually has an  appointment to see a surgeon  tomorrow.   FAMILY/SOCIAL/REVIEW OF SYSTEMS:  As outlined in H and P by Dr. Alwyn Ren  earlier today which is reviewed.   MEDICATIONS:  1. Sinemet 25/100 one tablet 4 times daily.  2. Midodrine 2.5 mg daily.  3. Lamictal 100 mg daily.  4. Zoloft 100 mg daily.  5. Lactulose.  6. Cogentin 1 mg 4 times daily.  7. Klonopin 1 mg t.i.d.  8. Cardura.  9. Tramadol p.r.n.   PHYSICAL EXAMINATION:  Vital signs: Temperature 98.4, blood pressure 134/74,  pulse 81, respirations 20, O2 sat 95% on 3-1/2 liters O2 nasal cannula.  General: This is a chronically ill appearing man supine in a hospital bed in  no distress.  HEENT: Cranium is normocephalic and atraumatic.  Oropharynx benign.  Neck: Supple without carotid or supraclavicular bruits.  Heart: Regular rate and rhythm without murmurs.  Neurologic: He is awake, alert, fully oriented to time, place and person.  Recent and remote memory are intact.  Attention span, concentration, fund of  knowledge are all appropriate.  Speech is fluent, not  dysarthric.  There are  no defects, confrontational naming and she can repeat a phrase.  Mood is  euthymic and affect appropriate.  Cranial nerves funduscopic exam is benign.  Pupils equal and briskly reactive.  Extraocular movements full without  nystagmus.  Visual fields full to confrontation.  Hearing is intact.  Conversational speech the patient is intact to pinprick.  Motor normal bulk  and tone.  Normal strength all muscles.  He surprisingly has no cogwheeling.  Sensation intact to light touch and pinprick and stimulation all  extremities.  Coordination rapid movements are formed a little bit slowly.  Finger to nose is adequate.  Gait is deferred.  Reflexes 2+ and symmetric.  Toes are downgoing bilaterally.   LABORATORY DATA:  CBC from last night white count 5.7, hemoglobin 13.9,  platelets 210,000 with normal differential.  PT is normal.  Chemistries are  normal.  Slightly elevated glucose of 109.  Liver functions and lipase are  normal.  Urinalysis is negative.  PSA is normal.  TSH and B12 at last admit  were normal.  CT of the head performed last night is personally reviewed.  This demonstrates no evidence of acute intracranial findings.  He is fairly  skewed in the scan and I really do not see much in terms of previous strokes  or small vessel disease.   IMPRESSION:  1. Transient right sided weakness and numbness versus left brain      transischemic attack or stroke, likely subcortical.  Also need to rule      out a cervical cord process given the sparing of the face.  2. Chest pain and shortness of breath, possible angina.  3. Gallstones.   RECOMMENDATIONS:  He needs TIA workup including MRA and MRI of the brain.  Carotid Doppler, 2D echocardiogram and stroke labs.  Will place him on  aspirin while the results are pending and also will have a therapist  evaluate him.  Would suggest a cardiology consult for chest pain and shortness of breath.  His wife also requests a  surgery consultation given  that he will miss his appointment with the surgeon for his gallstones  tomorrow and she is worried about the gallstones and chronic nausea.      Michael L. Thad Ranger, M.D.  Electronically Signed     MLR/MEDQ  D:  12/15/2005  T:  12/15/2005  Job:  [id number]  cc:   Titus Dubin. Alwyn Ren, MD,FACP,FCCP

## 2010-09-21 NOTE — Discharge Summary (Signed)
NAME:  Jeremy Carr, Jeremy Carr            ACCOUNT NO.:  [id number]   MEDICAL RECORD NO.:  [id number]          PATIENT TYPE:  INP   LOCATION:  A222                          FACILITY:  [facility]   PHYSICIAN:  Margaretmary Dys, M.D.DATE OF BIRTH:  May 11, 1945   DATE OF ADMISSION:  02/06/2006  DATE OF DISCHARGE:  10/07/2007LH                                 DISCHARGE SUMMARY   DISCHARGE DIAGNOSES:  1. Cholelithiasis with recurrent nausea and vomiting.  2. History of Parkinson's disease.  3. History of acute-on-chronic abdominal pain.  4. Chronic constipation.   DISCHARGE MEDICATIONS:  The patient is to resume all of his home  medications.   CONSULTATIONS OBTAINED DURING THIS ADMISSION:  1. Dr. Lionel December, GI, who recommended continuing PPI.  2. Surgical consult with Dr. Franky Macho. The patient declined any      surgical intervention at this time.   FOLLOW UP:  1. Follow up with Dr. Franky Macho as needed.  2. Follow up with primary care physician as previously scheduled.   DIET:  Regular diet.   ACTIVITY:  As tolerated.   HOSPITAL COURSE:  [name] is a 65 year old male who has Parkinson's  disease. The patient presented to the emergency room with complaints of  right lower quadrant abdominal pain. He also had some nausea and vomiting.  The patient is a poor historian due to his advanced Parkinson's disease.  Please kindly see the history and physical dictated on February 06, 2006 for  details. The patient did not have any fevers or chills. He had no shortness  of breath and no chest pain. He had evaluation in the emergency room which  showed an abdominal x-ray which revealed mucosal edema in the cecal region.  White blood cell count was 5.6, hemoglobin of 14.5, hematocrit of 42,  platelet count was 265 with no left shift. LFTs were normal. Lipase was 14.   The patient was started on IV fluids and also on Phenergan and Zofran as  needed. GI consult was obtained, and there was a  big suspicion that  patient's CT scan findings which showed some wall thickening in the sigmoid  colon was likely artifactual and that he had gallbladder disease. As a  result, I requested Dr. Franky Macho, the surgeon, to see him who said he  knew the patient fairly well from previous consultations regarding this  chronic abdominal pain.  The patient has declined surgery in the past, and he continues to decline  surgery. On February 09, 2006, the patient was doing very well. He said his  abdominal pain was gone. He was tolerating p.o. fluids and was subsequently  discharged home in satisfactory condition.      Margaretmary Dys, M.D.  Electronically Signed     AM/MEDQ  D:  03/29/2006  T:  03/29/2006  Job:  [id number]   cc:   Lionel December, M.D.  [id number]  [place]  [place] [address]   Dalia Heading, M.D.  Fax: [phone]

## 2010-09-21 NOTE — Discharge Summary (Signed)
NAME:  Jeremy Carr, Jeremy Carr            ACCOUNT NO.:  [id number]   MEDICAL RECORD NO.:  [id number]          PATIENT TYPE:  INP   LOCATION:  5733                         FACILITY:  [facility]   PHYSICIAN:  Rene Paci, M.D. LHCDATE OF BIRTH:  1945-10-25   DATE OF ADMISSION:  11/18/2005  DATE OF DISCHARGE:                                 DISCHARGE SUMMARY   DISCHARGE DIAGNOSES:  1.  Subacute exacerbation of movement disorder, likely Parkinson's disease;      improved with medication adjustment.  2.  History of bipolar disease.  3.  Chronic constipation.  4.  Benign prostatic hypertrophy with partial bladder outlet obstruction;      temporary Foley.  Continue Cardura, outpatient followup.  5.  Chronic obstructive pulmonary disease with history of tobacco, quit      smoking in 2005; continues oral chew.  6.  Mild hypertension, to be monitored on midodrine.   DISCHARGE MEDICATIONS:  1.  Sinemet 25/100 tablets one tablet as follows:  One 8 a.m., one-half at      11 a.m., one tablet at 2 p.m., and one tablet at 4 p.m. Ongoing      adjustment to be per Dr. Sandria Manly.  2.  Midodrine 2.5 mg once daily.  Monitor blood pressure while on this      medication.  3.  Lamictal 25 mg once daily as prior to admission.  4.  Zoloft 100 mg once daily, increased from previous 50 mg dose.  5.  Lactulose solution 15 mL daily for constipation.  6.  Cogentin 1 mg q.6h.  7.  Klonopin 1 mg t.i.d.  8.  Cardura 8 mg once daily.   HOSPITAL FOLLOWUP:  Will be with neurologist, Dr. Sandria Manly, at family  convenience on Monday, November 25, 2005, to review medication management and  continued titration as needed for best control of his Parkinson's.  Family  member to contact primary care physician for Dr. Amador Cunas to follow up as  needed, and with the psychiatrist as needed.   CONDITION ON DISCHARGE:  Medically improved and stable status post PT  evaluation with marked improvement post medication adjustment.   DISPOSITION:   The patient is discharged home where he lives with his wife  and is cared [place] extended [place].  Improved and stable for discharge home.   HOSPITAL COURSE BY PROBLEM:  WEAKNESS WITH MOVEMENT DISORDER.  The patient  is a 65 year old gentleman with apparent Parkinson's disease who in the last  8 weeks undergoing medication adjustment off his Sinemet to be managed with  Lamictal alone due to hallucinations, other problems, and apparently unclear  diagnosis.  Since that time the patient had had progressive failure to  thrive and was unable to be cared for by family at home for complaints of  urinary frequency as well as shortness of breath.  The primary distress was  his lack of coordination.  He was admitted and evaluated by the neurology  service who felt that he needed medication adjustment to resume his Sinemet.  This was done initially with Dr. Sharene Skeans and further  management with Dr.  Sandria Manly.  The patient has had no hallucinations with resuming this medications  and a PT evaluation within 24 hours notes marked improvement with resumption  of these treatments and the patient is now felt stable for discharge home at  his current status with continued anticipation of further improvement with  medication adjustment.  This has been discussed thoroughly not only with the  patient but with wife who is primary caregiver, along with other extended  family members nearby their home.  Continue home management and outpatient  followup with Dr. Sandria Manly as needed.      Rene Paci, M.D. [facility]  Electronically Signed     VL/MEDQ  D:  11/20/2005  T:  11/20/2005  Job:  [id number]

## 2010-09-21 NOTE — Consult Note (Signed)
NAME:  Jeremy Carr, KORN            ACCOUNT NO.:  [id number]   MEDICAL RECORD NO.:  [id number]          PATIENT TYPE:  INP   LOCATION:  5733                         FACILITY:  [facility]   PHYSICIAN:  Deanna Artis. Hickling, M.D.DATE OF BIRTH:  [birth year]   DATE OF CONSULTATION:  11/18/2005  DATE OF DISCHARGE:                                   CONSULTATION   CHIEF COMPLAINT:  Cannot breathe.   HISTORY OF PRESENT ILLNESS:  A 65 year old with Parkinson-like syndrome  diagnosed at age 5.  He has been on a variety of L-dopa medications.  He  has had steady decline over the past couple years from walking independently  with canes to being wheelchair bound.  This accelerated when he was taken  off Stalevo over the past 6 weeks.  He finished that less than a week ago.   The patient was brought to [facility] on Thursday, July 12, with  nausea, diagnosed with gallbladder disease with gallstones.  No specific  therapy was given.  The patient had dry heaves this morning.  The patient  has chronic obstructive pulmonary disease and has had 45-pack years of  smoking and by history quit about 3 years ago.  He continues to chew tobacco  as of the most recent primary care visit.   The patient had hallucinations 2-3 months ago and was taken off Mirapex and  Lyrica.  Lamictal was started in the last few days with a dose of 25 mg/day  for bipolar disease.  He says it makes him sleepy.   The patient was noted to have increasing shortness of breath today and  power.  He was brought to the [facility] Emergency Room after his wife came  home at 2 o'clock and found him that way.  She called EMS and he was brought  to [facility] just before 3 p.m.   The patient was evaluated by the emergency room physicians, who thought he  had pneumonia.  Indeed, there is a bibasilar infiltrate, but it may be  atelectasis as opposed to pneumonic infiltrate.  The patient was admitted by  Dr. Illene Regulus for  failure to thrive.   PAST MEDICAL HISTORY:  Remarkable for:  1.  Chronic pain syndrome.  2.  Chronic obstructive pulmonary disease.  3.  Degenerative joint disease.  4.  Benign prostatic hypertrophy.  5.  Erectile dysfunction.  6.  Chronic low back pain.   PAST SURGICAL HISTORY:  Remote appendectomy and tonsillectomy, the latter in  1950.   REVIEW OF SYSTEMS:  Malaise, insomnia, decreased appetite, frozen gait.  No  obvious signs of intercurrent infection, no bleeding dyscrasias, no systemic  arthritis.  Apparently has osteoarthritis of the knees.   FAMILY HISTORY:  Father died of complications of congestive heart failure.  Mother died related to dementia.  The patient has a sister who has  hypertension, rheumatoid arthritis, and a recent cerebrovascular accident.   SOCIAL HISTORY:  The patient, by history, chews tobacco.  He has prior abuse  of alcohol, but none recently.  He has been totally dependent on others,  except for  feeding himself.  His wife works part time until about 2 p.m.  weekdays, and the neighbors watch him while she is gone.   CURRENT MEDICATIONS:  1.  Clonazepam 1 mg 3 times a day.  2.  Parcopa 25/100 one at 8 a.m. and 1 at 5 p.m.  3.  Sertraline 50 mg 1-1/2 daily.  4.  Doxazosin 8 mg 1/2 daily.  5.  Benztropine 1 mg every 6 hours.  6.  Ciprofloxacin 500 mg twice daily.  7.  Lamictal 25 mg daily.   PRN medications:  1.  Tramadol 50 mg 4 times day.  2.  Phenergan 25 mg 1/2 every 6 hours as needed for nausea.   DRUG ALLERGIES/INTOLERANCES:  HALOPERIDOL.   EXAMINATION:  VITAL SIGNS:  Blood pressure 99/63, resting pulse 79,  respirations 22, oxygen saturation 94%, temperature 97.4.  EAR, NOSE, AND THROAT:  No infections.  NECK:  Supple, no bruits.  LUNGS:  Distant breath sounds.  HEART:  Distant heart sounds, normal pulses.  ABDOMEN:  Soft, bowel sounds normal, no hepatosplenomegaly.  EXTREMITIES:  Normal.  NEUROLOGIC:  Mini mental status exam 26/30.   He made errors in date and was  not able to spell the word wolf backwards correctly, or subtract 7 from 100  serially without at least 2 mistakes.  The rest of the exam was fine.  He  was able to name 17 animals in 1 minute.  Cranial nerves:  Round, reactive  pupils from 4 mm to 3.5.  Fundi were normal, visual fields full, extraocular  movements full.  He has an impassive face and dysphonia in his speech.  He  has a midline tongue.  Motor examination:  Excellent strength, increased  tone which is rigid, arms more so than legs.  He has mild cogwheeling.  He  has a minimal tremor.  Sensory examination:  Mild peripheral neuropathy,  good stereognosis.  Cerebellar examination:  Good finger to nose.  Rapid  repetitive movements are somewhat slow and dysmetric.  Deep tendon reflexes  are symmetric and diminished, but equal.  The patient had bilateral flexor  plantar responses.   IMPRESSION:  Parkinson's syndrome, 332.1.  I am not certain why this is not  an idiopathic Parkinson's disease and will discuss that with Dr. Sandria Manly.  Our  options are to increase Parcopa, restart Stalevo.  The patient wants to stop  Lamictal because it makes him sleepy.  I will discuss these issues with Dr.  Sandria Manly and likely have him assume consultative care for this gentleman who has  been admitted by [facility].  I appreciate the opportunity to  participate in his care.  If you have questions or if I can be of  assistance, do not hesitate to contact me.   I have reviewed his laboratories today, including the chest x-ray which is  noted above.  Urinalysis:  Specific gravity 1.018, pH 7.  Chemistries  negative.  White blood cell count 5300, hemoglobin 14.8, hematocrit 43.6,  MCV 89.4, platelet count 224,000; 57 polys, absolute granulocyte count 3000,  lymphocytes 33, monocytes 8, eosinophils 2, basophils 1.  Sodium 140, potassium 4.0, chloride 106, BUN 14, glucose 94, pH 7.36,  creatinine 1.2.  D-dimer was  very much greater than 20.  CT angiogram of the  chest, looking for pulmonary embolus, failed to show that.  The patient had  changes of chronic obstructive pulmonary disease with patchy airspace  infiltrates posteriorly in the lower lobes, which raised the question of  aspiration.  The patient takes a soft mechanical diet.      Deanna Artis. Sharene Skeans, M.D.  Electronically Signed     [facility]MED[facility]  D:  11/18/2005  T:  11/19/2005  Job:  [id number]   cc:   Rosalyn Gess. Norins, M.D. [facility]  [address]  [place]  [place] [address]   Gordy Savers, M.D. [facility]  [address] [place]  [place] [address]

## 2010-10-11 ENCOUNTER — Encounter (INDEPENDENT_AMBULATORY_CARE_PROVIDER_SITE_OTHER): Payer: Medicare Other | Admitting: Psychiatry

## 2010-10-11 DIAGNOSIS — F3189 Other bipolar disorder: Secondary | ICD-10-CM

## 2011-01-10 ENCOUNTER — Encounter (HOSPITAL_COMMUNITY): Payer: Medicare Other | Admitting: Psychiatry

## 2011-01-31 ENCOUNTER — Encounter (HOSPITAL_COMMUNITY): Payer: Medicare Other | Admitting: Psychiatry

## 2011-01-31 LAB — URINALYSIS, ROUTINE W REFLEX MICROSCOPIC
Bilirubin Urine: NEGATIVE
Hgb urine dipstick: NEGATIVE
Ketones, ur: NEGATIVE
Nitrite: NEGATIVE
pH: 6

## 2011-01-31 LAB — CBC
HCT: 40.9
MCHC: 33
MCV: 90.5
Platelets: 235
RDW: 13.8

## 2011-01-31 LAB — DIFFERENTIAL
Basophils Absolute: 0
Basophils Relative: 1
Eosinophils Relative: 1
Lymphocytes Relative: 27
Monocytes Absolute: 0.4
Monocytes Relative: 8

## 2011-01-31 LAB — BASIC METABOLIC PANEL
BUN: 13
Chloride: 106
Glucose, Bld: 106 — ABNORMAL HIGH
Potassium: 4.6

## 2011-02-01 LAB — COMPREHENSIVE METABOLIC PANEL
BUN: 14
CO2: 30
Calcium: 9.2
Chloride: 104
Creatinine, Ser: 1.32
GFR calc Af Amer: >60
GFR calc non Af Amer: 55 — ABNORMAL LOW
Glucose, Bld: 107 — ABNORMAL HIGH
Total Bilirubin: 0.3

## 2011-02-01 LAB — DIFFERENTIAL
Basophils Absolute: 0.1
Eosinophils Relative: 0
Lymphocytes Relative: 19
Lymphs Abs: 1.2
Neutro Abs: 4.5
Neutrophils Relative %: 72

## 2011-02-01 LAB — CBC
HCT: 40.1
MCHC: 33.5
MCV: 89.2
RBC: 4.5
WBC: 6.3

## 2011-02-07 ENCOUNTER — Encounter (INDEPENDENT_AMBULATORY_CARE_PROVIDER_SITE_OTHER): Payer: Medicare Other | Admitting: Psychiatry

## 2011-02-07 DIAGNOSIS — F3189 Other bipolar disorder: Secondary | ICD-10-CM

## 2011-04-20 ENCOUNTER — Other Ambulatory Visit (HOSPITAL_COMMUNITY): Payer: Self-pay

## 2011-05-02 ENCOUNTER — Encounter (HOSPITAL_COMMUNITY): Payer: Medicare Other | Admitting: Psychiatry

## 2011-05-14 ENCOUNTER — Ambulatory Visit (INDEPENDENT_AMBULATORY_CARE_PROVIDER_SITE_OTHER): Payer: Medicare Other | Admitting: Psychiatry

## 2011-05-14 ENCOUNTER — Encounter (HOSPITAL_COMMUNITY): Payer: Medicare Other | Admitting: Psychiatry

## 2011-05-14 DIAGNOSIS — F319 Bipolar disorder, unspecified: Secondary | ICD-10-CM

## 2011-05-14 MED ORDER — LAMOTRIGINE 150 MG PO TABS: 150.0000 mg | ORAL_TABLET | Freq: Every day | ORAL | Status: DC

## 2011-05-14 MED ORDER — SERTRALINE HCL 100 MG PO TABS: 100.0000 mg | ORAL_TABLET | Freq: Every day | ORAL | Status: DC

## 2011-05-14 NOTE — Progress Notes (Signed)
Patient came for his followup appointment with his wife. Patient is stable on his current medication however his Parkinson his continued to get worse. His memory has been deteriorated. He also reported visual hallucination and dreams at night but there is no agitation anger or depressive thoughts. He continues to have shakes and weakness in the legs and requires wheelchair for ambulation. Overall wife endorse that his mood has been stable and there has been no violent episodes. Patient reported no side effects of medication. His Seroquel has been reduced 6 months ago as patient has been complaining of excessive sedation. Currently he is on Seroquel 200 mg in the morning and 400 mg at bedtime. Is also on Zoloft 200 mg and Lamictal 150 daily. He denies any itching or rash with Lamictal.  Mental status examination Patient is fairly groomed. His speech is slow with difficulty expressing his thoughts. He has poor attention and poor concentration. He has difficulty to remember work things. His thought process is slow. He denies any active or passive suicidal thinking and homicidal thinking. There no psychotic symptoms present at this time. He's alert and oriented x3. His insight judgment and pulse control is okay  Assessment Bipolar disorder  Plan We will continue his current medication. He is getting Seroquel from local health department. I explained risks and benefits of medication in detail. I recommended to call us if he has any question or concern of the medication or if he feel worsening of her symptoms. I will see him again in 3 months

## 2011-08-13 ENCOUNTER — Ambulatory Visit (INDEPENDENT_AMBULATORY_CARE_PROVIDER_SITE_OTHER): Payer: Medicare Other | Admitting: Psychiatry

## 2011-08-13 ENCOUNTER — Encounter (HOSPITAL_COMMUNITY): Payer: Self-pay | Admitting: Psychiatry

## 2011-08-13 DIAGNOSIS — F319 Bipolar disorder, unspecified: Secondary | ICD-10-CM

## 2011-08-13 NOTE — Progress Notes (Signed)
Chief complaint Medication management and followup.  History of presenting illness Patient is 66 year old Caucasian married male who came for his followup appointment with his wife.  Recently patient complained of increased anxiety , tremors and hallucinations .  Patient was recently seen by his neurologist who has increased his Parkinson medication .  Patient noticed since then he is feeling more anxious and having hallucinations .  As per wife patient sleep fine .  He denies any agitation or anger.  He continues to have bad dreams but they are less intense and less frequent.   He denies any crying spells or any recent depressive thoughts .  He likes Seroquel , Zoloft and Lamictal.  He has no rash .  He denies any active or passive suicidal thinking and homicidal thinking.    Current psychiatric medication Seroquel 200 mg in the morning and 400 mg at bedtime Zoloft 200 mg daily Lamictal 150 mg daily  Medical history Patient has history of hypothyroidism, hyperlipidemia, Parkinson disease, tinnitus, hypertension, COPD, overactive bladder, arthritis, degenerative disc disease and history of a stroke .  He uses wheelchair .    Mental status examination Patient is fairly groomed.  He is cooperative and maintained fair eye contact.  His speech is slow with difficulty expressing his thoughts. He has poor attention and poor concentration. He has difficulty to remember things. His thought process is slow. He denies any active or passive suicidal thinking and homicidal thinking. There no psychotic symptoms present at this time. He's alert and oriented x3. His insight judgment and pulse control is okay  Assessment Axis I Bipolar disorder Axis II deferred Axis III see medical history Axis IV mild to moderate Axis V 50-55  Plan I recommend to contact his neurologist to address side effects issues with increased Parkinson medication .  I will continue his current medication. He is getting Seroquel from  local health department. I explained risks and benefits of medication in detail. I recommended to call us if he has any question or concern of the medication or if he feel worsening of her symptoms. I will see him again in 3 months

## 2011-11-12 ENCOUNTER — Encounter (HOSPITAL_COMMUNITY): Payer: Self-pay | Admitting: Psychiatry

## 2011-11-12 ENCOUNTER — Ambulatory Visit (INDEPENDENT_AMBULATORY_CARE_PROVIDER_SITE_OTHER): Payer: Medicare Other | Admitting: Psychiatry

## 2011-11-12 DIAGNOSIS — F319 Bipolar disorder, unspecified: Secondary | ICD-10-CM
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
## 2011-11-25 ENCOUNTER — Ambulatory Visit: Payer: Self-pay | Admitting: Gastroenterology

## 2011-11-26 ENCOUNTER — Encounter: Payer: Self-pay | Admitting: Internal Medicine

## 2011-11-27 ENCOUNTER — Ambulatory Visit: Payer: Self-pay | Admitting: Gastroenterology

## 2012-02-11 ENCOUNTER — Encounter (HOSPITAL_COMMUNITY): Payer: Self-pay | Admitting: Psychiatry

## 2012-02-11 ENCOUNTER — Ambulatory Visit (INDEPENDENT_AMBULATORY_CARE_PROVIDER_SITE_OTHER): Payer: Medicare Other | Admitting: Psychiatry

## 2012-02-11 DIAGNOSIS — F319 Bipolar disorder, unspecified: Secondary | ICD-10-CM
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
## 2012-05-13 ENCOUNTER — Ambulatory Visit (HOSPITAL_COMMUNITY): Payer: Self-pay | Admitting: Psychiatry

## 2012-05-21 ENCOUNTER — Encounter (HOSPITAL_COMMUNITY): Payer: Self-pay | Admitting: Psychiatry

## 2012-05-21 ENCOUNTER — Ambulatory Visit (INDEPENDENT_AMBULATORY_CARE_PROVIDER_SITE_OTHER): Payer: Medicare Other | Admitting: Psychiatry

## 2012-05-21 VITALS — Ht 72.0 in | Wt 214.4 lb

## 2012-05-21 DIAGNOSIS — F411 Generalized anxiety disorder: Secondary | ICD-10-CM

## 2012-05-21 DIAGNOSIS — F319 Bipolar disorder, unspecified: Secondary | ICD-10-CM

## 2012-05-21 MED ORDER — QUETIAPINE FUMARATE 300 MG PO TABS: 300.0000 mg | ORAL_TABLET | Freq: Every day | ORAL | Status: DC

## 2012-05-21 MED ORDER — BENZTROPINE MESYLATE 1 MG PO TABS: 1.0000 mg | ORAL_TABLET | Freq: Three times a day (TID) | ORAL | Status: DC

## 2012-05-21 MED ORDER — LAMOTRIGINE 150 MG PO TABS: 150.0000 mg | ORAL_TABLET | Freq: Every day | ORAL | Status: DC

## 2012-05-21 MED ORDER — QUETIAPINE FUMARATE 200 MG PO TABS: 200.0000 mg | ORAL_TABLET | Freq: Two times a day (BID) | ORAL | Status: DC

## 2012-05-21 MED ORDER — SERTRALINE HCL 100 MG PO TABS: 100.0000 mg | ORAL_TABLET | Freq: Every day | ORAL | Status: DC
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
## 2012-05-21 NOTE — Patient Instructions (Signed)
To prevent dementia Play cards or broad games Learn a musical instrument Dancing Use the other hand to brush teeth or open doors Get regular exercise and read or study some new subject  CUT BACK/CUT OUT on sugar and carbohydrates, that means very limited fruits and starchy vegetables and very limited grains, breads  The goal is low GLYCEMIC INDEX.  CUT OUT all wheat, rye, or barley for the GLUTEN in them.  HIGH fat and LOW carbohydrate diet is the KEY.  Eat avocados, eggs, lean meat like grass fed beef and chicken  Nuts and seeds would be good foods as well.   Stevia is an excellent sweetener.  Safe for the brain.   Almond butter is awesome.  Check out all this on the Internet.  Call if problems or concerns.

## 2012-06-30 ENCOUNTER — Other Ambulatory Visit: Payer: Self-pay

## 2012-06-30 ENCOUNTER — Encounter (HOSPITAL_COMMUNITY): Payer: Self-pay | Admitting: Physical Medicine and Rehabilitation

## 2012-06-30 ENCOUNTER — Emergency Department (HOSPITAL_COMMUNITY): Payer: Medicare Other

## 2012-06-30 ENCOUNTER — Ambulatory Visit (HOSPITAL_COMMUNITY): Admit: 2012-06-30 | Payer: Self-pay | Admitting: Cardiovascular Disease

## 2012-06-30 ENCOUNTER — Inpatient Hospital Stay (HOSPITAL_COMMUNITY)
Admission: EM | Admit: 2012-06-30 | Discharge: 2012-07-06 | DRG: 280 | Disposition: A | Discharge status: Skilled Nursing Facility | Payer: Medicare Other | Attending: Internal Medicine | Admitting: Internal Medicine

## 2012-06-30 ENCOUNTER — Encounter (HOSPITAL_COMMUNITY): Payer: Self-pay

## 2012-06-30 DIAGNOSIS — F319 Bipolar disorder, unspecified: Secondary | ICD-10-CM

## 2012-06-30 DIAGNOSIS — Z79899 Other long term (current) drug therapy: Secondary | ICD-10-CM

## 2012-06-30 DIAGNOSIS — R32 Unspecified urinary incontinence: Secondary | ICD-10-CM

## 2012-06-30 DIAGNOSIS — N179 Acute kidney failure, unspecified: Secondary | ICD-10-CM

## 2012-06-30 DIAGNOSIS — J9601 Acute respiratory failure with hypoxia: Secondary | ICD-10-CM

## 2012-06-30 DIAGNOSIS — N19 Unspecified kidney failure: Secondary | ICD-10-CM

## 2012-06-30 DIAGNOSIS — R680 Hypothermia, not associated with low environmental temperature: Secondary | ICD-10-CM | POA: Diagnosis present

## 2012-06-30 DIAGNOSIS — E039 Hypothyroidism, unspecified: Secondary | ICD-10-CM

## 2012-06-30 DIAGNOSIS — M5137 Other intervertebral disc degeneration, lumbosacral region: Secondary | ICD-10-CM

## 2012-06-30 DIAGNOSIS — F064 Anxiety disorder due to known physiological condition: Secondary | ICD-10-CM | POA: Diagnosis present

## 2012-06-30 DIAGNOSIS — G20A1 Parkinson's disease without dyskinesia, without mention of fluctuations: Secondary | ICD-10-CM

## 2012-06-30 DIAGNOSIS — J4489 Other specified chronic obstructive pulmonary disease: Secondary | ICD-10-CM

## 2012-06-30 DIAGNOSIS — Z860100 Personal history of colon polyps, unspecified: Secondary | ICD-10-CM

## 2012-06-30 DIAGNOSIS — M545 Low back pain, unspecified: Secondary | ICD-10-CM

## 2012-06-30 DIAGNOSIS — N318 Other neuromuscular dysfunction of bladder: Secondary | ICD-10-CM

## 2012-06-30 DIAGNOSIS — Z818 Family history of other mental and behavioral disorders: Secondary | ICD-10-CM

## 2012-06-30 DIAGNOSIS — M129 Arthropathy, unspecified: Secondary | ICD-10-CM

## 2012-06-30 DIAGNOSIS — R0902 Hypoxemia: Secondary | ICD-10-CM | POA: Diagnosis present

## 2012-06-30 DIAGNOSIS — H9319 Tinnitus, unspecified ear: Secondary | ICD-10-CM

## 2012-06-30 DIAGNOSIS — Z888 Allergy status to other drugs, medicaments and biological substances status: Secondary | ICD-10-CM

## 2012-06-30 DIAGNOSIS — R748 Abnormal levels of other serum enzymes: Secondary | ICD-10-CM

## 2012-06-30 DIAGNOSIS — R4182 Altered mental status, unspecified: Secondary | ICD-10-CM

## 2012-06-30 DIAGNOSIS — K5909 Other constipation: Secondary | ICD-10-CM

## 2012-06-30 DIAGNOSIS — R5381 Other malaise: Secondary | ICD-10-CM | POA: Diagnosis present

## 2012-06-30 DIAGNOSIS — R578 Other shock: Secondary | ICD-10-CM | POA: Diagnosis present

## 2012-06-30 DIAGNOSIS — R1319 Other dysphagia: Secondary | ICD-10-CM

## 2012-06-30 DIAGNOSIS — R609 Edema, unspecified: Secondary | ICD-10-CM

## 2012-06-30 DIAGNOSIS — I4901 Ventricular fibrillation: Principal | ICD-10-CM

## 2012-06-30 DIAGNOSIS — Z7982 Long term (current) use of aspirin: Secondary | ICD-10-CM

## 2012-06-30 DIAGNOSIS — E785 Hyperlipidemia, unspecified: Secondary | ICD-10-CM

## 2012-06-30 DIAGNOSIS — E876 Hypokalemia: Secondary | ICD-10-CM | POA: Diagnosis present

## 2012-06-30 DIAGNOSIS — Z87891 Personal history of nicotine dependence: Secondary | ICD-10-CM

## 2012-06-30 DIAGNOSIS — L570 Actinic keratosis: Secondary | ICD-10-CM

## 2012-06-30 DIAGNOSIS — M6282 Rhabdomyolysis: Secondary | ICD-10-CM | POA: Diagnosis present

## 2012-06-30 DIAGNOSIS — F3289 Other specified depressive episodes: Secondary | ICD-10-CM

## 2012-06-30 DIAGNOSIS — I214 Non-ST elevation (NSTEMI) myocardial infarction: Secondary | ICD-10-CM

## 2012-06-30 DIAGNOSIS — I469 Cardiac arrest, cause unspecified: Secondary | ICD-10-CM

## 2012-06-30 DIAGNOSIS — F411 Generalized anxiety disorder: Secondary | ICD-10-CM

## 2012-06-30 DIAGNOSIS — G20 Parkinson's disease: Secondary | ICD-10-CM | POA: Diagnosis present

## 2012-06-30 DIAGNOSIS — Z6379 Other stressful life events affecting family and household: Secondary | ICD-10-CM

## 2012-06-30 DIAGNOSIS — J9600 Acute respiratory failure, unspecified whether with hypoxia or hypercapnia: Secondary | ICD-10-CM

## 2012-06-30 DIAGNOSIS — J9691 Respiratory failure, unspecified with hypoxia: Secondary | ICD-10-CM

## 2012-06-30 DIAGNOSIS — N400 Enlarged prostate without lower urinary tract symptoms: Secondary | ICD-10-CM

## 2012-06-30 DIAGNOSIS — I498 Other specified cardiac arrhythmias: Secondary | ICD-10-CM | POA: Diagnosis present

## 2012-06-30 DIAGNOSIS — K219 Gastro-esophageal reflux disease without esophagitis: Secondary | ICD-10-CM

## 2012-06-30 DIAGNOSIS — J810 Acute pulmonary edema: Secondary | ICD-10-CM

## 2012-06-30 DIAGNOSIS — Z8679 Personal history of other diseases of the circulatory system: Secondary | ICD-10-CM

## 2012-06-30 DIAGNOSIS — J690 Pneumonitis due to inhalation of food and vomit: Secondary | ICD-10-CM | POA: Diagnosis present

## 2012-06-30 DIAGNOSIS — J449 Chronic obstructive pulmonary disease, unspecified: Secondary | ICD-10-CM | POA: Diagnosis present

## 2012-06-30 DIAGNOSIS — M51379 Other intervertebral disc degeneration, lumbosacral region without mention of lumbar back pain or lower extremity pain: Secondary | ICD-10-CM

## 2012-06-30 DIAGNOSIS — I10 Essential (primary) hypertension: Secondary | ICD-10-CM

## 2012-06-30 DIAGNOSIS — K8020 Calculus of gallbladder without cholecystitis without obstruction: Secondary | ICD-10-CM

## 2012-06-30 DIAGNOSIS — R42 Dizziness and giddiness: Secondary | ICD-10-CM

## 2012-06-30 DIAGNOSIS — T68XXXA Hypothermia, initial encounter: Secondary | ICD-10-CM

## 2012-06-30 LAB — CBC WITH DIFFERENTIAL/PLATELET
Basophils Absolute: 0 K/uL (ref 0.0–0.1)
Basophils Relative: 0 % (ref 0–1)
Eosinophils Absolute: 0 10*3/uL (ref 0.0–0.7)
Eosinophils Relative: 0 % (ref 0–5)
HCT: 37.3 % — ABNORMAL LOW (ref 39.0–52.0)
Hemoglobin: 12.3 g/dL — ABNORMAL LOW (ref 13.0–17.0)
Lymphocytes Relative: 11 % — ABNORMAL LOW (ref 12–46)
Lymphs Abs: 1.1 10*3/uL (ref 0.7–4.0)
MCH: 30.1 pg (ref 26.0–34.0)
MCHC: 33 g/dL (ref 30.0–36.0)
MCV: 91.4 fL (ref 78.0–100.0)
Monocytes Absolute: 0.5 10*3/uL (ref 0.1–1.0)
Monocytes Relative: 5 % (ref 3–12)
Neutro Abs: 8.1 K/uL — ABNORMAL HIGH (ref 1.7–7.7)
Neutrophils Relative %: 84 % — ABNORMAL HIGH (ref 43–77)
Platelets: 230 K/uL (ref 150–400)
RBC: 4.08 MIL/uL — ABNORMAL LOW (ref 4.22–5.81)
RDW: 14.7 % (ref 11.5–15.5)
WBC: 9.8 K/uL (ref 4.0–10.5)

## 2012-06-30 LAB — URINALYSIS, ROUTINE W REFLEX MICROSCOPIC
Glucose, UA: NEGATIVE mg/dL
Ketones, ur: NEGATIVE mg/dL
Nitrite: NEGATIVE
Protein, ur: 100 mg/dL — AB
Specific Gravity, Urine: 1.02 (ref 1.005–1.030)
Urobilinogen, UA: 1 mg/dL (ref 0.0–1.0)
pH: 6 (ref 5.0–8.0)

## 2012-06-30 LAB — STREP PNEUMONIAE URINARY ANTIGEN: Strep Pneumo Urinary Antigen: NEGATIVE

## 2012-06-30 LAB — BASIC METABOLIC PANEL
Calcium: 8.6 mg/dL (ref 8.4–10.5)
Chloride: 114 mEq/L — ABNORMAL HIGH (ref 96–112)
GFR calc Af Amer: 36 mL/min — ABNORMAL LOW (ref >90)
GFR calc Af Amer: 45 mL/min — ABNORMAL LOW (ref >90)
GFR calc non Af Amer: 31 mL/min — ABNORMAL LOW (ref >90)
GFR calc non Af Amer: 39 mL/min — ABNORMAL LOW (ref >90)
Glucose, Bld: 143 mg/dL — ABNORMAL HIGH (ref 70–99)
Potassium: 2.8 mEq/L — ABNORMAL LOW (ref 3.5–5.1)
Potassium: 3.1 mEq/L — ABNORMAL LOW (ref 3.5–5.1)
Sodium: 142 mEq/L (ref 135–145)
Sodium: 143 mEq/L (ref 135–145)

## 2012-06-30 LAB — POCT I-STAT 3, ART BLOOD GAS (G3+)
Acid-base deficit: 8 mmol/L — ABNORMAL HIGH (ref 0.0–2.0)
Bicarbonate: 16.9 mEq/L — ABNORMAL LOW (ref 20.0–24.0)
O2 Saturation: 96 %
Patient temperature: 83.6
TCO2: 18 mmol/L (ref 0–100)
pCO2 arterial: 21 mmHg — ABNORMAL LOW (ref 35.0–45.0)
pH, Arterial: 7.477 — ABNORMAL HIGH (ref 7.350–7.450)
pO2, Arterial: 51 mmHg — ABNORMAL LOW (ref 80.0–100.0)

## 2012-06-30 LAB — LACTIC ACID, PLASMA: Lactic Acid, Venous: 1.7 mmol/L (ref 0.5–2.2)

## 2012-06-30 LAB — COMPREHENSIVE METABOLIC PANEL
Alkaline Phosphatase: 143 U/L — ABNORMAL HIGH (ref 39–117)
BUN: 39 mg/dL — ABNORMAL HIGH (ref 6–23)
Calcium: 8.7 mg/dL (ref 8.4–10.5)
Creatinine, Ser: 2.39 mg/dL — ABNORMAL HIGH (ref 0.50–1.35)
GFR calc Af Amer: 30 mL/min — ABNORMAL LOW (ref >90)
Glucose, Bld: 199 mg/dL — ABNORMAL HIGH (ref 70–99)
Potassium: 3.6 mEq/L (ref 3.5–5.1)
Total Protein: 7.2 g/dL (ref 6.0–8.3)

## 2012-06-30 LAB — TROPONIN I
Troponin I: 0.31 ng/mL (ref <0.30)
Troponin I: 6.79 ng/mL (ref <0.30)

## 2012-06-30 LAB — COMPREHENSIVE METABOLIC PANEL WITH GFR
ALT: 22 U/L (ref 0–53)
AST: 302 U/L — ABNORMAL HIGH (ref 0–37)
Albumin: 3.2 g/dL — ABNORMAL LOW (ref 3.5–5.2)
CO2: 17 meq/L — ABNORMAL LOW (ref 19–32)
Chloride: 103 meq/L (ref 96–112)
GFR calc non Af Amer: 26 mL/min — ABNORMAL LOW (ref >90)
Sodium: 140 meq/L (ref 135–145)
Total Bilirubin: 0.3 mg/dL (ref 0.3–1.2)

## 2012-06-30 LAB — URINE MICROSCOPIC-ADD ON

## 2012-06-30 LAB — PROTIME-INR
INR: 1.4 (ref 0.00–1.49)
Prothrombin Time: 16.8 seconds — ABNORMAL HIGH (ref 11.6–15.2)

## 2012-06-30 LAB — APTT: aPTT: 30 s (ref 24–37)

## 2012-06-30 LAB — POCT I-STAT 3, VENOUS BLOOD GAS (G3P V)
Acid-base deficit: 10 mmol/L — ABNORMAL HIGH (ref 0.0–2.0)
Bicarbonate: 19.4 meq/L — ABNORMAL LOW (ref 20.0–24.0)
O2 Saturation: 98 %
TCO2: 21 mmol/L (ref 0–100)
pCO2, Ven: 57.8 mmHg — ABNORMAL HIGH (ref 45.0–50.0)
pH, Ven: 7.134 — CL (ref 7.250–7.300)
pO2, Ven: 129 mmHg — ABNORMAL HIGH (ref 30.0–45.0)

## 2012-06-30 LAB — CG4 I-STAT (LACTIC ACID): Lactic Acid, Venous: 4.32 mmol/L — ABNORMAL HIGH (ref 0.5–2.2)

## 2012-06-30 LAB — PRO B NATRIURETIC PEPTIDE: Pro B Natriuretic peptide (BNP): 235.7 pg/mL — ABNORMAL HIGH (ref 0–125)

## 2012-06-30 LAB — CORTISOL: Cortisol, Plasma: 28 ug/dL

## 2012-06-30 LAB — MRSA PCR SCREENING: MRSA by PCR: NEGATIVE

## 2012-06-30 LAB — MAGNESIUM: Magnesium: 2.5 mg/dL (ref 1.5–2.5)

## 2012-06-30 LAB — PHOSPHORUS: Phosphorus: 5.6 mg/dL — ABNORMAL HIGH (ref 2.3–4.6)

## 2012-06-30 SURGERY — LEFT HEART CATHETERIZATION WITH CORONARY ANGIOGRAM
Anesthesia: LOCAL

## 2012-06-30 MED ORDER — SODIUM CHLORIDE 0.9 % IV BOLUS (SEPSIS): 1000.0000 mL | Freq: Once | INTRAVENOUS | Status: DC

## 2012-06-30 MED ORDER — ASPIRIN 300 MG RE SUPP
300.0000 mg | Freq: Once | RECTAL | Status: AC
  Administered 2012-06-30: 300 mg via RECTAL
  Filled 2012-06-30: qty 1

## 2012-06-30 MED ORDER — HEPARIN BOLUS VIA INFUSION
2850.0000 [IU] | Freq: Once | INTRAVENOUS | Status: AC
  Administered 2012-06-30: 2850 [IU] via INTRAVENOUS
  Filled 2012-06-30: qty 2850

## 2012-06-30 MED ORDER — SODIUM BICARBONATE 8.4 % IV SOLN
INTRAVENOUS | Status: AC | PRN
  Administered 2012-06-30: 50 meq via INTRAVENOUS

## 2012-06-30 MED ORDER — SODIUM CHLORIDE 0.9 % IV SOLN: INTRAVENOUS | Status: DC

## 2012-06-30 MED ORDER — HEPARIN SODIUM (PORCINE) 5000 UNIT/ML IJ SOLN
5000.0000 [IU] | Freq: Three times a day (TID) | INTRAMUSCULAR | Status: DC
  Filled 2012-06-30 (×3): qty 1

## 2012-06-30 MED ORDER — POTASSIUM CHLORIDE 20 MEQ/15ML (10%) PO LIQD
40.0000 meq | Freq: Once | ORAL | Status: AC
  Administered 2012-06-30: 40 meq via ORAL
  Filled 2012-06-30: qty 30

## 2012-06-30 MED ORDER — HEPARIN SOD (PORCINE) IN D5W 100 UNIT/ML IV SOLN
INTRAVENOUS | Status: AC
  Administered 2012-06-30: 1100 [IU]/h via INTRAVENOUS
  Filled 2012-06-30: qty 250

## 2012-06-30 MED ORDER — MIDAZOLAM HCL 2 MG/2ML IJ SOLN
1.0000 mg | INTRAMUSCULAR | Status: DC | PRN
  Administered 2012-06-30: 1 mg via INTRAVENOUS
  Administered 2012-07-01: 2 mg via INTRAVENOUS
  Administered 2012-07-01: 1 mg via INTRAVENOUS
  Filled 2012-06-30 (×3): qty 2

## 2012-06-30 MED ORDER — NOREPINEPHRINE BITARTRATE 1 MG/ML IJ SOLN
2.0000 ug/min | INTRAVENOUS | Status: DC
  Administered 2012-06-30: 5 ug/min via INTRAVENOUS
  Filled 2012-06-30: qty 4

## 2012-06-30 MED ORDER — POTASSIUM CHLORIDE 20 MEQ/15ML (10%) PO LIQD
ORAL | Status: AC
  Filled 2012-06-30: qty 30

## 2012-06-30 MED ORDER — SODIUM CHLORIDE 0.9 % IV SOLN: Freq: Once | INTRAVENOUS | Status: DC

## 2012-06-30 MED ORDER — CALCIUM CHLORIDE 10 % IV SOLN
INTRAVENOUS | Status: AC | PRN
  Administered 2012-06-30: 1 g via INTRAVENOUS

## 2012-06-30 MED ORDER — HEPARIN (PORCINE) IN NACL 100-0.45 UNIT/ML-% IJ SOLN
1450.0000 [IU]/h | INTRAMUSCULAR | Status: DC
  Administered 2012-07-01: 1350 [IU]/h via INTRAVENOUS
  Administered 2012-07-02: 1450 [IU]/h via INTRAVENOUS
  Filled 2012-06-30 (×4): qty 250

## 2012-06-30 MED ORDER — PANTOPRAZOLE SODIUM 40 MG IV SOLR
40.0000 mg | Freq: Every day | INTRAVENOUS | Status: DC
  Administered 2012-06-30 – 2012-07-01 (×2): 40 mg via INTRAVENOUS
  Filled 2012-06-30 (×3): qty 40

## 2012-06-30 MED ORDER — ATORVASTATIN CALCIUM 80 MG PO TABS
80.0000 mg | ORAL_TABLET | Freq: Every day | ORAL | Status: DC
  Administered 2012-06-30 – 2012-07-05 (×5): 80 mg
  Filled 2012-06-30 (×7): qty 1

## 2012-06-30 MED ORDER — SUCCINYLCHOLINE CHLORIDE 20 MG/ML IJ SOLN
INTRAMUSCULAR | Status: AC
  Filled 2012-06-30: qty 1

## 2012-06-30 MED ORDER — BIOTENE DRY MOUTH MT LIQD
15.0000 mL | Freq: Four times a day (QID) | OROMUCOSAL | Status: DC
  Administered 2012-07-01 – 2012-07-06 (×23): 15 mL via OROMUCOSAL

## 2012-06-30 MED ORDER — LIDOCAINE HCL (CARDIAC) 20 MG/ML IV SOLN
INTRAVENOUS | Status: AC
  Filled 2012-06-30: qty 5

## 2012-06-30 MED ORDER — POTASSIUM CHLORIDE 10 MEQ/50ML IV SOLN
10.0000 meq | INTRAVENOUS | Status: AC
  Administered 2012-06-30 (×2): 10 meq via INTRAVENOUS
  Filled 2012-06-30: qty 100

## 2012-06-30 MED ORDER — SODIUM CHLORIDE 0.9 % IV SOLN
INTRAVENOUS | Status: AC | PRN
  Administered 2012-06-30: 999 mL/h via INTRAVENOUS

## 2012-06-30 MED ORDER — NOREPINEPHRINE BITARTRATE 1 MG/ML IJ SOLN: 2.0000 ug/min | INTRAVENOUS | Status: DC

## 2012-06-30 MED ORDER — SODIUM CHLORIDE 0.9 % IV SOLN
INTRAVENOUS | Status: DC
  Administered 2012-06-30: 15:00:00 via INTRAVENOUS

## 2012-06-30 MED ORDER — ETOMIDATE 2 MG/ML IV SOLN
INTRAVENOUS | Status: AC
  Administered 2012-06-30: 20 mg via INTRAVENOUS
  Filled 2012-06-30: qty 20

## 2012-06-30 MED ORDER — SODIUM CHLORIDE 0.9 % IV SOLN
INTRAVENOUS | Status: DC
  Administered 2012-06-30: 10 mL/h via INTRAVENOUS
  Administered 2012-07-01: 15:00:00 via INTRAVENOUS

## 2012-06-30 MED ORDER — ASPIRIN 300 MG RE SUPP
300.0000 mg | Freq: Every day | RECTAL | Status: DC
  Filled 2012-06-30: qty 1

## 2012-06-30 MED ORDER — ROCURONIUM BROMIDE 50 MG/5ML IV SOLN
INTRAVENOUS | Status: AC
  Administered 2012-06-30: 100 mg
  Filled 2012-06-30: qty 2

## 2012-06-30 MED ORDER — CHLORHEXIDINE GLUCONATE 0.12 % MT SOLN
15.0000 mL | Freq: Two times a day (BID) | OROMUCOSAL | Status: DC
  Administered 2012-06-30 – 2012-07-01 (×3): 15 mL via OROMUCOSAL
  Filled 2012-06-30 (×3): qty 15

## 2012-06-30 MED ORDER — FENTANYL CITRATE 0.05 MG/ML IJ SOLN
25.0000 ug | INTRAMUSCULAR | Status: DC | PRN
  Administered 2012-06-30 – 2012-07-01 (×2): 25 ug via INTRAVENOUS
  Administered 2012-07-01: 50 ug via INTRAVENOUS
  Filled 2012-06-30 (×4): qty 2

## 2012-06-30 MED ORDER — SODIUM CHLORIDE 0.9 % IV SOLN
1.5000 g | Freq: Four times a day (QID) | INTRAVENOUS | Status: DC
  Administered 2012-06-30 – 2012-07-02 (×8): 1.5 g via INTRAVENOUS
  Filled 2012-06-30 (×12): qty 1.5

## 2012-06-30 MED FILL — Medication: Qty: 1 | Status: AC

## 2012-06-30 NOTE — ED Notes (Signed)
Family has been placed in family room, care management helping with communication.

## 2012-06-30 NOTE — ED Notes (Signed)
Pt brought in originally for stemi and then encode was LSN last nite at 9pm and told wife he was going to lay in the floor and was found unresponsive and cold.  Pt was given Narcan 1mg  and no response.  Pupils not reactive.  Came in on NRB with IO and pt not responding.  EKG done and cathlab at bedside.  Planning for intubation

## 2012-06-30 NOTE — Progress Notes (Signed)
Telephoned Dr. Sung Amabile to report K of 2.8. New orders were received.

## 2012-06-30 NOTE — ED Notes (Signed)
Transporting pt to CT now with RNS, MD and RT

## 2012-06-30 NOTE — Progress Notes (Signed)
BP is 62/38 .Marland KitchenMarland KitchenMAP 44. Telephoned Dr. Sung Amabile.  He states that he will have Brett Canales Minor NP to come to the bedside to assess.

## 2012-06-30 NOTE — ED Notes (Addendum)
Pt remains sedated on ventilator at the time. Vital signs stable. Consulting cardiac cath lab for possible procedure.

## 2012-06-30 NOTE — Progress Notes (Addendum)
ANTICOAGULATION CONSULT NOTE - Initial Consult  Pharmacy Consult for Heparin Indication: NSTEMI  Patient Measurements: Height: 6' (182.9 cm) Weight: 211 lb 3.2 oz (95.8 kg) IBW/kg (Calculated) : 77.6 Heparin Dosing Weight: 95.8 kg   Vital Signs: Temp: 92.8 F (33.8 C) (02/25 1600) Temp src: Core (Comment) (02/25 1600) BP: 65/51 mmHg (02/25 1500) Pulse Rate: 81 (02/25 1600)  Labs:  Recent Labs  06/30/12 0841 06/30/12 0842 06/30/12 1103 06/30/12 1400  HGB 12.3*  --   --   --   HCT 37.3*  --   --   --   PLT 230  --   --   --   APTT 30  --   --   --   LABPROT 16.8*  --   --   --   INR 1.40  --   --   --   CREATININE 2.39*  --  2.09*  --   TROPONINI  --  0.31*  --  1.72*    Estimated Creatinine Clearance: 40.6 ml/min (by C-G formula based on Cr of 2.09).  Assessment: 67 yo male s/p V-fib arrest / self-induced hypothermia (not hypothermia protocol) being rewarmed & started on IV heparin for NSTEMI. Hgb 12.3, Plts 230, no bleeding noted in chart.  Goal of Therapy:  Heparin level 0.3-0.7 units/ml Monitor platelets by anticoagulation protocol: Yes   Plan:  1) Heparin IV 2850 unit bolus x 1, followed by 1100 units/hr 2) Follow-up 6 hour heparin level 3) Follow-up daily CBC & heparin level 4) Monitor signs/symptoms of bleeding and rewarming   Benjaman Pott, PharmD, BCPS 06/30/2012   5:54 PM

## 2012-06-30 NOTE — ED Notes (Signed)
Given to Dr. Oletta Lamas posterior ECG, time done 10:49

## 2012-06-30 NOTE — ED Notes (Signed)
Nurse unable to take report at the time. To call back.  

## 2012-06-30 NOTE — Progress Notes (Signed)
Responded to ED page to provide emotional and spiritual support to Pt. Wife and other family who were already in consultation room B.  Pt. came into the ED unconscious after being found by his wife. Wife currently have support of a cousin with here at bedside. Other family members of wife and Pt. Are expected to arrive later. All coming from other cities.  Pt.  going to Cath Lab and then to ICU.  Will follow as needed.  Wife very tearful and afraid.  06/30/12 1100  Clinical Encounter Type  Visited With Patient;Family;Health care provider  Visit Type Critical Care;ED;Trauma  Referral From Nurse  Spiritual Encounters  Spiritual Needs Prayer;Emotional  Stress Factors  Patient Stress Factors Exhausted;Family relationships;Major life changes   Pager # [phone]

## 2012-06-30 NOTE — Progress Notes (Signed)
ANTIBIOTIC CONSULT NOTE - INITIAL  Pharmacy Consult:  Unasyn  Indication:  Possible aspiration  Allergies  Allergen Reactions  . Haloperidol Lactate     REACTION: Paranoid and withdrawal  . Pramipexole Dihydrochloride     REACTION: unknown reaction  . Pregabalin     REACTION: unknown reaction    Patient Measurements: Height: 6' (182.9 cm) Weight: 214 lb 8.1 oz (97.3 kg) IBW/kg (Calculated) : 77.6  Vital Signs: Temp src: Core (Comment) (02/25 1042) BP: 134/71 mmHg (02/25 1042) Pulse Rate: 82 (02/25 1033) Intake/Output from this shift: Total I/O In: 1500 [I.V.:1500] Out: -   Labs:  Recent Labs  06/30/12 0841  WBC 9.8  HGB 12.3*  PLT 230  CREATININE 2.39*   Estimated Creatinine Clearance: 35.8 ml/min (by C-G formula based on Cr of 2.39). No results found for this basename: VANCOTROUGH, VANCOPEAK, VANCORANDOM, GENTTROUGH, GENTPEAK, GENTRANDOM, TOBRATROUGH, TOBRAPEAK, TOBRARND, AMIKACINPEAK, AMIKACINTROU, AMIKACIN,  in the last 72 hours   Microbiology: No results found for this or any previous visit (from the past 720 hour(s)).  Medical History: Past Medical History  Diagnosis Date  . Parkinson disease   . Bipolar 1 disorder   . HTN (hypertension)   . Hyperlipemia   . Stroke   . COPD (chronic obstructive pulmonary disease)   . Arthritis   . Anxiety       Assessment: 43 YOM s/p shocking for V-fib arrest to start Unasyn for possible aspiration.  Patient with renal dysfunction.    Plan:  - Unasyn 1.5gm IV Q6H - Monitor renal fxn and adjust dosage as appropriate    Carsyn Boster D. Laney Potash, PharmD, BCPS Pager:  [phone] 06/30/2012, 10:52 AM

## 2012-06-30 NOTE — H&P (Signed)
PULMONARY  / CRITICAL CARE MEDICINE  Name: Jeremy Carr MRN: [id number] DOB: Oct 25, 1945    ADMISSION DATE:  06/30/2012 CONSULTATION DATE: 06/30/12  REFERRING MD : EDP  CHIEF COMPLAINT:AMS/ V-Fib Arrest   BRIEF PATIENT DESCRIPTION:  Unresponsive, intubated, hypothermic 67 year old male post v-fib arrest with bradycardia and multi-factorial co-morbidities.  SIGNIFICANT EVENTS / STUDIES:  2/25: Found down at home  2/25: Intubated in ED  LINES / TUBES: 2/25 RIJ cvc>>>  2/25 ETT>>>  CULTURES: 2/25: Urine >>> 2/25: Blood>>>  2/25 sputum>>  ANTIBIOTICS: 2/25 Unasyn per pharmacy >>>  HISTORY OF PRESENT ILLNESS:  67 year old male with history of Parkinson's and Bipolar Disease with 3 day history of increased sleepiness, decreased appetite and psychosis. Slid down wall at home evening of 2/24, and after 4 hours of attempting to get patient up, he slept covered with blankets on the floor. Found unresponsive by wife this am with deep slow respirations. EMS transported to [facility] ED where patient was intubated and lined. Pt with GCS 3 and hypothermic upon arrival( 80 F) with v-fib arrest on 3 separate occasions that responded to defib.and ACLS drug treatment. PCCM asked to assume care.  PAST MEDICAL HISTORY :  Past Medical History  Diagnosis Date  . Parkinson disease   . Bipolar 1 disorder   . HTN (hypertension)   . Hyperlipemia   . Stroke   . COPD (chronic obstructive pulmonary disease)   . Arthritis   . Anxiety    Past Surgical History  Procedure Laterality Date  . Colonoscopy  05/15/2006    Normal rectum/Long tortuous colon/Normal terminal ileum/ Polyps in the left and right colon resected as described above  . Esophagogastroduodenoscopy  02/05/2006    Normal stomach, normal D-I and D-II/ Four-quadrant distal esophageal erosions, consistent with moderately severe erosive reflux esophagitis, otherwise normal tubular esophagus  . Esophagogastroduodenoscopy  09/06/2009     normal esophagus/small hiatal hernia  . Colonoscopy  09/06/2009    normal rectum  . Esophageal dilation      ready for 3rd time   Prior to Admission medications   Medication Sig Start Date End Date Taking? Authorizing Provider  amLODipine (NORVASC) 10 MG tablet Take 10 mg by mouth daily.   Yes Historical Provider, MD  aspirin EC 81 MG tablet Take 81 mg by mouth daily.   Yes Historical Provider, MD  baclofen (LIORESAL) 10 MG tablet Take 10 mg by mouth 4 (four) times daily.  05/10/11  Yes Historical Provider, MD  benztropine (COGENTIN) 1 MG tablet Take 1 mg by mouth 3 (three) times daily. 05/21/12  Yes Mike Craze, MD  carbidopa-levodopa (SINEMET) 25-100 MG per tablet Take 1 tablet by mouth See admin instructions. Take 1 tablet at 7am, 1 tablet at 11am, 1 tablet at noon, 1 tablet at 3pm, 1 tablet at 5pm, 1 tablet at bedtime. 07/16/11  Yes Historical Provider, MD  clonazePAM (KLONOPIN) 1 MG tablet Take 0.5-1 mg by mouth 3 (three) times daily. Take 1/2 tablet by mouth at 7am, 1/2 tablet in the afternoon, and 1/2 tablet at bedtime. 07/03/11  Yes Historical Provider, MD  ibuprofen (ADVIL,MOTRIN) 400 MG tablet Take 400 mg by mouth every 8 (eight) hours as needed for pain.  10/25/11  Yes Historical Provider, MD  lamoTRIgine (LAMICTAL) 150 MG tablet Take 150 mg by mouth daily. 11 am 05/21/12  Yes Mike Craze, MD  levothyroxine (SYNTHROID, LEVOTHROID) 100 MCG tablet Take 100 mcg by mouth daily.   Yes Historical Provider,  MD  lubiprostone (AMITIZA) 24 MCG capsule Take 24 mcg by mouth 2 (two) times daily.   Yes Historical Provider, MD  omeprazole (PRILOSEC) 20 MG capsule Take 20 mg by mouth daily.  07/22/11  Yes Historical Provider, MD  polyethylene glycol (MIRALAX / GLYCOLAX) packet Take 17 g by mouth daily.   Yes Historical Provider, MD  pravastatin (PRAVACHOL) 20 MG tablet Take 20 mg by mouth at bedtime.  07/22/11  Yes Historical Provider, MD  QUEtiapine (SEROQUEL XR) 200 MG 24 hr tablet Take 200 mg by mouth  2 (two) times daily. 1 tablet at noon, and 1 tablet at bedtime.   Yes Historical Provider, MD  QUEtiapine (SEROQUEL XR) 300 MG 24 hr tablet Take 300 mg by mouth at bedtime. Take with one 200 mg tablet.   Yes Historical Provider, MD  sertraline (ZOLOFT) 100 MG tablet Take 200 mg by mouth at bedtime. 05/21/12  Yes Mike Craze, MD   Allergies  Allergen Reactions  . Haloperidol Lactate     REACTION: Paranoid and withdrawal  . Pramipexole Dihydrochloride     REACTION: unknown reaction  . Pregabalin     REACTION: unknown reaction    FAMILY HISTORY:  Family History  Problem Relation Age of Onset  . Bipolar disorder Father   . Anxiety disorder Mother   . OCD Mother   . Dementia Mother   . Alcohol abuse Sister   . Anxiety disorder Sister   . Dementia Sister   . ADD / ADHD Neg Hx   . Drug abuse Neg Hx   . Depression Neg Hx   . Paranoid behavior Neg Hx   . Schizophrenia Neg Hx   . Seizures Neg Hx   . Sexual abuse Neg Hx   . Physical abuse Neg Hx    SOCIAL HISTORY:  reports that he has quit smoking. He does not have any smokeless tobacco history on file. He reports that he does not drink alcohol or use illicit drugs.  REVIEW OF SYSTEMS:  Unable to assess  SUBJECTIVE:  Unresponsive, intubated, hypothermic 67 year old male post v-fib arrest with bradycardia and multi-factorial co-morbidities. VITAL SIGNS: Pulse Rate:  [49-90] 82 (02/25 1033) Resp:  [18-24] 18 (02/25 1033) BP: (66-134)/(29-87) 134/71 mmHg (02/25 1033) SpO2:  [94 %-100 %] 94 % (02/25 1033) FiO2 (%):  [100 %] 100 % (02/25 1033) HEMODYNAMICS:   VENTILATOR SETTINGS: Vent Mode:  [-] PRVC FiO2 (%):  [100 %] 100 % Set Rate:  [18 bmp-24 bmp] 24 bmp Vt Set:  [620 mL] 620 mL PEEP:  [5 cmH20] 5 cmH20 INTAKE / OUTPUT: Intake/Output     02/24 0701 - 02/25 0700 02/25 0701 - 02/26 0700   I.V.  1500   Total Intake   1500   Net   +1500          PHYSICAL EXAMINATION: General:   Unresponsive, intubated white  male Neuro:   Unresponsive, pupils 3 and sluggish HEENT: No LAN   Cardiovascular: Bradycardia with abnormal EKG   Lungs:   Coarse throughout Abdomen:   Soft and flat with diminished bowel sounds Musculoskeletal:  Flacid at present ? 2/2 intubation medications Skin:   Fine petechial rash to lower extremities  LABS:  Recent Labs Lab 06/30/12 0841 06/30/12 0842  HGB 12.3*  --   WBC 9.8  --   PLT 230  --   NA 140  --   K 3.6  --   CL 103  --   CO2  17*  --   GLUCOSE 199*  --   BUN 39*  --   CREATININE 2.39*  --   CALCIUM 8.7  --   AST 302*  --   ALT 22  --   ALKPHOS 143*  --   BILITOT 0.3  --   PROT 7.2  --   ALBUMIN 3.2*  --   APTT 30  --   INR 1.40  --   LATICACIDVEN  --  4.32*  TROPONINI  --  0.31*  PROBNP  --  235.7*   No results found for this basename: GLUCAP,  in the last 168 hours  Imaging: Ct Head Wo Contrast  06/30/2012  *RADIOLOGY REPORT*  Clinical Data: Unresponsive  CT HEAD WITHOUT CONTRAST  Technique:  Contiguous axial images were obtained from the base of the skull through the vertex without contrast.  Comparison: Brain MRI 08/06/2010, head CT 08/03/2010  Findings: The patient is tilted in the scanner. No acute intracranial hemorrhage.  No focal mass lesion.  No CT evidence of acute infarction.   No midline shift or mass effect.  No hydrocephalus.  Basilar cisterns are patent.  There is generalized cortical atrophy similar to prior.  There is mild periventricular and subcortical white matter hypodensities also similar to prior. Small 5 mm hypodensity in the left basal ganglia (image 17) is  not clearly seen on prior.  Paranasal sinuses and mastoid air cells are clear.  Orbits are normal.  IMPRESSION:  1.  No acute intracranial findings. 2.  Atrophy and microvascular disease similar to prior. 3.  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   Original Report Authenticated By: Genevive Bi, M.D.    Dg Chest Port 1 View  06/30/2012   *RADIOLOGY REPORT*  Clinical Data: Chest pain  PORTABLE CHEST - 1 VIEW  Comparison: 08/03/2010  Findings: The endotracheal tube tip is above the carina.  There is a right IJ catheter with the tip in the right atrium.  Heart size is mildly enlarged.  There are bilateral pleural effusions and mild interstitial edema consistent with CHF.  Atelectasis noted within both lung bases.  IMPRESSION:  1.  Satisfactory position of ET tube with tip above the carina. 2.  The right IJ catheter tip is in the right atrium.  No pneumothorax identified. 3.  Pleural effusions and pulmonary edema consistent with CHF.   Original Report Authenticated By: Signa Kell, M.D.       ASSESSMENT / PLAN:  PULMONARY A:VDRF  In setting of AMS and cardiac arrest Pulmonary edema  COPD   P:  Vent support Monitor ABG's post ETT , goal to 40%, peep 5 , 8 cc/kg Follow CXR 2/2 concern for aspiration event/Pulmonary edema.  CARDIOVASCULAR A: Shock Post V-Fib arrest R/O MI when hypothermia resolved ST changes mimicking MI in setting of hypothermia CHF P:  Volume replete especially as rewarming and vasodilation noted Serial Troponins when temp is 87 F. Repeat EKG when Temp is 28 F Trend BNP's Cardiology Consulted May need more volume / levophed as rewarms Dopamine to consider if brady and drops BP Frequent lytes  RENAL   Recent Labs Lab 06/30/12 0841  CREATININE 2.39*     A:  ARF R/O Rhabdo P:   Serial Creatinine Gentle volume repletion Follow Q8 BMETS If not resolving 2/26 am considor renal U/S, consult  GASTROINTESTINAL A: NPO P: If remains intubated in the 2-26 consider tube feedings and after temp rises  HEMATOLOGIC A: Anemia  P:  Monitor CBC  scd  INFECTIOUS A: Afebrile R/O Aspiration event P:   UNasyn bc sputum  ENDOCRINE A: Hypothyroidism P: Check TSH Continue synthroid Monitor CBG's  If drops BP, add stress roids and cortisol  NEUROLOGIC A:  AMS unknown etiology(unknown down  time) Parkinsons Disease BiPolar Disease P:   CT Head ( No acute issue) Place OG tube and continue Parkinsonian medications if able to give oral Neuro Consult if not improving  No haldol etc Correct temp  TODAY'S SUMMARY: HISTORY OF PRESENT ILLNESS:  67 year old male with history of Parkinson's and Bipolar Disease with 3 day history of increased sleepiness, decreased appetite and psychosis. Slid down wall at home evening of 2/24, and after 4 hours of attempting to get patient up, he slept covered with blankets on the floor. Found unresponsive by wife this am with deep slow respirations. EMS transported to [facility] ED where patient was intubated and lined. Pt with GCS 3 and hypothermic upon arrival( 80 F) with v-fib arrest on 3 separate occasions (post intubation)that responded to defib and ACLS drug treatment. Admitted to ICU with cardiology following cardiac status as hypothermia resolves.   Kandice Robinsons, RN ACNP Student [employer] for Devra Dopp, ACNP  Brett Canales Minor ACNP Adolph Pollack PCCM Pager [phone] till 3 pm If no answer page [phone] 06/30/2012, 11:45 AM      I have personally obtained a history, examined the patient, evaluated laboratory and imaging results, formulated the assessment and plan and placed orders. CRITICAL CARE: The patient is critically ill with multiple organ systems failure and requires high complexity decision making for assessment and support, frequent evaluation and titration of therapies, application of advanced monitoring technologies and extensive interpretation of multiple databases. Critical Care Time devoted to patient care services described in this note is 35 minutes.    Mcarthur Rossetti. Tyson Alias, MD, FACP Pgr: [phone] [facility]  Mcarthur Rossetti. Tyson Alias, MD, FACP Pgr: [phone]  Pulmonary & Critical Care

## 2012-06-30 NOTE — ED Notes (Signed)
Resident planning to place femoral line.  Cathlab states to call Dr. Patty Sermons after the CT scan of head and he would make determination if patient is going to cathlab

## 2012-06-30 NOTE — Consult Note (Signed)
CARDIOLOGY CONSULT NOTE  Patient ID: EVANS LEVEE MRN: [id number], DOB/AGE: August 15, 1945   Admit date: 06/30/2012 Date of Consult: 06/30/2012   Primary Physician: Dwana Melena, MD Primary Cardiologist: Dr. Dietrich Pates  HPI: Patient is a 67 yo M with history of HTN, HLD, ?TIA, Bipolar disorder and parkinson's disease who was found unresponsive by his wife at around 7am on the morning of admission. He was last seen in his usual state at 10pm on the evening prior to admission. Apparently, around 5pm yesterday, he went to the bathroom, and his legs became rigid, not uncommon for him given his parkinson's disease. He and his wife tried to get him into his wheelchair for about 4.5 hours, and around 9:30pm, decided that he would sleep on the floor after he took his 10pm meds. Patient's wife notes that the patient has not had any new complaints, including new dizziness (h/o orthostatic hypotension), chest pain or shortness of breath. He did complain of some mild right sided abdominal pain that he was going to have evaluated today at the neurologist's office, but no diarrhea/constipation/nausea/vomiting.  Of note, on Saturday, he did have some hallucinations (thinking his wife is hitting/choking him, thinking the neighbors painted their cabinets green), but that resolved by Sunday morning, and this is also not uncommon for him given his Bipolar disease.  Regarding recent medication changes, about 2 weeks ago, his klonopin dose was increase from 0.5mg  BID to 0.5mg  QID by his PCP.  He has not had any adverse effects of this medication increase.   In the ED, he did have 3 episodes of V.fib arrest responsive to defibrillation.  He also received sodium bicarb, calcium and epinephrine.  Problem List  Past Medical History  Diagnosis Date  . Parkinson disease   . Bipolar 1 disorder   . HTN (hypertension)   . Hyperlipemia   . Stroke   . COPD (chronic obstructive pulmonary disease)   . Arthritis   . Anxiety       Past Surgical History  Procedure Laterality Date  . Colonoscopy  05/15/2006    Normal rectum/Long tortuous colon/Normal terminal ileum/ Polyps in the left and right colon resected as described above  . Esophagogastroduodenoscopy  02/05/2006    Normal stomach, normal D-I and D-II/ Four-quadrant distal esophageal erosions, consistent with moderately severe erosive reflux esophagitis, otherwise normal tubular esophagus  . Esophagogastroduodenoscopy  09/06/2009    normal esophagus/small hiatal hernia  . Colonoscopy  09/06/2009    normal rectum  . Esophageal dilation      ready for 3rd time     Allergies  Allergies  Allergen Reactions  . Haloperidol Lactate     REACTION: Paranoid and withdrawal  . Pramipexole Dihydrochloride     REACTION: unknown reaction  . Pregabalin     REACTION: unknown reaction    Inpatient Medications  . lidocaine (cardiac) 100 mg/24ml      . succinylcholine        Family History Family History  Problem Relation Age of Onset  . Bipolar disorder Father   . Anxiety disorder Mother   . OCD Mother   . Dementia Mother   . Alcohol abuse Sister   . Anxiety disorder Sister   . Dementia Sister   . ADD / ADHD Neg Hx   . Drug abuse Neg Hx   . Depression Neg Hx   . Paranoid behavior Neg Hx   . Schizophrenia Neg Hx   . Seizures Neg Hx   . Sexual abuse Neg  Hx   . Physical abuse Neg Hx      Social History History   Social History  . Marital Status: Married    Spouse Name: N/A    Number of Children: N/A  . Years of Education: N/A   Occupational History  . Not on file.   Social History Main Topics  . Smoking status: Former Games developer  . Smokeless tobacco: Not on file  . Alcohol Use: No  . Drug Use: No  . Sexually Active: Not on file   Other Topics Concern  . Not on file   Social History Narrative  . No narrative on file     Review of Systems - unable to obtain  Physical Exam  Blood pressure 134/71, pulse 82, temperature 0 F (-17.8 C),  resp. rate 21, height 6' (1.829 m), weight 214 lb 8.1 oz (97.3 kg), SpO2 93.00%.  Neuro: unarousable, sluggish pupillary reflex, no corneal reflex HEENT: Intubated  Neck: Supple without bruits or JVD. Lungs:  Coarse b/l breath sounds Heart: RRR no s3, s4, or murmurs. Abdomen: Soft, non-tender, non-distended, BS + x 4.  Extremities: Cold to touch, No clubbing, cyanosis or edema. DP/PT/Radials 1+ and equal bilaterally.  Labs   Recent Labs  06/30/12 0842  TROPONINI 0.31*   Lab Results  Component Value Date   WBC 9.8 06/30/2012   HGB 12.3* 06/30/2012   HCT 37.3* 06/30/2012   MCV 91.4 06/30/2012   PLT 230 06/30/2012    Recent Labs Lab 06/30/12 0841  NA 140  K 3.6  CL 103  CO2 17*  BUN 39*  CREATININE 2.39*  CALCIUM 8.7  PROT 7.2  BILITOT 0.3  ALKPHOS 143*  ALT 22  AST 302*  GLUCOSE 199*   Lab Results  Component Value Date   CHOL 158 10/14/2008   HDL 51 10/14/2008   LDLCALC 82 10/14/2008   TRIG 123 10/14/2008   No results found for this basename: DDIMER    Radiology/Studies  Ct Head Wo Contrast 06/30/2012   IMPRESSION:  1.  No acute intracranial findings. 2.  Atrophy and microvascular disease similar to prior. 3.  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     Dg Chest Port 1 View 06/30/2012 IMPRESSION:  1.  Satisfactory position of ET tube with tip above the carina. 2.  The right IJ catheter tip is in the right atrium.  No pneumothorax identified. 3.  Pleural effusions and pulmonary edema consistent with CHF.    ECG - osborne waves in II, III, aVF, and V3-6, irregular   Echo (12/16/2005) : LVEF 65% without wall motion abnormalties & normal valves  NM Myoview (10/01/2006): unremarkable adenosine stress myoview study revealing no significant stress induced EKG abnormalities    ASSESSMENT AND PLAN 67 yo M with history of HTN, HLD, ?TIA, Bipolar disorder and parkinson's disease who was found unresponsive by his wife at around 7am on the morning of  admission (06/30/12).  #Acute respiratory failure: intubated with vent mgmt per PCCM; Unasyn started for ?Aspiration PNA  #Hypothermia: due to poor perfusion, monitor temp with warming blanket and improved perfusion  #VF Cardiac Arrest: Responsive to defibrillation; unclear etiology, but troponin not significantly elevated, and less likely acute coronary event.  EKG suggests hypothermia osborne waves. -Cycle CEs (q6h x 2) and EKGs (q3h x 3 then daily) -follow TSH -ASA -statin per tube -hold bb in setting of sinus bradycardia (due to hypothermia) -Echo (pBNP only mildly elevated - 236)  #Atrial fibrillation: No  history of prior afib, on repeat EKG, afib corrected  #Acute renal failure: Cr = 2.39 at admission, and was 1.11 one year prior.  Likely prerenal in setting of cardiac arrest.  UA suggestive of UTI, culture pending. Micro reveals granular casts. Rhabdomyolysis may be contributing. Check CK.  Monitor with improved perfusion.  #HTN: Home regimen includes norvasc 10mg  daily; hold for now and monitor  #HLD: Home regimen includes pravastatin 20mg  daily  #Hypothyroidism: follow TSH, continue home dose synthroid  #Bipolar disorder  #Parkinson's disease  Signed, Stacy Gardner, MD, PGY2 06/30/2012, 10:14 AM Agree with assessment and plan as noted above.  The patient does not have any history of known coronary artery disease.  His wife stated that the patient had a stress test several years ago by Dr. Dietrich Pates which was normal.  He has not been complaining of any chest discomfort or shortness of breath recently.  He has been having more problems recently with his Parkinson's disease and with his legs locking up on him and he was to have had a followup appointment with Dr. Sandria Manly in the very near future. The patient was seen in the emergency room.  He is unresponsive on the vent.  His body temperature on arrival was extremely low and his electrocardiogram shows widespread Osborne waves in the  limb leads as well as the precordial leads.  His initial troponin was borderline at 0.31.  We will plan to cycle enzymes and get serial EKGs.  At this point there is no evidence of an acute myocardial infarction.  His ventricular fibrillation and cardiac arrest are most likely related to his profound hypothermia.  He has significant renal insufficiency of unknown duration and is not presently a candidate for cardiac catheterization.  Will follow with you

## 2012-06-30 NOTE — ED Notes (Signed)
PT remains with pulses and plan is to go to CT and possibly to proceed to cathlab

## 2012-06-30 NOTE — ED Provider Notes (Signed)
History     CSN: [id number]  Arrival date & time 06/30/12  0808   First MD Initiated Contact with Patient 06/30/12 [phone]      Chief Complaint  Patient presents with  . Unresponsive     (Consider location/radiation/quality/duration/timing/severity/associated sxs/prior treatment) Patient is a 67 y.o. male presenting with altered mental status. The history is provided by the EMS personnel.  Altered Mental Status This is a new problem. The current episode started today. The problem occurs constantly. The problem has been unchanged. Associated symptoms comments: Unknown . Exacerbated by: Unknown. Treatments tried: Unknown. The treatment provided no relief.    Past Medical History  Diagnosis Date  . Parkinson disease   . Bipolar 1 disorder   . HTN (hypertension)   . Hyperlipemia   . Stroke   . COPD (chronic obstructive pulmonary disease)   . Arthritis   . Anxiety     Past Surgical History  Procedure Laterality Date  . Colonoscopy  05/15/2006    Normal rectum/Long tortuous colon/Normal terminal ileum/ Polyps in the left and right colon resected as described above  . Esophagogastroduodenoscopy  02/05/2006    Normal stomach, normal D-I and D-II/ Four-quadrant distal esophageal erosions, consistent with moderately severe erosive reflux esophagitis, otherwise normal tubular esophagus  . Esophagogastroduodenoscopy  09/06/2009    normal esophagus/small hiatal hernia  . Colonoscopy  09/06/2009    normal rectum  . Esophageal dilation      ready for 3rd time    Family History  Problem Relation Age of Onset  . Bipolar disorder Father   . Anxiety disorder Mother   . OCD Mother   . Dementia Mother   . Alcohol abuse Sister   . Anxiety disorder Sister   . Dementia Sister   . ADD / ADHD Neg Hx   . Drug abuse Neg Hx   . Depression Neg Hx   . Paranoid behavior Neg Hx   . Schizophrenia Neg Hx   . Seizures Neg Hx   . Sexual abuse Neg Hx   . Physical abuse Neg Hx     History   Substance Use Topics  . Smoking status: Former Games developer  . Smokeless tobacco: Not on file  . Alcohol Use: No      Review of Systems  Unable to perform ROS: Patient unresponsive  Constitutional: Negative for activity change.  Psychiatric/Behavioral: Positive for altered mental status.    Allergies  Haloperidol lactate; Pramipexole dihydrochloride; and Pregabalin  Home Medications  No current outpatient prescriptions on file.  BP 65/51  Pulse 81  Temp(Src) 92.8 F (33.8 C) (Core (Comment))  Resp 19  Ht 6' (1.829 m)  Wt 211 lb 3.2 oz (95.8 kg)  BMI 28.64 kg/m2  SpO2 95%  Physical Exam  Constitutional: He appears well-developed and well-nourished. He appears distressed.  cold  HENT:  Head: Normocephalic and atraumatic.  Mouth/Throat: No oropharyngeal exudate.  Dry mucus membranes  Eyes:  1mm sluggish, equal  Neck: Normal range of motion. Neck supple.  Cardiovascular: Regular rhythm and normal heart sounds.  Bradycardia present.  Exam reveals no gallop and no friction rub.   No murmur heard. Pulmonary/Chest: Breath sounds normal. He is in respiratory distress (snonorous). He has no wheezes. He has no rales.  Abdominal: Soft. Bowel sounds are normal. He exhibits no distension and no mass. There is no tenderness. There is no rebound and no guarding.  Musculoskeletal: Normal range of motion. He exhibits no edema and no tenderness.  Neurological: He is  unresponsive. GCS eye subscore is 1. GCS verbal subscore is 1. GCS motor subscore is 1.  Skin: Skin is warm and dry.  Psychiatric: He has a normal mood and affect.    ED Course  INTUBATION Date/Time: 06/30/2012 8:20 AM Performed by: Toy Cookey Authorized by: Toy Cookey Consent: The procedure was performed in an emergent situation. Indications: airway protection Intubation method: direct Patient status: unconscious Sedatives: etomidate Paralytic: rocuronium Laryngoscope size: Miller 3 Tube size: 7.5 mm Tube  type: cuffed Number of attempts: 1 Cords visualized: yes Post-procedure assessment: chest rise Breath sounds: equal Cuff inflated: yes Tube secured with: ETT holder Chest x-ray interpreted by me. Chest x-ray findings: endotracheal tube in appropriate position  CENTRAL LINE Date/Time: 06/30/2012 9:00 AM Performed by: Toy Cookey Authorized by: Toy Cookey Consent: The procedure was performed in an emergent situation. Preparation: skin prepped with 2% chlorhexidine Skin prep agent dried: skin prep agent completely dried prior to procedure Sterile barriers: all five maximum sterile barriers used - cap, mask, sterile gown, sterile gloves, and large sterile sheet Hand hygiene: hand hygiene performed prior to central venous catheter insertion Location details: right subclavian Site selection rationale: possible cath at R groin Patient position: Trendelenburg Catheter type: single lumen Catheter size: 7 Fr Pre-procedure: landmarks identified Ultrasound guidance: yes Number of attempts: 2 Successful placement: yes Post-procedure: line sutured Assessment: blood return through all ports and placement verified by x-ray Patient tolerance: Patient tolerated the procedure well with no immediate complications.   (including critical care time)  Labs Reviewed  CBC WITH DIFFERENTIAL - Abnormal; Notable for the following:    RBC 4.08 (*)    Hemoglobin 12.3 (*)    HCT 37.3 (*)    Neutrophils Relative 84 (*)    Neutro Abs 8.1 (*)    Lymphocytes Relative 11 (*)    All other components within normal limits  COMPREHENSIVE METABOLIC PANEL - Abnormal; Notable for the following:    CO2 17 (*)    Glucose, Bld 199 (*)    BUN 39 (*)    Creatinine, Ser 2.39 (*)    Albumin 3.2 (*)    AST 302 (*)    Alkaline Phosphatase 143 (*)    GFR calc non Af Amer 26 (*)    GFR calc Af Amer 30 (*)    All other components within normal limits  TROPONIN I - Abnormal; Notable for the following:     Troponin I 0.31 (*)    All other components within normal limits  PRO B NATRIURETIC PEPTIDE - Abnormal; Notable for the following:    Pro B Natriuretic peptide (BNP) 235.7 (*)    All other components within normal limits  URINALYSIS, ROUTINE W REFLEX MICROSCOPIC - Abnormal; Notable for the following:    APPearance HAZY (*)    Hgb urine dipstick LARGE (*)    Bilirubin Urine SMALL (*)    Protein, ur 100 (*)    Leukocytes, UA TRACE (*)    All other components within normal limits  URINE MICROSCOPIC-ADD ON - Abnormal; Notable for the following:    Bacteria, UA FEW (*)    Casts GRANULAR CAST (*)    All other components within normal limits  PROTIME-INR - Abnormal; Notable for the following:    Prothrombin Time 16.8 (*)    All other components within normal limits  BASIC METABOLIC PANEL - Abnormal; Notable for the following:    Potassium 2.8 (*)    CO2 18 (*)    Glucose, Bld 143 (*)  BUN 37 (*)    Creatinine, Ser 2.09 (*)    GFR calc non Af Amer 31 (*)    GFR calc Af Amer 36 (*)    All other components within normal limits  PHOSPHORUS - Abnormal; Notable for the following:    Phosphorus 5.6 (*)    All other components within normal limits  TROPONIN I - Abnormal; Notable for the following:    Troponin I 1.72 (*)    All other components within normal limits  CG4 I-STAT (LACTIC ACID) - Abnormal; Notable for the following:    Lactic Acid, Venous 4.32 (*)    All other components within normal limits  POCT I-STAT 3, BLOOD GAS (G3P V) - Abnormal; Notable for the following:    pH, Ven 7.134 (*)    pCO2, Ven 57.8 (*)    pO2, Ven 129.0 (*)    Bicarbonate 19.4 (*)    Acid-base deficit 10.0 (*)    All other components within normal limits  POCT I-STAT 3, BLOOD GAS (G3+) - Abnormal; Notable for the following:    pH, Arterial 7.477 (*)    pCO2 arterial 21.0 (*)    pO2, Arterial 51.0 (*)    Bicarbonate 16.9 (*)    Acid-base deficit 8.0 (*)    All other components within normal limits   MRSA PCR SCREENING  URINE CULTURE  CULTURE, BLOOD (ROUTINE X 2)  CULTURE, BLOOD (ROUTINE X 2)  URINE CULTURE  CULTURE, RESPIRATORY (NON-EXPECTORATED)  APTT  TSH  LACTIC ACID, PLASMA  MAGNESIUM  CORTISOL  STREP PNEUMONIAE URINARY ANTIGEN  BASIC METABOLIC PANEL  LEGIONELLA ANTIGEN, URINE  BLOOD GAS, ARTERIAL  TROPONIN I  BASIC METABOLIC PANEL  CK ISOENZYMES  CBC  BASIC METABOLIC PANEL  BLOOD GAS, ARTERIAL  HEPARIN LEVEL (UNFRACTIONATED)  HEPARIN LEVEL (UNFRACTIONATED)   Ct Head Wo Contrast  06/30/2012  *RADIOLOGY REPORT*  Clinical Data: Unresponsive  CT HEAD WITHOUT CONTRAST  Technique:  Contiguous axial images were obtained from the base of the skull through the vertex without contrast.  Comparison: Brain MRI 08/06/2010, head CT 08/03/2010  Findings: The patient is tilted in the scanner. No acute intracranial hemorrhage.  No focal mass lesion.  No CT evidence of acute infarction.   No midline shift or mass effect.  No hydrocephalus.  Basilar cisterns are patent.  There is generalized cortical atrophy similar to prior.  There is mild periventricular and subcortical white matter hypodensities also similar to prior. Small 5 mm hypodensity in the left basal ganglia (image 17) is  not clearly seen on prior.  Paranasal sinuses and mastoid air cells are clear.  Orbits are normal.  IMPRESSION:  1.  No acute intracranial findings. 2.  Atrophy and microvascular disease similar to prior. 3.  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   Original Report Authenticated By: Genevive Bi, M.D.    Dg Chest Port 1 View  06/30/2012  *RADIOLOGY REPORT*  Clinical Data: Chest pain  PORTABLE CHEST - 1 VIEW  Comparison: 08/03/2010  Findings: The endotracheal tube tip is above the carina.  There is a right IJ catheter with the tip in the right atrium.  Heart size is mildly enlarged.  There are bilateral pleural effusions and mild interstitial edema consistent with CHF.  Atelectasis noted  within both lung bases.  IMPRESSION:  1.  Satisfactory position of ET tube with tip above the carina. 2.  The right IJ catheter tip is in the right atrium.  No pneumothorax identified.  3.  Pleural effusions and pulmonary edema consistent with CHF.   Original Report Authenticated By: Signa Kell, M.D.      1. Cardiac arrest   2. Hypothermia, initial encounter   3. Respiratory failure with hypoxia   4. Altered mental status   5. Renal failure   6. Cardiac enzymes elevated    No diagnosis found.  Date: 06/30/2012, 8:07AM  Rate: 69  Rhythm: atrial fibrillation  QRS Axis: normal  Intervals: QT prolonged  ST/T Wave abnormalities: likely osbourne waves, ST elevations II, III, aVF, ST depressions V3-6  Conduction Disutrbances:LBBB  Narrative Interpretation:  Old EKG Reviewed: changes noted, Prior is NSR, w/o ST changes  Temp:82  Date: 06/30/2012  Rate: 49  Rhythm: sinus bradycardia  QRS Axis: normal  Intervals: QT prolonged, osbourne waves  ST/T Wave abnormalities: ST elevations inferiorly and ST depressions laterally  Conduction Disutrbances:left bundle branch block  Narrative Interpretation:  Old EKG Reviewed: changes noted, no longer in a fib    MDM   Pt is a 67 y.o. male with pertinent PMHX of Parkinson's, HTN, BPD, CVA, COPD, anxiety, arthritis who presents with unresponsiveness. Pt has had inc sleeping and poor PO intake 3 days, was confused earlier this week. He layed on the floor last night, which is not unusual for him, but they were unable to get him off the floor after trying for 4 hours and so he was wrapped in blankets and spent the night on the floor. This morning he was unresponsive. GCS 3 upon arrival with spontaneous sonorous respirations, cool skin, sluggish pupillary response, hypothermic, bradycardic. Code STEMI called by EMS for ST elevations in inferior leads, which upon arrival are concerning for STEMI vs Osborne waves due to hypothermia.  During initial eval Pt  had two short episodes of v-fib separated by several mins that each resolved after less than 1 round of chest compressions, defibrillation, 1 amp calcium & bicarb.  After pt intubated for airway protection, CVC placed in R IJ given developing hypotension and likely need for IV pressors.  Pt had third short episode of vfib after placement that again responded to one defibrillation.  Critical care and cardiology arrived.  Decison made to r/o CVA or intracranial bleed before consideration for cath lab.   Warm IVF and bair hugger in place. Marland Kitchen  9:24 AM I called radiology for quick read of CT, there is no large area of ischemia, intracranial bleed. PH 7.13, pCO2 57.8.  LA 4.32, Cr 2.39.    After speaking again with cardiology, plan to warm pt to around 90 and repeat EKG to determine need for heart cath as ST changes may solely from profound hypothermia.  Initial troponin only slightly elevated.    Pt will be taken to ICU for continued care.    Labs and imaging considered in decision making, reviewed by myself. Imaging interpreted by radiology. Pt care discussed with my attending, Dr. Oletta Lamas.   1. Cardiac arrest   2. Hypothermia, initial encounter   3. Respiratory failure with hypoxia   4. Altered mental status   5. Renal failure   6. Cardiac enzymes elevated      Labs and imaging considered in decision making, reviewed by myself.  Imaging interpreted by radiology. Pt care discussed with my attending, Dr. Oletta Lamas.         Toy Cookey, MD 06/30/12 [phone]

## 2012-06-30 NOTE — ED Notes (Signed)
Pt went into vfib and pt was shocked with 120 Joules used instead of 200Joules.  Pulse rate 56 and pulses returned.  CCM  Consulted.
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
## 2012-07-01 ENCOUNTER — Inpatient Hospital Stay (HOSPITAL_COMMUNITY): Payer: Medicare Other

## 2012-07-01 DIAGNOSIS — I214 Non-ST elevation (NSTEMI) myocardial infarction: Secondary | ICD-10-CM | POA: Diagnosis present

## 2012-07-01 DIAGNOSIS — J9601 Acute respiratory failure with hypoxia: Secondary | ICD-10-CM

## 2012-07-01 DIAGNOSIS — I369 Nonrheumatic tricuspid valve disorder, unspecified: Secondary | ICD-10-CM

## 2012-07-01 LAB — BLOOD GAS, ARTERIAL
Acid-base deficit: 10.4 mmol/L — ABNORMAL HIGH (ref 0.0–2.0)
Drawn by: 36274
FIO2: 0.4 %
MECHVT: 620 mL
O2 Saturation: 95.2 %
RATE: 24 resp/min
TCO2: 15.8 mmol/L (ref 0–100)
pCO2 arterial: 31.7 mmHg — ABNORMAL LOW (ref 35.0–45.0)
pO2, Arterial: 83.4 mmHg (ref 80.0–100.0)

## 2012-07-01 LAB — URINE CULTURE
Colony Count: NO GROWTH
Culture: NO GROWTH

## 2012-07-01 LAB — GLUCOSE, CAPILLARY
Glucose-Capillary: 89 mg/dL (ref 70–99)
Glucose-Capillary: 72 mg/dL (ref 70–99)
Glucose-Capillary: 92 mg/dL (ref 70–99)
Glucose-Capillary: 95 mg/dL (ref 70–99)

## 2012-07-01 LAB — HEPARIN LEVEL (UNFRACTIONATED): Heparin Unfractionated: 0.4 IU/mL (ref 0.30–0.70)

## 2012-07-01 LAB — CK TOTAL AND CKMB (NOT AT ARMC)
CK, MB: 122.2 ng/mL (ref 0.3–4.0)
Total CK: 4813 U/L — ABNORMAL HIGH (ref 7–232)

## 2012-07-01 LAB — BASIC METABOLIC PANEL
CO2: 15 mEq/L — ABNORMAL LOW (ref 19–32)
CO2: 16 mEq/L — ABNORMAL LOW (ref 19–32)
CO2: 17 mEq/L — ABNORMAL LOW (ref 19–32)
Calcium: 7.7 mg/dL — ABNORMAL LOW (ref 8.4–10.5)
Chloride: 116 mEq/L — ABNORMAL HIGH (ref 96–112)
Creatinine, Ser: 1.11 mg/dL (ref 0.50–1.35)
GFR calc non Af Amer: 45 mL/min — ABNORMAL LOW (ref >90)
Glucose, Bld: 87 mg/dL (ref 70–99)
Glucose, Bld: 88 mg/dL (ref 70–99)
Potassium: 3.6 mEq/L (ref 3.5–5.1)
Sodium: 142 mEq/L (ref 135–145)
Sodium: 145 mEq/L (ref 135–145)

## 2012-07-01 LAB — HEMOGLOBIN A1C
Hgb A1c MFr Bld: 5.7 % — ABNORMAL HIGH (ref <5.7)
Mean Plasma Glucose: 117 mg/dL — ABNORMAL HIGH (ref <117)

## 2012-07-01 LAB — LEGIONELLA ANTIGEN, URINE: Legionella Antigen, Urine: NEGATIVE

## 2012-07-01 LAB — LIPID PANEL: HDL: 49 mg/dL (ref >39)

## 2012-07-01 LAB — TROPONIN I
Troponin I: 4.75 ng/mL (ref <0.30)
Troponin I: 4.5 ng/mL (ref <0.30)

## 2012-07-01 MED ORDER — LABETALOL HCL 5 MG/ML IV SOLN
INTRAVENOUS | Status: AC
  Administered 2012-07-01: 10 mg via INTRAVENOUS
  Filled 2012-07-01: qty 4

## 2012-07-01 MED ORDER — QUETIAPINE FUMARATE 200 MG PO TABS
200.0000 mg | ORAL_TABLET | ORAL | Status: DC
  Administered 2012-07-01 – 2012-07-06 (×11): 200 mg via ORAL
  Filled 2012-07-01 (×12): qty 1

## 2012-07-01 MED ORDER — CARBIDOPA-LEVODOPA 25-100 MG PO TABS
1.0000 | ORAL_TABLET | ORAL | Status: DC
  Administered 2012-07-01 – 2012-07-06 (×26): 1 via ORAL
  Filled 2012-07-01 (×35): qty 1

## 2012-07-01 MED ORDER — CARBIDOPA-LEVODOPA 25-100 MG PO TABS
1.0000 | ORAL_TABLET | Freq: Once | ORAL | Status: AC
  Administered 2012-07-01: 1 via ORAL
  Filled 2012-07-01: qty 1

## 2012-07-01 MED ORDER — CLONAZEPAM 0.5 MG PO TABS
0.5000 mg | ORAL_TABLET | Freq: Three times a day (TID) | ORAL | Status: DC
  Administered 2012-07-01 – 2012-07-04 (×10): 0.5 mg via ORAL
  Filled 2012-07-01 (×10): qty 1

## 2012-07-01 MED ORDER — LAMOTRIGINE 150 MG PO TABS: 150.0000 mg | ORAL_TABLET | Freq: Every day | ORAL | Status: DC

## 2012-07-01 MED ORDER — BENZTROPINE MESYLATE 1 MG PO TABS
1.0000 mg | ORAL_TABLET | Freq: Three times a day (TID) | ORAL | Status: DC
  Administered 2012-07-01 – 2012-07-06 (×16): 1 mg via ORAL
  Filled 2012-07-01 (×18): qty 1

## 2012-07-01 MED ORDER — MIDAZOLAM HCL 2 MG/2ML IJ SOLN
1.0000 mg | INTRAMUSCULAR | Status: DC | PRN
  Administered 2012-07-01: 2 mg via INTRAVENOUS
  Filled 2012-07-01: qty 2

## 2012-07-01 MED ORDER — LABETALOL HCL 5 MG/ML IV SOLN
10.0000 mg | INTRAVENOUS | Status: DC | PRN
  Administered 2012-07-01 – 2012-07-04 (×3): 10 mg via INTRAVENOUS
  Filled 2012-07-01 (×3): qty 4

## 2012-07-01 MED ORDER — ACETYLCYSTEINE 20 % IN SOLN
4.0000 mL | RESPIRATORY_TRACT | Status: DC
  Administered 2012-07-01 – 2012-07-02 (×5): 4 mL via RESPIRATORY_TRACT
  Filled 2012-07-01 (×10): qty 4

## 2012-07-01 MED ORDER — METOPROLOL TARTRATE 25 MG/10 ML ORAL SUSPENSION
25.0000 mg | Freq: Two times a day (BID) | ORAL | Status: DC
  Administered 2012-07-01: 25 mg
  Filled 2012-07-01 (×2): qty 10

## 2012-07-01 MED ORDER — HEPARIN BOLUS VIA INFUSION
2000.0000 [IU] | Freq: Once | INTRAVENOUS | Status: AC
  Administered 2012-07-01: 2000 [IU] via INTRAVENOUS
  Filled 2012-07-01: qty 2000

## 2012-07-01 MED ORDER — AMLODIPINE BESYLATE 10 MG PO TABS
10.0000 mg | ORAL_TABLET | Freq: Every day | ORAL | Status: DC
  Administered 2012-07-01 – 2012-07-04 (×4): 10 mg via ORAL
  Filled 2012-07-01 (×5): qty 1

## 2012-07-01 MED ORDER — SERTRALINE HCL 100 MG PO TABS
200.0000 mg | ORAL_TABLET | Freq: Every day | ORAL | Status: DC
  Administered 2012-07-01 – 2012-07-05 (×5): 200 mg via ORAL
  Filled 2012-07-01 (×6): qty 2

## 2012-07-01 MED ORDER — ASPIRIN 81 MG PO CHEW
324.0000 mg | CHEWABLE_TABLET | Freq: Every day | ORAL | Status: DC
  Administered 2012-07-01 – 2012-07-06 (×6): 324 mg via ORAL
  Filled 2012-07-01 (×5): qty 4

## 2012-07-01 MED ORDER — LEVOTHYROXINE SODIUM 100 MCG PO TABS
100.0000 ug | ORAL_TABLET | Freq: Every day | ORAL | Status: DC
  Administered 2012-07-01 – 2012-07-06 (×6): 100 ug
  Filled 2012-07-01 (×8): qty 1

## 2012-07-01 MED ORDER — FENTANYL CITRATE 0.05 MG/ML IJ SOLN
25.0000 ug | INTRAMUSCULAR | Status: DC | PRN
  Administered 2012-07-01: 75 ug via INTRAVENOUS
  Administered 2012-07-01: 25 ug via INTRAVENOUS
  Filled 2012-07-01: qty 2

## 2012-07-01 MED ORDER — ASPIRIN 81 MG PO CHEW
CHEWABLE_TABLET | ORAL | Status: AC
  Filled 2012-07-01: qty 4

## 2012-07-01 MED ORDER — ACETYLCYSTEINE 20 % IN SOLN
4.0000 mL | RESPIRATORY_TRACT | Status: DC
  Filled 2012-07-01 (×4): qty 4

## 2012-07-01 MED ORDER — ALBUTEROL SULFATE (5 MG/ML) 0.5% IN NEBU
2.5000 mg | INHALATION_SOLUTION | RESPIRATORY_TRACT | Status: AC
  Administered 2012-07-01 – 2012-07-03 (×11): 2.5 mg via RESPIRATORY_TRACT
  Filled 2012-07-01 (×12): qty 0.5

## 2012-07-01 MED ORDER — LAMOTRIGINE 150 MG PO TABS
150.0000 mg | ORAL_TABLET | Freq: Every day | ORAL | Status: DC
  Administered 2012-07-01 – 2012-07-06 (×6): 150 mg via ORAL
  Filled 2012-07-01 (×6): qty 1

## 2012-07-01 MED ORDER — METOPROLOL TARTRATE 25 MG PO TABS
25.0000 mg | ORAL_TABLET | Freq: Two times a day (BID) | ORAL | Status: DC
  Administered 2012-07-01 – 2012-07-02 (×2): 25 mg via ORAL
  Filled 2012-07-01 (×2): qty 1

## 2012-07-01 MED ORDER — SERTRALINE HCL 100 MG PO TABS
100.0000 mg | ORAL_TABLET | Freq: Every day | ORAL | Status: DC
  Filled 2012-07-01: qty 1

## 2012-07-01 NOTE — Progress Notes (Signed)
eLink Physician-Brief Progress Note Patient Name: Jeremy Carr DOB: 08/05/1945 MRN: [id number]  Date of Service  07/01/2012   HPI/Events of Note  Patient off pressor support now with hypertension with BP of 180s systolic.  Patient has GCS of 4 with no tachycardia or breathing over the vent.     eICU Interventions  Plan: PRN labetalol 10 mg IV q2 hours prn systolic BP greater than 170    Intervention Category Intermediate Interventions: Hypertension - evaluation and management  DETERDING,ELIZABETH 07/01/2012, 5:14 AM

## 2012-07-01 NOTE — Progress Notes (Signed)
Radial Arterial Line discontinued per MD order. Tolerated procedure well, held pressure for 10 minutes and no bleeding afterwards. Patient vitals stable, RT will continue to monitor.

## 2012-07-01 NOTE — Progress Notes (Signed)
ANTICOAGULATION CONSULT NOTE - Follow Up Consult  Pharmacy Consult for heparin Indication: chest pain/ACS  Allergies  Allergen Reactions  . Haloperidol Lactate     REACTION: Paranoid and withdrawal  . Pramipexole Dihydrochloride     REACTION: unknown reaction  . Pregabalin     REACTION: unknown reaction    Patient Measurements: Height: 6' (182.9 cm) Weight: 214 lb 15.2 oz (97.5 kg) IBW/kg (Calculated) : 77.6 Heparin Dosing Weight: 97 kg  Vital Signs: Temp: 99.7 F (37.6 C) (02/26 1000) Temp src: Core (Comment) (02/26 0400) Pulse Rate: 85 (02/26 1000)  Labs:  Recent Labs  06/30/12 0841  06/30/12 1835 06/30/12 2200 06/30/12 2330 07/01/12 0235 07/01/12 0417 07/01/12 0916 07/01/12 1030  HGB 12.3*  --   --   --   --   --   --   --   --   HCT 37.3*  --   --   --   --   --   --   --   --   PLT 230  --   --   --   --   --   --   --   --   APTT 30  --   --   --   --   --   --   --   --   LABPROT 16.8*  --   --   --   --   --   --   --   --   INR 1.40  --   --   --   --   --   --   --   --   HEPARINUNFRC  --   --   --   --  0.23*  --   --  0.30  --   CREATININE 2.39*  < > 1.74*  --   --  1.53*  --   --  1.42*  CKTOTAL  --   --   --   --   --   --   --   --  4813*  CKMB  --   --   --   --   --   --   --   --  122.2*  TROPONINI  --   < >  --  6.79*  --   --  7.57*  --  6.36*  < > = values in this interval not displayed.  Estimated Creatinine Clearance: 60.3 ml/min (by C-G formula based on Cr of 1.42).   Assessment: 63 YOM admitted 2/25, unresponsive-found down by wife, body temp 82 in ED, s/p shocking for V-fib arrest.  Patient continues on heparin per pharmacy for ACS. Heparin level this AM just inside therapeutic range at 0.3 on 1350 units/hr. CBC is stable. No bleeding or infusion line issues per RN.  Goal of Therapy:  Heparin level 0.3-0.7 units/ml Monitor platelets by anticoagulation protocol: Yes   Plan:  -increase heparin gtt to 1450 units/hr to keep in  therapeutic range -6 hour heparin level to confirm -daily HL, CBC -monitor for s/sx bleeding -f/u plans for cath  Thank you for the consult.

## 2012-07-01 NOTE — Progress Notes (Signed)
  Echocardiogram 2D Echocardiogram has been performed.  Ellender Hose A 07/01/2012, 11:30 AM

## 2012-07-01 NOTE — Progress Notes (Signed)
ANTICOAGULATION CONSULT NOTE  Pharmacy Consult for Heparin Indication: NSTEMI  Patient Measurements: Height: 6' (182.9 cm) Weight: 211 lb 3.2 oz (95.8 kg) IBW/kg (Calculated) : 77.6 Heparin Dosing Weight: 95.8 kg   Vital Signs: Temp: 99 F (37.2 C) (02/26 0100) Temp src: Core (Comment) (02/26 0000) BP: 122/65 mmHg (02/25 2000) Pulse Rate: 95 (02/26 0100)  Labs:  Recent Labs  06/30/12 0841 06/30/12 0842 06/30/12 1103 06/30/12 1400 06/30/12 1835 06/30/12 2200 06/30/12 2330  HGB 12.3*  --   --   --   --   --   --   HCT 37.3*  --   --   --   --   --   --   PLT 230  --   --   --   --   --   --   APTT 30  --   --   --   --   --   --   LABPROT 16.8*  --   --   --   --   --   --   INR 1.40  --   --   --   --   --   --   HEPARINUNFRC  --   --   --   --   --   --  0.23*  CREATININE 2.39*  --  2.09*  --  1.74*  --   --   TROPONINI  --  0.31*  --  1.72*  --  6.79*  --     Estimated Creatinine Clearance: 48.8 ml/min (by C-G formula based on Cr of 1.74).  Assessment: 67 yo male s/p V-fib arrest /NSTEM for heparin  Goal of Therapy:  Heparin level 0.3-0.7 units/ml Monitor platelets by anticoagulation protocol: Yes   Plan:  Heparin 2000 units IV bolus, then increase heparin 1350 units/hr Follow-up am labs.  Geannie Risen, PharmD, BCPS  07/01/2012   1:41 AM

## 2012-07-01 NOTE — Progress Notes (Signed)
eLink Physician-Brief Progress Note Patient Name: Jeremy Carr DOB: 12/06/1945 MRN: [id number]  Date of Service  07/01/2012   HPI/Events of Note   Copious thick secretions  eICU Interventions   Mucomyst / Albuterol x 48h NTS PRN    Intervention Category Major Interventions: Acid-Base disturbance - evaluation and management;Airway management  [name] 07/01/2012, 3:46 PM

## 2012-07-01 NOTE — Progress Notes (Signed)
PULMONARY  / CRITICAL CARE MEDICINE  Name: Jeremy Carr MRN: [id number] DOB: 28-Dec-1945    ADMISSION DATE:  06/30/2012 CONSULTATION DATE: 06/30/12  REFERRING MD : EDP  CHIEF COMPLAINT:AMS/ V-Fib Arrest   BRIEF PATIENT DESCRIPTION:  67 year old male with history of Parkinson's and Bipolar Disease with 3 day history of increased sleepiness, decreased appetite and psychosis. Slid down wall at home evening of 2/24, and after 4 hours of attempting to get patient up, he slept covered with blankets on the floor. Found unresponsive by wife this am with deep slow respirations. EMS transported to [facility] ED where patient was intubated and lined. Pt with GCS 3 and hypothermic upon arrival( 80 F) with v-fib arrest on 3 separate occasions that responded to defib.and ACLS drug treatment. PCCM asked to assume care.  SIGNIFICANT EVENTS / STUDIES:  2/25: Found down at home  2/25: Intubated in ED  LINES / TUBES: 2/25 RIJ cvc>>>  2/25 ETT>>>  CULTURES: 2/25: Urine >>> 2/25: Blood>>>  2/25 sputum>> Urine strep ag neg  ANTIBIOTICS: 2/25 Unasyn per pharmacy >>>   SUBJECTIVE: Off pressors, hypertensive overnight Wide awake this am Rewarmed . VITAL SIGNS: Temp:  [89.4 F (31.9 C)-99.3 F (37.4 C)] 99.1 F (37.3 C) (02/26 0800) Pulse Rate:  [53-98] 94 (02/26 0800) Resp:  [19-24] 24 (02/26 0800) BP: (62-147)/(38-76) 122/65 mmHg (02/25 2000) SpO2:  [92 %-100 %] 96 % (02/26 0800) Arterial Line BP: (110-182)/(53-81) 182/74 mmHg (02/26 0800) FiO2 (%):  [40 %-80 %] 40 % (02/26 0922) Weight:  [95.8 kg (211 lb 3.2 oz)-97.5 kg (214 lb 15.2 oz)] 97.5 kg (214 lb 15.2 oz) (02/26 0500) HEMODYNAMICS: CVP:  [6 mmHg-8 mmHg] 7 mmHg VENTILATOR SETTINGS: Vent Mode:  [-] PSV;CPAP FiO2 (%):  [40 %-80 %] 40 % Set Rate:  [24 bmp] 24 bmp Vt Set:  [782 mL] 620 mL PEEP:  [5 cmH20] 5 cmH20 Pressure Support:  [5 cmH20] 5 cmH20 Plateau Pressure:  [14 cmH20-15 cmH20] 14 cmH20 INTAKE / OUTPUT: Intake/Output      02/25 0701 - 02/26 0700 02/26 0701 - 02/27 0700   I.V. (mL/kg) 4712.4 (48.3) 113.5 (1.2)   IV Piggyback 1300    Total Intake(mL/kg) 6012.4 (61.7) 113.5 (1.2)   Urine (mL/kg/hr) 2240 175 (0.5)   Total Output 2240 175   Net +3772.4 -61.5          PHYSICAL EXAMINATION: General: intubated white male Neuro:  Follows commands, awake, alert, pupils 3 and sluggish HEENT: No LAN   Cardiovascular: Bradycardia with abnormal EKG   Lungs:   Coarse throughout Abdomen:   Soft and flat with diminished bowel sounds Musculoskeletal: cog wheel rigidity, no injuries Skin:   Fine petechial rash to lower extremities  LABS:  Recent Labs Lab 06/30/12 0841  06/30/12 0842 06/30/12 1103 06/30/12 1119 06/30/12 1139 06/30/12 1151 06/30/12 1400 06/30/12 1835 06/30/12 2200 07/01/12 0235 07/01/12 0411 07/01/12 0417  HGB 12.3*  --   --   --   --   --   --   --   --   --   --   --   --   WBC 9.8  --   --   --   --   --   --   --   --   --   --   --   --   PLT 230  --   --   --   --   --   --   --   --   --   --   --   --  NA 140  --   --  142  --   --   --   --  143  --  142  --   --   K 3.6  --   --  2.8*  --   --   --   --  3.1*  --  3.6  --   --   CL 103  --   --  109  --   --   --   --  114*  --  112  --   --   CO2 17*  --   --  18*  --   --   --   --  16*  --  15*  --   --   GLUCOSE 199*  --   --  143*  --   --   --   --  84  --  87  --   --   BUN 39*  --   --  37*  --   --   --   --  37*  --  34*  --   --   CREATININE 2.39*  --   --  2.09*  --   --   --   --  1.74*  --  1.53*  --   --   CALCIUM 8.7  --   --  8.6  --   --   --   --  7.7*  --  7.9*  --   --   MG  --   --   --   --   --   --  2.5  --   --   --   --   --   --   PHOS  --   --   --   --   --   --  5.6*  --   --   --   --   --   --   AST 302*  --   --   --   --   --   --   --   --   --   --   --   --   ALT 22  --   --   --   --   --   --   --   --   --   --   --   --   ALKPHOS 143*  --   --   --   --   --   --   --   --   --    --   --   --   BILITOT 0.3  --   --   --   --   --   --   --   --   --   --   --   --   PROT 7.2  --   --   --   --   --   --   --   --   --   --   --   --   ALBUMIN 3.2*  --   --   --   --   --   --   --   --   --   --   --   --   APTT 30  --   --   --   --   --   --   --   --   --   --   --   --  INR 1.40  --   --   --   --   --   --   --   --   --   --   --   --   LATICACIDVEN  --   --  4.32*  --   --  1.7  --   --   --   --   --   --   --   TROPONINI  --   < > 0.31*  --   --   --   --  1.72*  --  6.79*  --   --  7.57*  PROBNP  --   --  235.7*  --   --   --   --   --   --   --   --   --   --   PHART  --   --   --   --  7.477*  --   --   --   --   --   --  7.292*  --   PCO2ART  --   --   --   --  21.0*  --   --   --   --   --   --  31.7*  --   PO2ART  --   --   --   --  51.0*  --   --   --   --   --   --  83.4  --   < > = values in this interval not displayed.  Recent Labs Lab 06/30/12 1826 06/30/12 1941 06/30/12 2319 07/01/12 0415 07/01/12 0739  GLUCAP 83 89 81 82 72    Imaging: Ct Head Wo Contrast  06/30/2012  *RADIOLOGY REPORT*  Clinical Data: Unresponsive  CT HEAD WITHOUT CONTRAST  Technique:  Contiguous axial images were obtained from the base of the skull through the vertex without contrast.  Comparison: Brain MRI 08/06/2010, head CT 08/03/2010  Findings: The patient is tilted in the scanner. No acute intracranial hemorrhage.  No focal mass lesion.  No CT evidence of acute infarction.   No midline shift or mass effect.  No hydrocephalus.  Basilar cisterns are patent.  There is generalized cortical atrophy similar to prior.  There is mild periventricular and subcortical white matter hypodensities also similar to prior. Small 5 mm hypodensity in the left basal ganglia (image 17) is  not clearly seen on prior.  Paranasal sinuses and mastoid air cells are clear.  Orbits are normal.  IMPRESSION:  1.  No acute intracranial findings. 2.  Atrophy and microvascular disease similar to prior.  3.  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   Original Report Authenticated By: Genevive Bi, M.D.    Portable Chest Xray In Am  07/01/2012  *RADIOLOGY REPORT*  Clinical Data: Endotracheal tube, shortness of breath.  PORTABLE CHEST - 1 VIEW  Comparison: 06/30/2012.  Findings: Endotracheal tube is in satisfactory position. Nasogastric tube is followed into the stomach with the tip projecting beyond the inferior boundary of the film.  Right IJ central line tip projects over the SVC.  Heart size stable.  There is bibasilar air space disease with collapse/consolidation in the left lower lobe.  Bilateral pleural effusions persist.  IMPRESSION: Bibasilar air space disease with left lower lobe collapse/consolidation, and bilateral pleural effusions.   Original Report Authenticated By: Leanna Battles, M.D.    Dg Chest Port 1 View  06/30/2012  *RADIOLOGY REPORT*  Clinical  Data: Chest pain  PORTABLE CHEST - 1 VIEW  Comparison: 08/03/2010  Findings: The endotracheal tube tip is above the carina.  There is a right IJ catheter with the tip in the right atrium.  Heart size is mildly enlarged.  There are bilateral pleural effusions and mild interstitial edema consistent with CHF.  Atelectasis noted within both lung bases.  IMPRESSION:  1.  Satisfactory position of ET tube with tip above the carina. 2.  The right IJ catheter tip is in the right atrium.  No pneumothorax identified. 3.  Pleural effusions and pulmonary edema consistent with CHF.   Original Report Authenticated By: Signa Kell, M.D.       ASSESSMENT / PLAN:  PULMONARY A:VDRF  In setting of AMS and cardiac arrest Pulmonary edema  COPD   P: SBTs- extubate   CARDIOVASCULAR A: Shock Post V-Fib arrest NSTEMI - peak tp 7.5 ST changes mimicking MI in setting of hypothermia CHF Prolonged QT P:  Cardiology Consulted Metoprolol, cath eventually  RENAL   Recent Labs Lab 06/30/12 0841 06/30/12 1103 06/30/12 1835  07/01/12 0235  CREATININE 2.39* 2.09* 1.74* 1.53*     A:  ARF -resolving R/O Rhabdo P:   Serial Creatinine Gentle volume repletion 50/h Follow Q12 BMETS chk CK  GASTROINTESTINAL A: NPO P:resume po post extubation  HEMATOLOGIC A: Anemia  P: Monitor CBC  scd  INFECTIOUS A: hypothermia ? Cause ? sepsis R/O Aspiration event P:   UNasyn bc sputum  ENDOCRINE A: Hypothyroidism -TSH 3.6, cortisol 28 P: Continue synthroid Monitor CBG's    NEUROLOGIC A:  AMS unknown etiology(unknown down time) Parkinsons Disease BiPolar Disease CT Head neg P:   continue Parkinsonian medications if able to give oral Hold seroquel for long qT No haldol etc   GLOBAL - extubate & start PO    I have personally obtained a history, examined the patient, evaluated laboratory and imaging results, formulated the assessment and plan and placed orders. CRITICAL CARE: The patient is critically ill with multiple organ systems failure and requires high complexity decision making for assessment and support, frequent evaluation and titration of therapies, application of advanced monitoring technologies and extensive interpretation of multiple databases. Critical Care Time devoted to patient care services described in this note is 35 minutes.   ALVA,RAKESH V.  [phone]

## 2012-07-01 NOTE — Progress Notes (Signed)
ANTICOAGULATION CONSULT NOTE - Follow Up Consult  Pharmacy Consult for heparin Indication: chest pain/ACS  Allergies  Allergen Reactions  . Haloperidol Lactate     REACTION: Paranoid and withdrawal  . Pramipexole Dihydrochloride     REACTION: unknown reaction  . Pregabalin     REACTION: unknown reaction    Patient Measurements: Height: 6' (182.9 cm) Weight: 214 lb 15.2 oz (97.5 kg) IBW/kg (Calculated) : 77.6 Heparin Dosing Weight: 97 kg  Vital Signs: Temp: 98.1 F (36.7 C) (02/26 1800) Pulse Rate: 86 (02/26 1800)  Labs:  Recent Labs  06/30/12 0841  06/30/12 1835  06/30/12 2330 07/01/12 0235 07/01/12 0417 07/01/12 0916 07/01/12 1030 07/01/12 1740 07/01/12 1744  HGB 12.3*  --   --   --   --   --   --   --   --   --   --   HCT 37.3*  --   --   --   --   --   --   --   --   --   --   PLT 230  --   --   --   --   --   --   --   --   --   --   APTT 30  --   --   --   --   --   --   --   --   --   --   LABPROT 16.8*  --   --   --   --   --   --   --   --   --   --   INR 1.40  --   --   --   --   --   --   --   --   --   --   HEPARINUNFRC  --   --   --   --  0.23*  --   --  0.30  --   --  0.40  CREATININE 2.39*  < > 1.74*  --   --  1.53*  --   --  1.42*  --   --   CKTOTAL  --   --   --   --   --   --   --   --  4813*  --   --   CKMB  --   --   --   --   --   --   --   --  122.2*  --   --   TROPONINI  --   < >  --   < >  --   --  7.57*  --  6.36* 4.75*  --   < > = values in this interval not displayed.  Estimated Creatinine Clearance: 60.3 ml/min (by C-G formula based on Cr of 1.42).   Assessment: 79 YOM admitted 2/25, unresponsive-found down by wife, body temp 82 in ED, s/p shocking for V-fib arrest.  Patient continues on heparin per pharmacy for ACS. No bleeding or infusion line issues noted.  Goal of Therapy:  Heparin level 0.3-0.7 units/ml Monitor platelets by anticoagulation protocol: Yes   Plan:  -Continue heparin gtt at 1450 units/hr -daily HL,  CBC -monitor for s/sx bleeding -f/u plans for cath  Thank you for the consult.  Asser Lucena L. Illene Bolus, PharmD, BCPS Clinical Pharmacist Pager: [phone] Pharmacy: [phone] 07/01/2012 6:39 PM

## 2012-07-01 NOTE — Progress Notes (Signed)
Patient Name: Jeremy Carr Date of Encounter: 07/01/2012   Principal Problem:   NSTEMI (non-ST elevated myocardial infarction) Active Problems:   HYPOTHYROIDISM   HYPERLIPIDEMIA   DISORDER, BIPOLAR NOS   PARKINSON'S DISEASE   HYPERTENSION   COPD    SUBJECTIVE Patient became hypertensive overnight - levophed discontinued and labetalol added prn.  Receiving fentanyl and versed prn.   CURRENT MEDS . ampicillin-sulbactam (UNASYN) IV  1.5 g Intravenous Q6H  . antiseptic oral rinse  15 mL Mouth Rinse QID  . aspirin  300 mg Rectal Daily  . atorvastatin  80 mg Per Tube q1800  . chlorhexidine  15 mL Mouth Rinse BID  . pantoprazole (PROTONIX) IV  40 mg Intravenous QHS  . sodium chloride  1,000 mL Intravenous Once    OBJECTIVE  Filed Vitals:   07/01/12 0300 07/01/12 0400 07/01/12 0500 07/01/12 0600  BP:    132/89  Pulse: 96 95 94 89  Temp: 98.8 F (37.1 C) 98.6 F (37 C) 98.8 F (37.1 C) 99.1 F (37.3 C)  TempSrc:  Core (Comment)    Resp: 24 24 24 24   Height:      Weight:   214 lb 15.2 oz (97.5 kg)   SpO2: 95% 94% 95% 97%    Intake/Output Summary (Last 24 hours) at 07/01/12 0626 Last data filed at 07/01/12 0600  Gross per 24 hour  Intake 5878.9 ml  Output   2240 ml  Net 3638.9 ml   Filed Weights   06/30/12 1042 06/30/12 1400 07/01/12 0500  Weight: 214 lb 8.1 oz (97.3 kg) 211 lb 3.2 oz (95.8 kg) 214 lb 15.2 oz (97.5 kg)    PHYSICAL EXAM CVP 6 Neuro: sedated but arousable (opens eyes to name, grips hand) HEENT:  Intubated  Neck: Supple without bruits, difficult to apprecaite JVD Lungs:  Resp regular and unlabored, CTA. Heart: RRR no s3, s4, or murmurs. Abdomen: Soft, non-tender, non-distended, BS + x 4.  Extremities: No clubbing, cyanosis or edema. DP/PT/Radials 2+ and equal bilaterally.  Accessory Clinical Findings  CBC  Recent Labs  06/30/12 0841  WBC 9.8  NEUTROABS 8.1*  HGB 12.3*  HCT 37.3*  MCV 91.4  PLT 230   Basic Metabolic  Panel  Recent Labs  62/13/08 1103 06/30/12 1151 06/30/12 1835 07/01/12 0235  NA 142  --  143 142  K 2.8*  --  3.1* 3.6  CL 109  --  114* 112  CO2 18*  --  16* 15*  GLUCOSE 143*  --  84 87  BUN 37*  --  37* 34*  CREATININE 2.09*  --  1.74* 1.53*  CALCIUM 8.6  --  7.7* 7.9*  MG  --  2.5  --   --   PHOS  --  5.6*  --   --    Liver Function Tests  Recent Labs  06/30/12 0841  AST 302*  ALT 22  ALKPHOS 143*  BILITOT 0.3  PROT 7.2  ALBUMIN 3.2*   Cardiac Enzymes  Recent Labs  06/30/12 1400 06/30/12 2200 07/01/12 0417  TROPONINI 1.72* 6.79* 7.57*   Thyroid Function Tests  Recent Labs  06/30/12 1103  TSH 3.621    TELE - sinus rhythm  ECG - sinus rhythm, 93 without ST elevation  Radiology/Studies Dg Chest Port 1 View 07/01/2012 - pending  Dg Chest Port 1 View 06/30/2012   IMPRESSION:  1.  Satisfactory position of ET tube with tip above the carina. 2.  The right IJ  catheter tip is in the right atrium.  No pneumothorax identified. 3.  Pleural effusions and pulmonary edema consistent with CHF.     Ct Head Wo Contrast 06/30/2012   IMPRESSION:  1.  No acute intracranial findings. 2.  Atrophy and microvascular disease similar to prior. 3.  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      ASSESSMENT AND PLAN 67 yo M with history of HTN, HLD, TIA, Bipolar disorder and parkinson's disease who was found unresponsive by his wife at around 7am on the morning of admission (06/30/12).   #Acute respiratory failure: intubated with vent mgmt per PCCM; Unasyn started for ?Aspiration PNA; sedation with versed/fentanyl prn  #NSTEMI: Initially thought to be STEMI at admission, but code STEMI cancelled, as ST changes thought to be secondary to hypothermia; troponin has risen to 7.57 -follow AM ekg -ASA, statin per tube -continue heparin gtt -Add scheduled metoprolol 25mg  BID per tube (HR can now tolerate) -Echo pending (pBNP only mildly elevated - 236)   -Consider cath today/tomorrow as renal function improving vs cycle another enzyme at 11am -Risk stratification: check A1c & lipid panel  #Hypothermia: due to poor perfusion, temp improved with warming blanket, and Osborne waves on EKG resolved  #VF Cardiac Arrest: Responsive to defibrillation, likely secondary to acute coronary event   #Acute renal failure: Cr = 2.39 at admission, and has improved to 1.53 (baseline around 1.2-1.3). Likely prerenal in setting of cardiac arrest. Appropriate urine output. Continue to monitor.  Receiving NS @ 100cc/h.  #HTN: Home regimen includes norvasc 10mg  daily; well hold norvasc and schedule BB as he is now off pressors requiring prn labetalol  #HLD: Atorvastatin 80mg  qHS  #Hypothyroidism: TSH wnl, suggest restarting home dose synthroid daily  #Bipolar disorder   #Parkinson's disease   Signed, Stacy Gardner MD, PGY2  History and all data above reviewed.  Patient examined.  I agree with the findings as above. Opens eyes.  IntubatedThe patient exam reveals COR:RRR  ,  Lungs: Clear  ,  Abd: Decreased bowel sounds, Ext No edema  .  All available labs, radiology testing, previous records reviewed. Agree with documented assessment and plan. V Fib/NQWMI - I suspect this is secondary.  He will need a cardiac cath.  Echo today.  Start low dose beta blocker.    Rollene Rotunda  7:58 AM  07/01/2012

## 2012-07-01 NOTE — Procedures (Signed)
Extubation Procedure Note  Patient Details:   Name: JOSH NICOLOSI DOB: [birth year] MRN: [id number]   Airway Documentation:     Evaluation  O2 sats: stable throughout Complications: No apparent complications Patient did tolerate procedure well. Bilateral Breath Sounds: Rhonchi Suctioning: Airway Yes BBS rhonchi with thick green secretions Vocalized well post extubation  Newt Lukes 07/01/2012, 12:29 PM

## 2012-07-02 ENCOUNTER — Encounter (HOSPITAL_COMMUNITY)
Admission: EM | Disposition: A | Discharge status: Skilled Nursing Facility | Payer: Self-pay | Source: Home / Self Care | Attending: Internal Medicine

## 2012-07-02 DIAGNOSIS — I214 Non-ST elevation (NSTEMI) myocardial infarction: Secondary | ICD-10-CM

## 2012-07-02 DIAGNOSIS — I469 Cardiac arrest, cause unspecified: Secondary | ICD-10-CM

## 2012-07-02 HISTORY — PX: LEFT HEART CATHETERIZATION WITH CORONARY ANGIOGRAM: SHX5451

## 2012-07-02 LAB — CULTURE, RESPIRATORY W GRAM STAIN

## 2012-07-02 LAB — BASIC METABOLIC PANEL
CO2: 20 mEq/L (ref 19–32)
Chloride: 115 mEq/L — ABNORMAL HIGH (ref 96–112)
GFR calc non Af Amer: 73 mL/min — ABNORMAL LOW (ref >90)
Glucose, Bld: 113 mg/dL — ABNORMAL HIGH (ref 70–99)
Potassium: 3.3 mEq/L — ABNORMAL LOW (ref 3.5–5.1)
Sodium: 146 mEq/L — ABNORMAL HIGH (ref 135–145)

## 2012-07-02 LAB — URINE MICROSCOPIC-ADD ON

## 2012-07-02 LAB — URINALYSIS, ROUTINE W REFLEX MICROSCOPIC
Ketones, ur: NEGATIVE mg/dL
Leukocytes, UA: NEGATIVE
Nitrite: NEGATIVE
Protein, ur: 30 mg/dL — AB
Urobilinogen, UA: 1 mg/dL (ref 0.0–1.0)
pH: 6 (ref 5.0–8.0)

## 2012-07-02 LAB — CBC
HCT: 30.2 % — ABNORMAL LOW (ref 39.0–52.0)
Hemoglobin: 10.3 g/dL — ABNORMAL LOW (ref 13.0–17.0)
MCH: 30.4 pg (ref 26.0–34.0)
MCHC: 34.1 g/dL (ref 30.0–36.0)

## 2012-07-02 LAB — GLUCOSE, CAPILLARY
Glucose-Capillary: 98 mg/dL (ref 70–99)
Glucose-Capillary: 97 mg/dL (ref 70–99)

## 2012-07-02 LAB — URINE CULTURE

## 2012-07-02 SURGERY — LEFT HEART CATHETERIZATION WITH CORONARY ANGIOGRAM
Anesthesia: LOCAL

## 2012-07-02 MED ORDER — SODIUM CHLORIDE 0.9 % IV SOLN: INTRAVENOUS | Status: DC

## 2012-07-02 MED ORDER — ACETAMINOPHEN 325 MG PO TABS
650.0000 mg | ORAL_TABLET | ORAL | Status: DC | PRN
  Administered 2012-07-06: 650 mg via ORAL
  Filled 2012-07-02: qty 2

## 2012-07-02 MED ORDER — SODIUM CHLORIDE 0.9 % IV SOLN
3.0000 g | Freq: Four times a day (QID) | INTRAVENOUS | Status: DC
  Administered 2012-07-02 – 2012-07-05 (×12): 3 g via INTRAVENOUS
  Filled 2012-07-02 (×14): qty 3

## 2012-07-02 MED ORDER — SODIUM CHLORIDE 0.9 % IV SOLN: 250.0000 mL | INTRAVENOUS | Status: DC | PRN

## 2012-07-02 MED ORDER — METOPROLOL TARTRATE 50 MG PO TABS
50.0000 mg | ORAL_TABLET | Freq: Two times a day (BID) | ORAL | Status: DC
  Administered 2012-07-02 – 2012-07-03 (×2): 50 mg via ORAL
  Filled 2012-07-02 (×3): qty 1

## 2012-07-02 MED ORDER — SODIUM CHLORIDE 0.9 % IJ SOLN: 3.0000 mL | Freq: Two times a day (BID) | INTRAMUSCULAR | Status: DC

## 2012-07-02 MED ORDER — POTASSIUM CHLORIDE 10 MEQ/50ML IV SOLN
10.0000 meq | INTRAVENOUS | Status: AC
  Administered 2012-07-02 (×2): 10 meq via INTRAVENOUS
  Filled 2012-07-02: qty 50

## 2012-07-02 MED ORDER — SODIUM CHLORIDE 0.9 % IJ SOLN: 3.0000 mL | INTRAMUSCULAR | Status: DC | PRN

## 2012-07-02 MED ORDER — HEPARIN (PORCINE) IN NACL 2-0.9 UNIT/ML-% IJ SOLN
INTRAMUSCULAR | Status: AC
  Filled 2012-07-02: qty 2000

## 2012-07-02 MED ORDER — SODIUM CHLORIDE 0.9 % IV SOLN: INTRAVENOUS | Status: AC

## 2012-07-02 MED ORDER — LIDOCAINE HCL (PF) 1 % IJ SOLN
INTRAMUSCULAR | Status: AC
  Filled 2012-07-02: qty 30

## 2012-07-02 MED ORDER — METOPROLOL TARTRATE 25 MG PO TABS
25.0000 mg | ORAL_TABLET | Freq: Once | ORAL | Status: AC
  Administered 2012-07-02: 25 mg via ORAL

## 2012-07-02 MED ORDER — POTASSIUM CHLORIDE 10 MEQ/50ML IV SOLN
INTRAVENOUS | Status: AC
  Filled 2012-07-02: qty 50

## 2012-07-02 MED ORDER — ONDANSETRON HCL 4 MG/2ML IJ SOLN: 4.0000 mg | Freq: Four times a day (QID) | INTRAMUSCULAR | Status: DC | PRN

## 2012-07-02 NOTE — Progress Notes (Signed)
ANTIBIOTIC CONSULT NOTE - FOLLOW UP  Pharmacy Consult for Unasyn Indication: suspected aspiration pneumonia  Allergies  Allergen Reactions  . Haloperidol Lactate     REACTION: Paranoid and withdrawal  . Pramipexole Dihydrochloride     REACTION: unknown reaction  . Pregabalin     REACTION: unknown reaction    Patient Measurements: Height: 6' (182.9 cm) Weight: 215 lb 6.2 oz (97.7 kg) IBW/kg (Calculated) : 77.6   Vital Signs: Temp: 98.5 F (36.9 C) (02/27 0730) Temp src: Oral (02/27 0730) BP: 153/75 mmHg (02/27 0800) Pulse Rate: 78 (02/27 0800) Intake/Output from previous day: 02/26 0701 - 02/27 0700 In: 3563.6 [I.V.:3263.6; IV Piggyback:300] Out: 1865 [Urine:1865] Intake/Output from this shift: Total I/O In: 114.5 [I.V.:114.5] Out: -   Labs:  Recent Labs  06/30/12 0841  07/01/12 0235 07/01/12 1030 07/01/12 2245 07/02/12 0500  WBC 9.8  --   --   --   --  6.1  HGB 12.3*  --   --   --   --  10.3*  PLT 230  --   --   --   --  183  CREATININE 2.39*  < > 1.53* 1.42* 1.11  --   < > = values in this interval not displayed. Estimated Creatinine Clearance: 77.1 ml/min (by C-G formula based on Cr of 1.11).   Assessment: 21 YOM admitted 2/25, unresponsive-found down by wife, body temp 82 in ED, s/p shocking for V-fib arrest.  Pharmacy has been consulted to dose Unasyn (D#3) for suspected aspiration pneumonia. WBC remain wnl and patient remains afebrile. Chest Xray does show LLL collapse/consolidation.  SCr down to 1.11 from 1.7 on admission. UOP good at 0.8/kg/h. Unasyn dose will be increased for normalization of renal function.  Microbiology: 2/25 UCx: NG 2/25 BCx x2: ngtd 2/25 RCx: reincub 2/25 MRSA PCR (-)  Goal of Therapy:  eradication of infection  Plan:  -incr Unasyn dose to 3g IV q6h -f/u culture results, clinical course, antibiotic length of therapy  Thank you for the consult.  Tomi Bamberger, PharmD Clinical Pharmacist Pager:  [phone] Pharmacy: [phone] 07/02/2012 9:52 AM

## 2012-07-02 NOTE — Progress Notes (Signed)
 Patient Name: Jeremy Carr Date of Encounter: 07/02/2012   Principal Problem:   NSTEMI (non-ST elevated myocardial infarction) Active Problems:   HYPOTHYROIDISM   HYPERLIPIDEMIA   DISORDER, BIPOLAR NOS   PARKINSON'S DISEASE   HYPERTENSION   COPD   Acute respiratory failure with hypoxia    SUBJECTIVE Extubated yesterday, no acute events overnight; per RN, he is oriented to self and place, but otherwise confused yesterday, did not fall asleep until 4am; currently he denies CP/SOB  CURRENT MEDS . acetylcysteine  4 mL Nebulization Q4H  . albuterol  2.5 mg Nebulization Q4H  . amLODipine  10 mg Oral Daily  . ampicillin-sulbactam (UNASYN) IV  1.5 g Intravenous Q6H  . antiseptic oral rinse  15 mL Mouth Rinse QID  . aspirin  324 mg Oral Daily  . atorvastatin  80 mg Per Tube q1800  . benztropine  1 mg Oral TID  . carbidopa-levodopa  1 tablet Oral Custom  . chlorhexidine  15 mL Mouth Rinse BID  . clonazePAM  0.5 mg Oral TID  . lamoTRIgine  150 mg Oral Daily  . levothyroxine  100 mcg Per Tube QAC breakfast  . metoprolol tartrate  25 mg Oral BID  . pantoprazole (PROTONIX) IV  40 mg Intravenous QHS  . QUEtiapine  200 mg Oral Custom  . sertraline  200 mg Oral QHS  . sodium chloride  1,000 mL Intravenous Once    OBJECTIVE  Filed Vitals:   07/02/12 0400 07/02/12 0500 07/02/12 0600 07/02/12 0700  BP: 100/65 121/66 134/74 137/87  Pulse: 74 76 77 75  Temp: 98.3 F (36.8 C)     TempSrc: Axillary     Resp: 9 10 11 10  Height:      Weight:  215 lb 6.2 oz (97.7 kg)    SpO2: 94% 90% 91% 93%    Intake/Output Summary (Last 24 hours) at 07/02/12 0714 Last data filed at 07/02/12 0700  Gross per 24 hour  Intake 3563.55 ml  Output   1865 ml  Net 1698.55 ml   Filed Weights   06/30/12 1400 07/01/12 0500 07/02/12 0500  Weight: 211 lb 3.2 oz (95.8 kg) 214 lb 15.2 oz (97.5 kg) 215 lb 6.2 oz (97.7 kg)    PHYSICAL EXAM  General: NAD. Neuro: Arousable, sleepy but responds to  name HEENT:  Normal, extubated  Neck: Supple without bruits or JVD. Lungs:  Resp regular and unlabored, CTA anteriorly Heart: RRR no s3, s4, or murmurs. Abdomen: Soft, non-tender, non-distended, BS + x 4.  Extremities: No clubbing, cyanosis or edema. DP/PT/Radials 2+ and equal bilaterally.  Accessory Clinical Findings  CBC  Recent Labs  06/30/12 0841 07/02/12 0500  WBC 9.8 6.1  NEUTROABS 8.1*  --   HGB 12.3* 10.3*  HCT 37.3* 30.2*  MCV 91.4 89.1  PLT 230 183   Basic Metabolic Panel  Recent Labs  06/30/12 1103 06/30/12 1151  07/01/12 1030 07/01/12 2245  NA 142  --   < > 145 145  K 2.8*  --   < > 3.5 3.3*  CL 109  --   < > 116* 116*  CO2 18*  --   < > 16* 17*  GLUCOSE 143*  --   < > 88 99  BUN 37*  --   < > 31* 25*  CREATININE 2.09*  --   < > 1.42* 1.11  CALCIUM 8.6  --   < > 7.7* 7.8*  MG  --  2.5  --   --   --     PHOS  --  5.6*  --   --   --   < > = values in this interval not displayed. Liver Function Tests  Recent Labs  06/30/12 0841  AST 302*  ALT 22  ALKPHOS 143*  BILITOT 0.3  PROT 7.2  ALBUMIN 3.2*   Cardiac Enzymes  Recent Labs  07/01/12 0417 07/01/12 1030 07/01/12 1740 07/01/12 2254  CKTOTAL  --  4813*  --   --   CKMB  --  122.2*  --   --   TROPONINI 7.57* 6.36* 4.75* 4.50*   Hemoglobin A1C  Recent Labs  07/01/12 1030  HGBA1C 5.7*   Fasting Lipid Panel  Recent Labs  07/01/12 0650  CHOL 92  HDL 49  LDLCALC 32  TRIG 57  CHOLHDL 1.9   Thyroid Function Tests  Recent Labs  06/30/12 1103  TSH 3.621    TELE - sinus rhythm  Radiology/Studies  Ct Head Wo Contrast 06/30/2012   IMPRESSION:  1.  No acute intracranial findings. 2.  Atrophy and microvascular disease similar to prior. 3.  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    Portable Chest Xray 07/01/2012  IMPRESSION: Bibasilar air space disease with left lower lobe collapse/consolidation, and bilateral pleural effusions.     2D Echo  (07/01/12) Study Conclusions - Left ventricle: The cavity size was normal. Wall thicknesswas increased in a pattern of mild LVH. Systolic function was normal. The estimated ejection fraction was in the range of 60% to 65%. Wall motion was normal; there were no regional wall motion abnormalities. - Right atrium: The atrium was mildly dilated.   ASSESSMENT AND PLAN 67 yo M with history of HTN, HLD, TIA, Bipolar disorder and parkinson's disease who was found unresponsive by his wife at around 7am on the morning of admission (06/30/12).   #Acute respiratory failure: Extubated on 07/01/12; Unasyn Day 3 for ?Aspiration PNA  #NSTEMI: Initially thought to be STEMI at admission, but code STEMI cancelled, as ST changes thought to be secondary to hypothermia; troponin peaked at 7.57 and has trended down to 4.5. Echo with EF 60-65% without regional wall motion abnormalities. A1c 5.7, LDL 32 -ASA, statin -Increase metoprolol to 50mg BID (HR 85-111) -continue heparin gtt  -Cath today as acute renal failure resolved  #Hypothermia: Resolved; with osborne waves at admission  #VF Cardiac Arrest: No further arrythmias; Responsive to defibrillation, likely secondary to hypothermia vs less likely acute coronary event    #Acute renal failure: Resolved; Cr = 2.39 at admission, and has improved to 1.11 (baseline around 1.2-1.3). Likely prerenal in setting of cardiac arrest & rhabdo. Appropriate urine output.   #HTN: Home regimen includes norvasc 10mg daily; discontinue norvasc and manage BP with BB for now   #HLD: Atorvastatin 80mg qHS   #Acute normocytic anemia: Hb 10.3 from 12.3 two days prior; likely dilutional and due to critical illness, as there is no obvious source of large blood loss (only hematuria on admission UA); continue to monitor  #Hypothyroidism: TSH wnl, continue home dose synthroid 100mcg daily   #Bipolar disorder : on home klonopin, lamotrigine, sertraline and seroquel  #Parkinson's disease  : on home Benztropine and sinemet    Signed, SHARDA, NEEMA MD, PGY2  Patient seen, examined. Available data reviewed. Agree with findings, assessment, and plan as outlined by Dr Sharda. Exam shows an elderly gentleman in no distress. Heart is regular rate and rhythm without murmur. Lung fields are clear. There is mild peripheral edema present. The patient's   clinical history and lab and radiographic data was carefully reviewed. He has had significant cardiac enzyme elevation but no symptoms that are typical of acute coronary syndrome. I think it is elevated CK is clearly secondary to rhabdomyolysis. His left ventricular systolic function is normal and I think it is clear he has not had a large myocardial infarction. Considering his elevated troponin, he will undergo diagnostic cardiac catheterization and possible PCI. The risks, indications, and alternatives were reviewed with the patient and his wife in detail.  Jaber Dunlow, M.D. 07/02/2012 11:49 AM   

## 2012-07-02 NOTE — Interval H&P Note (Signed)
History and Physical Interval Note:  07/02/2012 3:47 PM  Jeremy Carr  has presented today for cardiac cath  with the diagnosis of cardiac arrest. The various methods of treatment have been discussed with the patient and family. After consideration of risks, benefits and other options for treatment, the patient has consented to  Procedure(s): LEFT HEART CATHETERIZATION WITH CORONARY ANGIOGRAM (N/A) as a surgical intervention .  The patient's history has been reviewed, patient examined, no change in status, stable for surgery.  I have reviewed the patient's chart and labs.  Questions were answered to the patient's satisfaction.     Alexandre Lightsey

## 2012-07-02 NOTE — Progress Notes (Signed)
ANTICOAGULATION CONSULT NOTE - Follow Up Consult  Pharmacy Consult for heparin Indication: chest pain/ACS  Allergies  Allergen Reactions  . Haloperidol Lactate     REACTION: Paranoid and withdrawal  . Pramipexole Dihydrochloride     REACTION: unknown reaction  . Pregabalin     REACTION: unknown reaction    Patient Measurements: Height: 6' (182.9 cm) Weight: 215 lb 6.2 oz (97.7 kg) IBW/kg (Calculated) : 77.6 Heparin Dosing Weight: 97 kg  Vital Signs: Temp: 98.5 F (36.9 C) (02/27 0730) Temp src: Oral (02/27 0730) BP: 137/87 mmHg (02/27 0700) Pulse Rate: 75 (02/27 0700)  Labs:  Recent Labs  06/30/12 0841  07/01/12 0235 07/01/12 0417 07/01/12 0916 07/01/12 1030 07/01/12 1740 07/01/12 1744 07/01/12 2245 07/01/12 2254 07/02/12 0500  HGB 12.3*  --   --   --   --   --   --   --   --   --  10.3*  HCT 37.3*  --   --   --   --   --   --   --   --   --  30.2*  PLT 230  --   --   --   --   --   --   --   --   --  183  APTT 30  --   --   --   --   --   --   --   --   --   --   LABPROT 16.8*  --   --   --   --   --   --   --   --   --   --   INR 1.40  --   --   --   --   --   --   --   --   --   --   HEPARINUNFRC  --   < >  --   --  0.30  --   --  0.40  --   --  0.57  CREATININE 2.39*  < > 1.53*  --   --  1.42*  --   --  1.11  --   --   CKTOTAL  --   --   --   --   --  4813*  --   --   --   --   --   CKMB  --   --   --   --   --  122.2*  --   --   --   --   --   TROPONINI  --   < >  --  7.57*  --  6.36* 4.75*  --   --  4.50*  --   < > = values in this interval not displayed.  Estimated Creatinine Clearance: 77.1 ml/min (by C-G formula based on Cr of 1.11).   Assessment: Jeremy Carr admitted 2/25, unresponsive-found down by wife, body temp 82 in ED, s/p shocking for V-fib arrest.  Patient continues on heparin per pharmacy for ACS. No bleeding or infusion line issues noted.  Heparin level this AM in goal range at 0.57 on 1450 units/hr. Hgb has decreased to 10.3, plts down to  183 (?dilutional).  Goal of Therapy:  Heparin level 0.3-0.7 units/ml Monitor platelets by anticoagulation protocol: Yes   Plan:  -Continue heparin gtt at 1450 units/hr -daily HL, CBC -monitor for s/sx bleeding -f/u plans for cath  Thank you for the consult.  Tomi Bamberger, PharmD Clinical Pharmacist Pager: [phone] Pharmacy: [phone] 07/02/2012 8:16 AM

## 2012-07-02 NOTE — Evaluation (Signed)
Physical Therapy Evaluation Patient Details Name: Jeremy Carr MRN: [id number] DOB: [birth year] Today's Date: 07/02/2012 Time: 1110-1200 PT Time Calculation (min): 50 min  PT Assessment / Plan / Recommendation Clinical Impression  67 year old male with history of Parkinson's and Bipolar Disease with 3 day history of increased sleepiness, decreased appetite and psychosis. Slid down wall at home evening of 2/24, and after 4 hours of attempting to get patient up, he slept covered with blankets on the floor. Found unresponsive by wife this am with deep slow respirations. EMS transported to [facility] ED where patient was intubated and lined. Pt with GCS 3 and hypothermic upon arrival( 80 F) with v-fib arrest on 3 separate occasions that responded to defib.and ACLS drug treatment.   Presents to PT with below impairments affecting prior level of function. Will benefit physical therapy in the acute setting to maximize functional independence for decreased burden of care at next venue.     PT Assessment  Patient needs continued PT services    Follow Up Recommendations  CIR    Does the patient have the potential to tolerate intense rehabilitation      Barriers to Discharge   wife is limited physically in how much she can help him    Equipment Recommendations  None recommended by PT    Recommendations for Other Services Rehab consult; OT consult  Frequency Min 3X/week    Precautions / Restrictions Precautions Precautions: Fall Precaution Comments: h/o parkinsons Restrictions Weight Bearing Restrictions: No   Pertinent Vitals/Pain C/o chronic back pain, assisted pt to reposition in bed      Mobility  Bed Mobility Bed Mobility: Supine to Sit;Sit to Supine;Scooting to HOB Supine to Sit: With rails;2: Max assist Sit to Supine: 2: Max assist Scooting to [facility]: 2: Max assist;Other (comment) (head in trendelenberg) Details for Bed Mobility Assistance: max facilitation and verbal sequencing  cues; pt needs increased time to process and initiate but requries max facilitation for follow through of movement Transfers Transfers: Not assessed Details for Transfer Assistance: not safe to attempt without assistance         PT Diagnosis: Difficulty walking;Abnormality of gait;Generalized weakness  PT Problem List: Decreased strength;Decreased activity tolerance;Decreased balance;Decreased mobility;Decreased coordination;Decreased cognition;Decreased knowledge of use of DME;Cardiopulmonary status limiting activity PT Treatment Interventions: DME instruction;Gait training;Stair training;Functional mobility training;Therapeutic exercise;Therapeutic activities;Balance training;Neuromuscular re-education;Patient/family education   PT Goals Acute Rehab PT Goals PT Goal Formulation: With patient/family Pt will Roll Supine to Right Side: with min assist PT Goal: Rolling Supine to Right Side - Progress: Goal set today Pt will Roll Supine to Left Side: with min assist PT Goal: Rolling Supine to Left Side - Progress: Goal set today Pt will go Supine/Side to Sit: with supervision PT Goal: Supine/Side to Sit - Progress: Goal set today Pt will go Sit to Supine/Side: with supervision PT Goal: Sit to Supine/Side - Progress: Goal set today Pt will go Sit to Stand: with supervision PT Goal: Sit to Stand - Progress: Goal set today Pt will go Stand to Sit: with supervision PT Goal: Stand to Sit - Progress: Goal set today Pt will Stand: with supervision PT Goal: Stand - Progress: Goal set today Pt will Ambulate: 1 - 15 feet;with min assist;with rolling walker PT Goal: Ambulate - Progress: Goal set today Pt will Perform Home Exercise Program: with supervision, verbal cues required/provided PT Goal: Perform Home Exercise Program - Progress: Goal set today  Visit Information  Last PT Received On: 07/02/12 Assistance Needed: +2  Subjective Data  Subjective: I need water.    Prior Functioning   Home Living Lives With: Spouse Available Help at Discharge: Family;Available 24 hours/day Type of Home: House Home Access: Ramped entrance Home Layout: Two level;Able to live on main level with bedroom/bathroom Bathroom Shower/Tub: Tub/shower unit Home Adaptive Equipment: Shower chair with back;Grab bars in shower Prior Function Level of Independence: Needs assistance Needs Assistance: Dressing Dressing: Minimal Driving: No Vocation: Retired Comments: uses a walker for short distances, otherwise uses w/c; pt was able to bathe himself once he got into the tub but wife had to help him get there Communication Communication:  (soft voice (pt's wife reports this is normal))    Cognition  Cognition Overall Cognitive Status: Impaired Area of Impairment: Problem solving;Attention Arousal/Alertness: Lethargic Orientation Level: Disoriented to;Time Behavior During Session: [facility] for tasks performed Current Attention Level: Sustained Cognition - Other Comments: wife didn't seem very concerned about his mentation at this point, she reports he is typically somewhat lethargic because of medications and that he hallucinates at times at baseline    Extremity/Trunk Assessment Right Upper Extremity Assessment RUE ROM/Strength/Tone: Deficits RUE ROM/Strength/Tone Deficits: grossly 3/5 Left Upper Extremity Assessment LUE ROM/Strength/Tone: Deficits LUE ROM/Strength/Tone Deficits: grossly 3/5 Right Lower Extremity Assessment RLE ROM/Strength/Tone: Deficits RLE ROM/Strength/Tone Deficits: grossly 3-4/5 RLE Coordination: Deficits RLE Coordination Deficits: bradykinetic Left Lower Extremity Assessment LLE ROM/Strength/Tone: Deficits LLE ROM/Strength/Tone Deficits: grossly 3-4/5 LLE Coordination: Deficits LLE Coordination Deficits: bradykinetic   Balance Balance Balance Assessed: Yes Static Sitting Balance Static Sitting - Balance Support: Bilateral upper extremity supported Static Sitting -  Level of Assistance: 4: Min assist;5: Stand by assistance Static Sitting - Comment/# of Minutes: sat EOB at least 5 minutes, pt with postero-lateral lean right needing faciliatory cues for midline body positioning and tall posture; attempted to get pt to scoot laterally EOB but pt unable to coordinate body segements to do so efficently  End of Session PT - End of Session Equipment Utilized During Treatment: Gait belt Activity Tolerance: Patient limited by fatigue Patient left: in bed;with call bell/phone within reach Nurse Communication: Mobility status  GP     [facility] HELEN 07/02/2012, 1:50 PM

## 2012-07-02 NOTE — H&P (View-Only) (Signed)
Patient Name: Jeremy Carr Date of Encounter: 07/02/2012   Principal Problem:   NSTEMI (non-ST elevated myocardial infarction) Active Problems:   HYPOTHYROIDISM   HYPERLIPIDEMIA   DISORDER, BIPOLAR NOS   PARKINSON'S DISEASE   HYPERTENSION   COPD   Acute respiratory failure with hypoxia    SUBJECTIVE Extubated yesterday, no acute events overnight; per RN, he is oriented to self and place, but otherwise confused yesterday, did not fall asleep until 4am; currently he denies CP/SOB  CURRENT MEDS . acetylcysteine  4 mL Nebulization Q4H  . albuterol  2.5 mg Nebulization Q4H  . amLODipine  10 mg Oral Daily  . ampicillin-sulbactam (UNASYN) IV  1.5 g Intravenous Q6H  . antiseptic oral rinse  15 mL Mouth Rinse QID  . aspirin  324 mg Oral Daily  . atorvastatin  80 mg Per Tube q1800  . benztropine  1 mg Oral TID  . carbidopa-levodopa  1 tablet Oral Custom  . chlorhexidine  15 mL Mouth Rinse BID  . clonazePAM  0.5 mg Oral TID  . lamoTRIgine  150 mg Oral Daily  . levothyroxine  100 mcg Per Tube QAC breakfast  . metoprolol tartrate  25 mg Oral BID  . pantoprazole (PROTONIX) IV  40 mg Intravenous QHS  . QUEtiapine  200 mg Oral Custom  . sertraline  200 mg Oral QHS  . sodium chloride  1,000 mL Intravenous Once    OBJECTIVE  Filed Vitals:   07/02/12 0400 07/02/12 0500 07/02/12 0600 07/02/12 0700  BP: 100/65 121/66 134/74 137/87  Pulse: 74 76 77 75  Temp: 98.3 F (36.8 C)     TempSrc: Axillary     Resp: 9 10 11 10   Height:      Weight:  215 lb 6.2 oz (97.7 kg)    SpO2: 94% 90% 91% 93%    Intake/Output Summary (Last 24 hours) at 07/02/12 0714 Last data filed at 07/02/12 0700  Gross per 24 hour  Intake 3563.55 ml  Output   1865 ml  Net 1698.55 ml   Filed Weights   06/30/12 1400 07/01/12 0500 07/02/12 0500  Weight: 211 lb 3.2 oz (95.8 kg) 214 lb 15.2 oz (97.5 kg) 215 lb 6.2 oz (97.7 kg)    PHYSICAL EXAM  General: NAD. Neuro: Arousable, sleepy but responds to  name HEENT:  Normal, extubated  Neck: Supple without bruits or JVD. Lungs:  Resp regular and unlabored, CTA anteriorly Heart: RRR no s3, s4, or murmurs. Abdomen: Soft, non-tender, non-distended, BS + x 4.  Extremities: No clubbing, cyanosis or edema. DP/PT/Radials 2+ and equal bilaterally.  Accessory Clinical Findings  CBC  Recent Labs  06/30/12 0841 07/02/12 0500  WBC 9.8 6.1  NEUTROABS 8.1*  --   HGB 12.3* 10.3*  HCT 37.3* 30.2*  MCV 91.4 89.1  PLT 230 183   Basic Metabolic Panel  Recent Labs  06/30/12 1103 06/30/12 1151  07/01/12 1030 07/01/12 2245  NA 142  --   < > 145 145  K 2.8*  --   < > 3.5 3.3*  CL 109  --   < > 116* 116*  CO2 18*  --   < > 16* 17*  GLUCOSE 143*  --   < > 88 99  BUN 37*  --   < > 31* 25*  CREATININE 2.09*  --   < > 1.42* 1.11  CALCIUM 8.6  --   < > 7.7* 7.8*  MG  --  2.5  --   --   --  PHOS  --  5.6*  --   --   --   < > = values in this interval not displayed. Liver Function Tests  Recent Labs  06/30/12 0841  AST 302*  ALT 22  ALKPHOS 143*  BILITOT 0.3  PROT 7.2  ALBUMIN 3.2*   Cardiac Enzymes  Recent Labs  07/01/12 0417 07/01/12 1030 07/01/12 1740 07/01/12 2254  CKTOTAL  --  4813*  --   --   CKMB  --  122.2*  --   --   TROPONINI 7.57* 6.36* 4.75* 4.50*   Hemoglobin A1C  Recent Labs  07/01/12 1030  HGBA1C 5.7*   Fasting Lipid Panel  Recent Labs  07/01/12 0650  CHOL 92  HDL 49  LDLCALC 32  TRIG 57  CHOLHDL 1.9   Thyroid Function Tests  Recent Labs  06/30/12 1103  TSH 3.621    TELE - sinus rhythm  Radiology/Studies  Ct Head Wo Contrast 06/30/2012   IMPRESSION:  1.  No acute intracranial findings. 2.  Atrophy and microvascular disease similar to prior. 3.  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    Portable Chest Xray 07/01/2012  IMPRESSION: Bibasilar air space disease with left lower lobe collapse/consolidation, and bilateral pleural effusions.     2D Echo  (07/01/12) Study Conclusions - Left ventricle: The cavity size was normal. Wall thicknesswas increased in a pattern of mild LVH. Systolic function was normal. The estimated ejection fraction was in the range of 60% to 65%. Wall motion was normal; there were no regional wall motion abnormalities. - Right atrium: The atrium was mildly dilated.   ASSESSMENT AND PLAN 67 yo M with history of HTN, HLD, TIA, Bipolar disorder and parkinson's disease who was found unresponsive by his wife at around 7am on the morning of admission (06/30/12).   #Acute respiratory failure: Extubated on 07/01/12; Unasyn Day 3 for ?Aspiration PNA  #NSTEMI: Initially thought to be STEMI at admission, but code STEMI cancelled, as ST changes thought to be secondary to hypothermia; troponin peaked at 7.57 and has trended down to 4.5. Echo with EF 60-65% without regional wall motion abnormalities. A1c 5.7, LDL 32 -ASA, statin -Increase metoprolol to 50mg  BID (HR 85-111) -continue heparin gtt  -Cath today as acute renal failure resolved  #Hypothermia: Resolved; with osborne waves at admission  #VF Cardiac Arrest: No further arrythmias; Responsive to defibrillation, likely secondary to hypothermia vs less likely acute coronary event    #Acute renal failure: Resolved; Cr = 2.39 at admission, and has improved to 1.11 (baseline around 1.2-1.3). Likely prerenal in setting of cardiac arrest & rhabdo. Appropriate urine output.   #HTN: Home regimen includes norvasc 10mg  daily; discontinue norvasc and manage BP with BB for now   #HLD: Atorvastatin 80mg  qHS   #Acute normocytic anemia: Hb 10.3 from 12.3 two days prior; likely dilutional and due to critical illness, as there is no obvious source of large blood loss (only hematuria on admission UA); continue to monitor  #Hypothyroidism: TSH wnl, continue home dose synthroid daily   #Bipolar disorder : on home klonopin, lamotrigine, sertraline and seroquel  #Parkinson's disease  : on home Benztropine and sinemet    Signed, Stacy Gardner MD, PGY2  Patient seen, examined. Available data reviewed. Agree with findings, assessment, and plan as outlined by Dr Everardo Beals. Exam shows an elderly gentleman in no distress. Heart is regular rate and rhythm without murmur. Lung fields are clear. There is mild peripheral edema present. The patient's  clinical history and lab and radiographic data was carefully reviewed. He has had significant cardiac enzyme elevation but no symptoms that are typical of acute coronary syndrome. I think it is elevated CK is clearly secondary to rhabdomyolysis. His left ventricular systolic function is normal and I think it is clear he has not had a large myocardial infarction. Considering his elevated troponin, he will undergo diagnostic cardiac catheterization and possible PCI. The risks, indications, and alternatives were reviewed with the patient and his wife in detail.  Tonny Bollman, M.D. 07/02/2012 11:49 AM

## 2012-07-02 NOTE — Progress Notes (Signed)
Hypokalemia   K replaced  

## 2012-07-02 NOTE — CV Procedure (Signed)
   Cardiac Catheterization Operative Report  Jeremy Carr [id number] 2/27/20144:16 PM Dwana Melena, MD  Procedure Performed:  1. Left Heart Catheterization 2. Selective Coronary Angiography 3. Left ventricular angiogram  Operator: Verne Carrow, MD  Indication:  67 yo male with h/o Parkinson's disease with admission after cardiac arrest 06/30/12. He was found down for several hours, initial temperature 84 degrees. Now extubated. Cardiac cath to exclude obstructive CAD.                                      Procedure Details: The risks, benefits, complications, treatment options, and expected outcomes were discussed with the patient. The patient and/or family concurred with the proposed plan, giving informed consent. The patient was brought to the cath lab after IV hydration was begun and oral premedication was given. The patient was not sedated. The right groin was prepped and draped in the usual manner. Using the modified Seldinger access technique, a 5 French sheath was placed in the right femoral artery. Standard diagnostic catheters were used to perform selective coronary angiography. A pigtail catheter was used to measure LV pressures.   There were no immediate complications. The patient was taken to the recovery area in stable condition.   Hemodynamic Findings: Central aortic pressure: 139/76 Left ventricular pressure: 139/10/21  Angiographic Findings:  Left main: No obstructive disease noted.   Left Anterior Descending Artery: Large caliber vessel that courses to the apex. The vessel becomes relatively small caliber in the distal segment. There is no obstructive disease noted. The diagonal branch is moderate in size and has no obstructive disease.   Circumflex Artery: Small to moderate sized non-dominant vessel with no obstructive disease.   Right Coronary Artery: Large dominant vessel with serial 25% stenoses in the mid vessel. No obstructive disease noted.   Left  Ventricular Angiogram: Deferred.   Impression: 1. Mild non-obstructive CAD  Recommendations: Continue medical management of CAD.        Complications:  None. The patient tolerated the procedure well.

## 2012-07-02 NOTE — Progress Notes (Signed)
Rehab Admissions Coordinator Note:  Patient was screened by Jeremy Carr for appropriateness for an Inpatient Acute Rehab Consult.  At this time, we are recommending Skilled Nursing Facility. [employer] medicare will not approve admission for his current diagnosis.  Jeremy Carr 07/02/2012, 1:55 PM  I can be reached at [phone].

## 2012-07-02 NOTE — Progress Notes (Signed)
PULMONARY  / CRITICAL CARE MEDICINE  Name: Jeremy Carr MRN: [id number] DOB: 09-06-1945    ADMISSION DATE:  06/30/2012 CONSULTATION DATE: 06/30/12  REFERRING MD : EDP  CHIEF COMPLAINT:AMS/ V-Fib Arrest   BRIEF PATIENT DESCRIPTION:  67 year old male with history of Parkinson's and Bipolar Disease with 3 day history of increased sleepiness, decreased appetite and psychosis. Slid down wall at home evening of 2/24, and after 4 hours of attempting to get patient up, he slept covered with blankets on the floor. Found unresponsive by wife this am with deep slow respirations. EMS transported to [facility] ED where patient was intubated and lined. Pt with GCS 3 and hypothermic upon arrival( 80 F) with v-fib arrest on 3 separate occasions that responded to defib.and ACLS drug treatment. PCCM asked to assume care.  SIGNIFICANT EVENTS / STUDIES:  2/25: Found down at home  2/25: Intubated in ED  LINES / TUBES: 2/25 RIJ cvc>>>  2/25 ETT>>>2/26  CULTURES: 2/25: Urine >>>ng 2/25: Blood>>> ng 2/25 sputum>> Urine strep ag neg  ANTIBIOTICS: 2/25 Unasyn per pharmacy >>>   SUBJECTIVE:   awake & alert this am Denies pain, dyspnea . VITAL SIGNS: Temp:  [98.1 F (36.7 C)-99.9 F (37.7 C)] 98.5 F (36.9 C) (02/27 0730) Pulse Rate:  [74-95] 75 (02/27 0700) Resp:  [7-24] 10 (02/27 0700) BP: (100-164)/(61-88) 137/87 mmHg (02/27 0700) SpO2:  [90 %-97 %] 94 % (02/27 0736) Arterial Line BP: (130-200)/(60-84) 200/84 mmHg (02/26 1800) FiO2 (%):  [40 %] 40 % (02/26 0922) Weight:  [97.7 kg (215 lb 6.2 oz)] 97.7 kg (215 lb 6.2 oz) (02/27 0500) HEMODYNAMICS: CVP:  [5 mmHg-11 mmHg] 7 mmHg VENTILATOR SETTINGS: Vent Mode:  [-] PSV;CPAP FiO2 (%):  [40 %] 40 % Set Rate:  [24 bmp] 24 bmp Vt Set:  [620 mL] 620 mL PEEP:  [5 cmH20] 5 cmH20 Pressure Support:  [5 cmH20] 5 cmH20 INTAKE / OUTPUT: Intake/Output     02/26 0701 - 02/27 0700 02/27 0701 - 02/28 0700   I.V. (mL/kg) 3263.6 (33.4)    IV Piggyback  300    Total Intake(mL/kg) 3563.6 (36.5)    Urine (mL/kg/hr) 1865 (0.8)    Total Output 1865     Net +1698.6            PHYSICAL EXAMINATION: General: supine in bed Neuro:  Follows commands, awake, alert, pupils 3 and sluggish HEENT: No LAN   Cardiovascular: Bradycardia with abnormal EKG   Lungs:   Coarse throughout Abdomen:   Soft and flat with diminished bowel sounds Musculoskeletal: cog wheel rigidity, no injuries Skin:   Fine petechial rash to lower extremities  LABS:  Recent Labs Lab 06/30/12 0841 06/30/12 0842  06/30/12 1119 06/30/12 1139 06/30/12 1151  07/01/12 0235 07/01/12 0411  07/01/12 1030 07/01/12 1740 07/01/12 2245 07/01/12 2254 07/02/12 0500  HGB 12.3*  --   --   --   --   --   --   --   --   --   --   --   --   --  10.3*  WBC 9.8  --   --   --   --   --   --   --   --   --   --   --   --   --  6.1  PLT 230  --   --   --   --   --   --   --   --   --   --   --   --   --  183  NA 140  --   < >  --   --   --   < > 142  --   --  145  --  145  --   --   K 3.6  --   < >  --   --   --   < > 3.6  --   --  3.5  --  3.3*  --   --   CL 103  --   < >  --   --   --   < > 112  --   --  116*  --  116*  --   --   CO2 17*  --   < >  --   --   --   < > 15*  --   --  16*  --  17*  --   --   GLUCOSE 199*  --   < >  --   --   --   < > 87  --   --  88  --  99  --   --   BUN 39*  --   < >  --   --   --   < > 34*  --   --  31*  --  25*  --   --   CREATININE 2.39*  --   < >  --   --   --   < > 1.53*  --   --  1.42*  --  1.11  --   --   CALCIUM 8.7  --   < >  --   --   --   < > 7.9*  --   --  7.7*  --  7.8*  --   --   MG  --   --   --   --   --  2.5  --   --   --   --   --   --   --   --   --   PHOS  --   --   --   --   --  5.6*  --   --   --   --   --   --   --   --   --   AST 302*  --   --   --   --   --   --   --   --   --   --   --   --   --   --   ALT 22  --   --   --   --   --   --   --   --   --   --   --   --   --   --   ALKPHOS 143*  --   --   --   --   --   --    --   --   --   --   --   --   --   --   BILITOT 0.3  --   --   --   --   --   --   --   --   --   --   --   --   --   --   PROT 7.2  --   --   --   --   --   --   --   --   --   --   --   --   --   --  ALBUMIN 3.2*  --   --   --   --   --   --   --   --   --   --   --   --   --   --   APTT 30  --   --   --   --   --   --   --   --   --   --   --   --   --   --   INR 1.40  --   --   --   --   --   --   --   --   --   --   --   --   --   --   LATICACIDVEN  --  4.32*  --   --  1.7  --   --   --   --   --   --   --   --   --   --   TROPONINI  --  0.31*  --   --   --   --   < >  --   --   < > 6.36* 4.75*  --  4.50*  --   PROBNP  --  235.7*  --   --   --   --   --   --   --   --   --   --   --   --   --   PHART  --   --   --  7.477*  --   --   --   --  7.292*  --   --   --   --   --   --   PCO2ART  --   --   --  21.0*  --   --   --   --  31.7*  --   --   --   --   --   --   PO2ART  --   --   --  51.0*  --   --   --   --  83.4  --   --   --   --   --   --   < > = values in this interval not displayed.  Recent Labs Lab 07/01/12 1520 07/01/12 2106 07/01/12 2351 07/02/12 0323 07/02/12 0734  GLUCAP 80 95 99 98 97    Imaging: Ct Head Wo Contrast  06/30/2012  *RADIOLOGY REPORT*  Clinical Data: Unresponsive  CT HEAD WITHOUT CONTRAST  Technique:  Contiguous axial images were obtained from the base of the skull through the vertex without contrast.  Comparison: Brain MRI 08/06/2010, head CT 08/03/2010  Findings: The patient is tilted in the scanner. No acute intracranial hemorrhage.  No focal mass lesion.  No CT evidence of acute infarction.   No midline shift or mass effect.  No hydrocephalus.  Basilar cisterns are patent.  There is generalized cortical atrophy similar to prior.  There is mild periventricular and subcortical white matter hypodensities also similar to prior. Small 5 mm hypodensity in the left basal ganglia (image 17) is  not clearly seen on prior.  Paranasal sinuses and mastoid air cells  are clear.  Orbits are normal.  IMPRESSION:  1.  No acute intracranial findings. 2.  Atrophy and microvascular disease similar to prior. 3.  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   Original Report Authenticated  By: Genevive Bi, M.D.    Portable Chest Xray In Am  07/01/2012  *RADIOLOGY REPORT*  Clinical Data: Endotracheal tube, shortness of breath.  PORTABLE CHEST - 1 VIEW  Comparison: 06/30/2012.  Findings: Endotracheal tube is in satisfactory position. Nasogastric tube is followed into the stomach with the tip projecting beyond the inferior boundary of the film.  Right IJ central line tip projects over the SVC.  Heart size stable.  There is bibasilar air space disease with collapse/consolidation in the left lower lobe.  Bilateral pleural effusions persist.  IMPRESSION: Bibasilar air space disease with left lower lobe collapse/consolidation, and bilateral pleural effusions.   Original Report Authenticated By: Leanna Battles, M.D.    Dg Chest Port 1 View  06/30/2012  *RADIOLOGY REPORT*  Clinical Data: Chest pain  PORTABLE CHEST - 1 VIEW  Comparison: 08/03/2010  Findings: The endotracheal tube tip is above the carina.  There is a right IJ catheter with the tip in the right atrium.  Heart size is mildly enlarged.  There are bilateral pleural effusions and mild interstitial edema consistent with CHF.  Atelectasis noted within both lung bases.  IMPRESSION:  1.  Satisfactory position of ET tube with tip above the carina. 2.  The right IJ catheter tip is in the right atrium.  No pneumothorax identified. 3.  Pleural effusions and pulmonary edema consistent with CHF.   Original Report Authenticated By: Signa Kell, M.D.       ASSESSMENT / PLAN: Unclear cause of hypothermia, nstemi, transient hypotension  PULMONARY A:VDRF  In setting of AMS and cardiac arrest Pulmonary edema  COPD   P: Extubated PT consult   CARDIOVASCULAR A: Shock Post V-Fib arrest NSTEMI - peak tp  7.5 ST changes mimicking MI in setting of hypothermia CHF Prolonged QT P:  Cardiology following Metoprolol, cath eventually  RENAL   Recent Labs Lab 06/30/12 1103 06/30/12 1835 07/01/12 0235 07/01/12 1030 07/01/12 2245  CREATININE 2.09* 1.74* 1.53* 1.42* 1.11     A:  ARF -resolving Mild rhabdo - peak CK 4800 P:   Gentle volume repletion 50/h x another 24h in anticipation of contrast then stop   GASTROINTESTINAL A: no issues P:resume po   HEMATOLOGIC A: Anemia  P: Monitor CBC  scd  INFECTIOUS A: hypothermia ? Cause ? sepsis R/O Aspiration event P:   UNasyn bc sputum  ENDOCRINE A: Hypothyroidism -TSH 3.6, cortisol 28 P: Continue synthroid Monitor CBG's    NEUROLOGIC A:  AMS unknown etiology(unknown down time) Parkinsons Disease BiPolar Disease CT Head neg P:   continue Parkinsonian medications if able to give oral resume seroquel, watch qT No haldol etc   GLOBAL - mobilise, transfer to SDU & hospitalist/cards service    I have personally obtained a history, examined the patient, evaluated laboratory and imaging results, formulated the assessment and plan and placed orders. CRITICAL CARE: The patient is critically ill with multiple organ systems failure and requires high complexity decision making for assessment and support, frequent evaluation and titration of therapies, application of advanced monitoring technologies and extensive interpretation of multiple databases. Critical Care Time devoted to patient care services described in this note is 35 minutes.   ALVA,RAKESH V.  [phone]

## 2012-07-03 ENCOUNTER — Inpatient Hospital Stay (HOSPITAL_COMMUNITY): Payer: Medicare Other

## 2012-07-03 DIAGNOSIS — R748 Abnormal levels of other serum enzymes: Secondary | ICD-10-CM

## 2012-07-03 LAB — CBC
HCT: 32 % — ABNORMAL LOW (ref 39.0–52.0)
Hemoglobin: 10.8 g/dL — ABNORMAL LOW (ref 13.0–17.0)
MCH: 30.2 pg (ref 26.0–34.0)
MCHC: 33.8 g/dL (ref 30.0–36.0)
MCV: 89.4 fL (ref 78.0–100.0)
RBC: 3.58 MIL/uL — ABNORMAL LOW (ref 4.22–5.81)

## 2012-07-03 LAB — BASIC METABOLIC PANEL
BUN: 18 mg/dL (ref 6–23)
CO2: 20 mEq/L (ref 19–32)
Calcium: 8.3 mg/dL — ABNORMAL LOW (ref 8.4–10.5)
Creatinine, Ser: 0.94 mg/dL (ref 0.50–1.35)
GFR calc non Af Amer: 84 mL/min — ABNORMAL LOW (ref >90)
Glucose, Bld: 98 mg/dL (ref 70–99)
Sodium: 146 mEq/L — ABNORMAL HIGH (ref 135–145)

## 2012-07-03 LAB — GLUCOSE, CAPILLARY
Glucose-Capillary: 82 mg/dL (ref 70–99)
Glucose-Capillary: 112 mg/dL — ABNORMAL HIGH (ref 70–99)
Glucose-Capillary: 99 mg/dL (ref 70–99)

## 2012-07-03 MED ORDER — IOHEXOL 350 MG/ML SOLN
60.0000 mL | Freq: Once | INTRAVENOUS | Status: AC | PRN
  Administered 2012-07-03: 65 mL via INTRAVENOUS

## 2012-07-03 MED ORDER — ENOXAPARIN SODIUM 40 MG/0.4ML ~~LOC~~ SOLN
40.0000 mg | SUBCUTANEOUS | Status: DC
  Administered 2012-07-03 – 2012-07-05 (×3): 40 mg via SUBCUTANEOUS
  Filled 2012-07-03 (×4): qty 0.4

## 2012-07-03 MED ORDER — ENSURE PUDDING PO PUDG
1.0000 | Freq: Three times a day (TID) | ORAL | Status: DC
  Administered 2012-07-03 – 2012-07-06 (×10): 1 via ORAL

## 2012-07-03 MED ORDER — FUROSEMIDE 10 MG/ML IJ SOLN
40.0000 mg | Freq: Two times a day (BID) | INTRAMUSCULAR | Status: AC
  Administered 2012-07-03 – 2012-07-04 (×2): 40 mg via INTRAVENOUS
  Filled 2012-07-03 (×2): qty 4

## 2012-07-03 MED ORDER — METOPROLOL TARTRATE 50 MG PO TABS
75.0000 mg | ORAL_TABLET | Freq: Two times a day (BID) | ORAL | Status: DC
  Administered 2012-07-03: 75 mg via ORAL
  Filled 2012-07-03 (×3): qty 1

## 2012-07-03 NOTE — Progress Notes (Addendum)
[facility] PROGRESS NOTE  Jeremy Carr YNW:[id number] DOB: August 11, 1945 DOA: 06/30/2012 PCP: Dwana Melena, MD  Brief narrative: This is a 67 year old male with Parkinson's disease and bipolar disorder who slipped down to the floor at home-his wife was unable to get him up but covered him with blankets and allowed him to lay on the floor overnight-he developed hypothermia while laying on the floor at home overnight despite apparently being adequately covered with blankets by his wife. Glasgow Coma Scale on arrival was 3. Patient had V. fib arrest on 3 separate occasions that responded to defibrillation and ACLS drug treatment. Due to poor responsiveness, he was intubated in the ER and was admitted to the ICU. He was noted to have elevated cardiac enzymes and ST depressions which were suspected to be secondary to the hypothermia rather than an acute MI. Patient was able to be extubated on 2/26. It is also noted per history that the patient had increasing confusion over the past 3-4 days prior to being admitted and was due to have a followup psychiatric eval.  Assessment/Plan: Principal Problem:   Acute respiratory failure with hypoxia -Likely initially due to hypothermia and obtunded state -Has been on Unasyn for possibility of aspiration pneumonia with a chest x-ray revealing infiltrate at the bases but more in the left lower lobe versus collapse of the left lower lobe -Not yet completely resolved as he remains on 40% -Ventimask and desaturates with movement -Will obtain a CT of his chest to further investigate his hypoxia   Active Problems:    NSTEMI (non-ST elevated myocardial infarction) -Status post cath on 2/27 which reveals mild nonobstructive coronary artery disease -Continue aspirin statin and metoprolol per cardiology   V. fib cardiac arrest -Echo reveals an EF of 60-65% without any regional wall motion abnormalities -Rhythm has remained stable  Acute renal  failure -Creatinine was elevated at 2.39 on admission and has completely normalized -Suspect it was related to poor renal perfusion in setting of hypothermia  Parkinson's disease Continue home medications and start PT  Bipolar disorder Continue home medications  COPD -Despite his current hypoxic respiratory failure, COPD appears to be stable -The patient does not use oxygen at home  Hypertension BP is noted to be elevated despite metoprolol and Norvasc It is noted that patient only takes Norvasc at home Will increase Lopressor to 75 mg twice a day  Hypothyroidism Continue Synthroid  Anemia Previous hemoglobins have been in the 12-13 range Will obtain an anemia profile and stool Hemoccults  Code Status: Full code  Family Communication: None Disposition Plan: Follow in step down the  Consultants:  Cardiology  Procedures:  Cardiac cath 2/27  Antibiotics:  Unasyn 2/25>>  DVT Prophylaxis:  Lovenox   HPI/Subjective: The patient is alert and sitting up in bed-quite talkative-tells me he does not have to use oxygen at home and is not usually short of breath-no other complaints-would like to get out of bed  Objective: Filed Vitals:   07/03/12 1119 07/03/12 1153 07/03/12 1200 07/03/12 1400  BP:   150/80 147/73  Pulse:   72 73  Temp: 99 F (37.2 C)     TempSrc: Axillary     Resp:   17 15  Height:      Weight:      SpO2:  91% 94% 91%    Intake/Output Summary (Last 24 hours) at 07/03/12 1454 Last data filed at 07/03/12 1323  Gross per 24 hour  Intake   1780 ml  Output  1475 ml  Net    305 ml    Exam:   General:  Awake alert oriented x3, no acute respiratory distress   Cardiovascular: Regular rate and rhythm no murmurs rubs or gallops  Respiratory: Mild rhonchi bilaterally, no wheezing, no crackles  Abdomen: Soft nontender nondistended bowel sounds positive  Ext: No cyanosis clubbing or edema  Data Reviewed: Basic Metabolic Panel:  Recent  Labs Lab 06/30/12 1103 06/30/12 1151  07/01/12 0235 07/01/12 1030 07/01/12 2245 07/02/12 1026 07/03/12 0430  NA 142  --   < > 142 145 145 146* 146*  K 2.8*  --   < > 3.6 3.5 3.3* 3.3* 3.5  CL 109  --   < > 112 116* 116* 115* 117*  CO2 18*  --   < > 15* 16* 17* 20 20  GLUCOSE 143*  --   < > 87 88 99 113* 98  BUN 37*  --   < > 34* 31* 25* 21 18  CREATININE 2.09*  --   < > 1.53* 1.42* 1.11 1.02 0.94  CALCIUM 8.6  --   < > 7.9* 7.7* 7.8* 8.0* 8.3*  MG  --  2.5  --   --   --   --   --   --   PHOS  --  5.6*  --   --   --   --   --   --   < > = values in this interval not displayed. Liver Function Tests:  Recent Labs Lab 06/30/12 0841  AST 302*  ALT 22  ALKPHOS 143*  BILITOT 0.3  PROT 7.2  ALBUMIN 3.2*   No results found for this basename: LIPASE, AMYLASE,  in the last 168 hours No results found for this basename: AMMONIA,  in the last 168 hours CBC:  Recent Labs Lab 06/30/12 0841 07/02/12 0500 07/03/12 0430  WBC 9.8 6.1 7.9  NEUTROABS 8.1*  --   --   HGB 12.3* 10.3* 10.8*  HCT 37.3* 30.2* 32.0*  MCV 91.4 89.1 89.4  PLT 230 183 187   Cardiac Enzymes:  Recent Labs Lab 06/30/12 2200 07/01/12 0417 07/01/12 1030 07/01/12 1740 07/01/12 2254  CKTOTAL  --   --  4813*  --   --   CKMB  --   --  122.2*  --   --   TROPONINI 6.79* 7.57* 6.36* 4.75* 4.50*   BNP (last 3 results)  Recent Labs  06/30/12 0842  PROBNP 235.7*   CBG:  Recent Labs Lab 07/02/12 2027 07/02/12 2354 07/03/12 0400 07/03/12 0719 07/03/12 1115  GLUCAP 96 95 82 112* 115*       Studies: Reviewed in detail  Scheduled Meds: . albuterol  2.5 mg Nebulization Q4H  . amLODipine  10 mg Oral Daily  . ampicillin-sulbactam (UNASYN) IV  3 g Intravenous Q6H  . antiseptic oral rinse  15 mL Mouth Rinse QID  . aspirin  324 mg Oral Daily  . atorvastatin  80 mg Per Tube q1800  . benztropine  1 mg Oral TID  . carbidopa-levodopa  1 tablet Oral Custom  . clonazePAM  0.5 mg Oral TID  . feeding  supplement  1 Container Oral TID BM  . lamoTRIgine  150 mg Oral Daily  . levothyroxine  100 mcg Per Tube QAC breakfast  . metoprolol tartrate  50 mg Oral BID  . QUEtiapine  200 mg Oral Custom  . sertraline  200 mg Oral QHS  . sodium chloride  1,000 mL Intravenous Once   Continuous Infusions: . sodium chloride    . sodium chloride 10 mL/hr at 07/01/12 1509    ________________________________________________________________________  Time spent in minutes: 40    [facility]  [facility] Pager [phone] If 8PM-8AM, please contact night-coverage at [email or web address], password [facility] 07/03/2012, 2:54 PM  LOS: 3 days

## 2012-07-03 NOTE — Progress Notes (Signed)
INITIAL NUTRITION ASSESSMENT  DOCUMENTATION CODES Per approved criteria  -Not Applicable   INTERVENTION: 1. Ensure Pudding po TID, each supplement provides 170 kcal and 4 grams of protein.   2. RD will continue to follow    NUTRITION DIAGNOSIS: Predicted sub-optimal oral intake related to hx of parkinson's as evidenced by requires assistance with meals.   Goal: PO intake to meet >/=90% estimated nutrition needs.   Monitor:  PO intake, weight trends, wound healing, labs   Reason for Assessment: RN verbal consult   67 y.o. male  Admitting Dx: NSTEMI (non-ST elevated myocardial infarction)  ASSESSMENT: Pt admitted after being found down at home, intubated in ED and now extubated with diet advanced to D3. Planned for cardiac cath at some point this admission. ARF resolving.   RN reports that pt with limitations to self feeding and likely will not meet nutrition needs. Pt with hx of Parkinson's, decreased appetite x3 days PTA.   Wife reports normally a very good appetite. Does have some problems swallowing in the past, hx of eso dilation about 4 years ago. Encouraged increased protein with current wounds to promote healing. Wife agreeable to Ensure Pudding TID.   Height: Ht Readings from Last 1 Encounters:  06/30/12 6' (1.829 m)    Weight: Wt Readings from Last 1 Encounters:  07/03/12 217 lb 2.5 oz (98.5 kg)    Ideal Body Weight: 81 kg  % Ideal Body Weight: 121%  Wt Readings from Last 10 Encounters:  07/03/12 217 lb 2.5 oz (98.5 kg)  07/03/12 217 lb 2.5 oz (98.5 kg)  05/21/12 214 lb 6.4 oz (97.251 kg)  01/13/09 209 lb (94.802 kg)  12/16/08 209 lb (94.802 kg)  11/04/08 211 lb (95.709 kg)  10/07/08 212 lb (96.163 kg)  09/05/08 213 lb (96.616 kg)  08/22/08 210 lb (95.255 kg)  08/15/08 212 lb (96.163 kg)    Usual Body Weight: 212 lbs   % Usual Body Weight: 102%  BMI:  Body mass index is 29.44 kg/(m^2). Overweight   Estimated Nutritional Needs: Kcal:  2100-2300 Protein: 80-95 gm  Fluid: >/=2.1 L  Skin: stage I pressure ulcers to buttocks, bilateral heels, Deep tissue injury on back.    Diet Order: Dysphagia 3, this   EDUCATION NEEDS: -No education needs identified at this time   Intake/Output Summary (Last 24 hours) at 07/03/12 1004 Last data filed at 07/03/12 0547  Gross per 24 hour  Intake   2118 ml  Output   2475 ml  Net   -357 ml    Last BM: PTA   Labs:   Recent Labs Lab 06/30/12 1103 06/30/12 1151  07/01/12 2245 07/02/12 1026 07/03/12 0430  NA 142  --   < > 145 146* 146*  K 2.8*  --   < > 3.3* 3.3* 3.5  CL 109  --   < > 116* 115* 117*  CO2 18*  --   < > 17* 20 20  BUN 37*  --   < > 25* 21 18  CREATININE 2.09*  --   < > 1.11 1.02 0.94  CALCIUM 8.6  --   < > 7.8* 8.0* 8.3*  MG  --  2.5  --   --   --   --   PHOS  --  5.6*  --   --   --   --   GLUCOSE 143*  --   < > 99 113* 98  < > = values in this interval not  displayed.  CBG (last 3)   Recent Labs  07/02/12 2354 07/03/12 0400 07/03/12 0719  GLUCAP 95 82 112*    Scheduled Meds: . albuterol  2.5 mg Nebulization Q4H  . amLODipine  10 mg Oral Daily  . ampicillin-sulbactam (UNASYN) IV  3 g Intravenous Q6H  . antiseptic oral rinse  15 mL Mouth Rinse QID  . aspirin  324 mg Oral Daily  . atorvastatin  80 mg Per Tube q1800  . benztropine  1 mg Oral TID  . carbidopa-levodopa  1 tablet Oral Custom  . clonazePAM  0.5 mg Oral TID  . lamoTRIgine  150 mg Oral Daily  . levothyroxine  100 mcg Per Tube QAC breakfast  . metoprolol tartrate  50 mg Oral BID  . QUEtiapine  200 mg Oral Custom  . sertraline  200 mg Oral QHS  . sodium chloride  1,000 mL Intravenous Once    Continuous Infusions: . sodium chloride    . sodium chloride 10 mL/hr at 07/01/12 1509    Past Medical History  Diagnosis Date  . Parkinson disease   . Bipolar 1 disorder   . HTN (hypertension)   . Hyperlipemia   . Stroke   . COPD (chronic obstructive pulmonary disease)   .  Arthritis   . Anxiety     Past Surgical History  Procedure Laterality Date  . Colonoscopy  05/15/2006    Normal rectum/Long tortuous colon/Normal terminal ileum/ Polyps in the left and right colon resected as described above  . Esophagogastroduodenoscopy  02/05/2006    Normal stomach, normal D-I and D-II/ Four-quadrant distal esophageal erosions, consistent with moderately severe erosive reflux esophagitis, otherwise normal tubular esophagus  . Esophagogastroduodenoscopy  09/06/2009    normal esophagus/small hiatal hernia  . Colonoscopy  09/06/2009    normal rectum  . Esophageal dilation      ready for 3rd time    Clarene Duke RD, LDN Pager [phone] After Hours pager [phone]

## 2012-07-03 NOTE — Progress Notes (Signed)
Patient Name: Jeremy Carr Date of Encounter: 07/03/2012   Principal Problem:   NSTEMI (non-ST elevated myocardial infarction) Active Problems:   HYPOTHYROIDISM   HYPERLIPIDEMIA   DISORDER, BIPOLAR NOS   PARKINSON'S DISEASE   HYPERTENSION   COPD   Acute respiratory failure with hypoxia    SUBJECTIVE Patient denies CP or dyspnea  CURRENT MEDS . albuterol  2.5 mg Nebulization Q4H  . amLODipine  10 mg Oral Daily  . ampicillin-sulbactam (UNASYN) IV  3 g Intravenous Q6H  . antiseptic oral rinse  15 mL Mouth Rinse QID  . aspirin  324 mg Oral Daily  . atorvastatin  80 mg Per Tube q1800  . benztropine  1 mg Oral TID  . carbidopa-levodopa  1 tablet Oral Custom  . clonazePAM  0.5 mg Oral TID  . lamoTRIgine  150 mg Oral Daily  . levothyroxine  100 mcg Per Tube QAC breakfast  . metoprolol tartrate  50 mg Oral BID  . QUEtiapine  200 mg Oral Custom  . sertraline  200 mg Oral QHS  . sodium chloride  1,000 mL Intravenous Once    OBJECTIVE  Filed Vitals:   07/03/12 0400 07/03/12 0433 07/03/12 0500 07/03/12 0600  BP: 170/94  154/84 159/94  Pulse: 76  78 79  Temp: 98 F (36.7 C)     TempSrc: Oral     Resp: 17  15 13   Height:      Weight: 217 lb 2.5 oz (98.5 kg)     SpO2: 93% 92% 91% 92%    Intake/Output Summary (Last 24 hours) at 07/03/12 0707 Last data filed at 07/03/12 0547  Gross per 24 hour  Intake 2651.5 ml  Output   2475 ml  Net  176.5 ml   Filed Weights   07/01/12 0500 07/02/12 0500 07/03/12 0400  Weight: 214 lb 15.2 oz (97.5 kg) 215 lb 6.2 oz (97.7 kg) 217 lb 2.5 oz (98.5 kg)    PHYSICAL EXAM  General: NAD. HEENT:  Normal Neck: Supple  Lungs:  CTA anteriorly Heart: RRR  Abdomen: Soft, non-tender, non-distended  Extremities: No edema. Right groin with no hematoma and no bruit  Accessory Clinical Findings  CBC  Recent Labs  06/30/12 0841 07/02/12 0500 07/03/12 0430  WBC 9.8 6.1 7.9  NEUTROABS 8.1*  --   --   HGB 12.3* 10.3* 10.8*  HCT  37.3* 30.2* 32.0*  MCV 91.4 89.1 89.4  PLT 230 183 187   Basic Metabolic Panel  Recent Labs  06/30/12 1103 06/30/12 1151  07/02/12 1026 07/03/12 0430  NA 142  --   < > 146* 146*  K 2.8*  --   < > 3.3* 3.5  CL 109  --   < > 115* 117*  CO2 18*  --   < > 20 20  GLUCOSE 143*  --   < > 113* 98  BUN 37*  --   < > 21 18  CREATININE 2.09*  --   < > 1.02 0.94  CALCIUM 8.6  --   < > 8.0* 8.3*  MG  --  2.5  --   --   --   PHOS  --  5.6*  --   --   --   < > = values in this interval not displayed. Liver Function Tests  Recent Labs  06/30/12 0841  AST 302*  ALT 22  ALKPHOS 143*  BILITOT 0.3  PROT 7.2  ALBUMIN 3.2*   Cardiac Enzymes  Recent Labs  07/01/12 0417 07/01/12 1030 07/01/12 1740 07/01/12 2254  CKTOTAL  --  4813*  --   --   CKMB  --  122.2*  --   --   TROPONINI 7.57* 6.36* 4.75* 4.50*   Hemoglobin A1C  Recent Labs  07/01/12 1030  HGBA1C 5.7*   Fasting Lipid Panel  Recent Labs  07/01/12 0650  CHOL 92  HDL 49  LDLCALC 32  TRIG 57  CHOLHDL 1.9   Thyroid Function Tests  Recent Labs  06/30/12 1103  TSH 3.621    Radiology/Studies  Ct Head Wo Contrast 06/30/2012   IMPRESSION:  1.  No acute intracranial findings. 2.  Atrophy and microvascular disease similar to prior. 3.  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    Portable Chest Xray 07/01/2012  IMPRESSION: Bibasilar air space disease with left lower lobe collapse/consolidation, and bilateral pleural effusions.     2D Echo (07/01/12) Study Conclusions - Left ventricle: The cavity size was normal. Wall thicknesswas increased in a pattern of mild LVH. Systolic function was normal. The estimated ejection fraction was in the range of 60% to 65%. Wall motion was normal; there were no regional wall motion abnormalities. - Right atrium: The atrium was mildly dilated.   ASSESSMENT AND PLAN 67 yo M with history of HTN, HLD, TIA, Bipolar disorder and parkinson's disease who was  found unresponsive by his wife at around 7am on the morning of admission (06/30/12).   #Acute respiratory failure: Extubated on 07/01/12; Unasyn for ?Aspiration PNA  #NSTEMI: Initially thought to be STEMI at admission, but code STEMI cancelled, as ST changes thought to be secondary to hypothermia and osborne waves; troponin peaked at 7.57 and has trended down to 4.5. Echo with EF 60-65% without regional wall motion abnormalities.  -ASA, statin, metoprolol -Cath shows no obstructive CAD; continue medical therapy and fu Dr Dietrich Pates. Would add DVT prophylaxis heparin   #Hypothermia: Resolved  #VF Cardiac Arrest: No further arrythmias; Responsive to defibrillation, likely secondary to hypothermia  #Acute renal failure: Resolved  #HTN: Monitor; continue present meds   #HLD: Atorvastatin 80mg  qHS   #Parkinson's disease : on home Benztropine and sinemet  We will sign off; patient should fu with Dr Dietrich Pates after DC  Signed, Olga Millers MD

## 2012-07-03 NOTE — Progress Notes (Signed)
Clinical Social Work Department BRIEF PSYCHOSOCIAL ASSESSMENT 07/03/2012  Patient:  Jeremy Carr, Jeremy Carr     Account Number:  [id number]     Admit date:  06/30/2012  Clinical Social Worker:  Dennison Bulla  Date/Time:  07/03/2012 10:00 AM  Referred by:  Physician  Date Referred:  07/03/2012 Referred for  SNF Placement   Other Referral:   Interview type:  Patient Other interview type:    PSYCHOSOCIAL DATA Living Status:  FAMILY Admitted from facility:   Level of care:   Primary support name:  Pat Primary support relationship to patient:  SPOUSE Degree of support available:   Strong    CURRENT CONCERNS Current Concerns  Post-Acute Placement   Other Concerns:    SOCIAL WORK ASSESSMENT / PLAN CSW received referral to assist with placement. PT recommends CIR but insurance will not approve. CSW met with patient and family at bedside.    CSW introduced myself and explained role. Patient was interacting with RN and eating during majority of assessment but agreeable to wife making decisions. CSW spoke with wife regarding patient's mobility PTA. Patient was able to bathe himself but often required assistance. Wife reports that PT needed 2 therapists yesterday and that patient is "a big guy to have to move around". CSW spoke with wife regarding SNF placement. Wife was unfamiliar with SNF so CSW provided information and SNF list. CSW explained insurance and copays for SNF and that Medicare only covers ST stays. CSW encouraged wife to talk with Department of Social Services regarding Medicaid if she felt patient needs LT placement. Wife is aware of DSS and agreeable to follow up with Medicaid.    CSW completed FL2 and pasarr and faxed out to [facility]. CSW will follow up with bed offers.   Assessment/plan status:  Psychosocial Support/Ongoing Assessment of Needs Other assessment/ plan:   Information/referral to community resources:   SNF list    PATIENT'S/FAMILY'S RESPONSE TO  PLAN OF CARE: Patient alert but confused. Wife agreeable to assessment and engaged throughout session. Wife agreeable to SNF placement and agreeable to CSW follow up.

## 2012-07-03 NOTE — Progress Notes (Signed)
Clinical Social Work  CSW reviewed chart which stated that PT recommends CIR but insurance will not approve. CSW met with patient at bedside to discuss SNF options. Patient asks that CSW speak with wife. CSW left SNF list in room and called wife and left a message with CSW contact information. CSW will continue to follow.  [place], [place] [phone]

## 2012-07-03 NOTE — Progress Notes (Addendum)
Clinical Social Work Department CLINICAL SOCIAL WORK PLACEMENT NOTE 07/03/2012  Patient:  Jeremy Carr, Jeremy Carr  Account Number:  [id number] Admit date:  06/30/2012  Clinical Social Worker:  Unk Lightning, LCSW  Date/time:  07/03/2012 10:00 AM  Clinical Social Work is seeking post-discharge placement for this patient at the following level of care:   SKILLED NURSING   (*CSW will update this form in Epic as items are completed)   07/03/2012  Patient/family provided with [facility] Department of Clinical Social Work's list of facilities offering this level of care within the geographic area requested by the patient (or if unable, by the patient's family).  07/03/2012  Patient/family informed of their freedom to choose among providers that offer the needed level of care, that participate in Medicare, Medicaid or managed care program needed by the patient, have an available bed and are willing to accept the patient.  07/03/2012  Patient/family informed of [facility]' ownership interest in [facility], as well as of the fact that they are under no obligation to receive care at this facility.  PASARR submitted to EDS on 07/03/2012 PASARR number received from [employer]DS on 07/03/2012  FL2 transmitted to all facilities in geographic area requested by pt/family on  07/03/2012 FL2 transmitted to all facilities within larger geographic area on   Patient informed that his/her managed care company has contracts with or will negotiate with  certain facilities, including the following:     Patient/family informed of bed offers received:  07/05/2012 Patient chooses bed at [facility] Physician recommends and patient chooses bed at  N/A  Patient to be transferred to [facility] [place] on 07/06/2012  Patient to be transferred to facility by [facility]  The following physician request were entered in Epic:   Additional Comments: Wife's top choices are [facility] and [facility], I text paged  facilities to let them know. Wife and her sister are going today to visit [facility], Sunday 07/05/2012, to assist in making a decision if [facility] or [facility] cannot accept patient.  Ricke Hey, [place] [phone] (weekend)

## 2012-07-04 DIAGNOSIS — J810 Acute pulmonary edema: Secondary | ICD-10-CM | POA: Diagnosis not present

## 2012-07-04 LAB — GLUCOSE, CAPILLARY
Glucose-Capillary: 109 mg/dL — ABNORMAL HIGH (ref 70–99)
Glucose-Capillary: 112 mg/dL — ABNORMAL HIGH (ref 70–99)
Glucose-Capillary: 96 mg/dL (ref 70–99)
Glucose-Capillary: 118 mg/dL — ABNORMAL HIGH (ref 70–99)

## 2012-07-04 LAB — BASIC METABOLIC PANEL
CO2: 27 mEq/L (ref 19–32)
Calcium: 9 mg/dL (ref 8.4–10.5)
GFR calc non Af Amer: 86 mL/min — ABNORMAL LOW (ref >90)
Glucose, Bld: 99 mg/dL (ref 70–99)
Potassium: 3.1 mEq/L — ABNORMAL LOW (ref 3.5–5.1)
Sodium: 146 mEq/L — ABNORMAL HIGH (ref 135–145)

## 2012-07-04 LAB — CBC
Hemoglobin: 11.7 g/dL — ABNORMAL LOW (ref 13.0–17.0)
MCH: 30 pg (ref 26.0–34.0)
Platelets: 213 10*3/uL (ref 150–400)
RBC: 3.9 MIL/uL — ABNORMAL LOW (ref 4.22–5.81)

## 2012-07-04 MED ORDER — CLONAZEPAM 0.5 MG PO TABS
0.5000 mg | ORAL_TABLET | Freq: Every day | ORAL | Status: DC
  Administered 2012-07-04 – 2012-07-05 (×2): 0.5 mg via ORAL
  Filled 2012-07-04: qty 1

## 2012-07-04 MED ORDER — CLONAZEPAM 0.5 MG PO TABS
0.5000 mg | ORAL_TABLET | Freq: Three times a day (TID) | ORAL | Status: DC | PRN
  Administered 2012-07-04: 0.5 mg via ORAL
  Filled 2012-07-04 (×2): qty 1

## 2012-07-04 MED ORDER — FUROSEMIDE 10 MG/ML IJ SOLN
40.0000 mg | Freq: Two times a day (BID) | INTRAMUSCULAR | Status: AC
  Administered 2012-07-04 (×2): 40 mg via INTRAVENOUS
  Filled 2012-07-04 (×2): qty 4

## 2012-07-04 MED ORDER — METOPROLOL TARTRATE 100 MG PO TABS
100.0000 mg | ORAL_TABLET | Freq: Two times a day (BID) | ORAL | Status: DC
  Administered 2012-07-04 (×2): 100 mg via ORAL
  Filled 2012-07-04 (×4): qty 1

## 2012-07-04 MED ORDER — BISACODYL 5 MG PO TBEC: 10.0000 mg | DELAYED_RELEASE_TABLET | Freq: Every day | ORAL | Status: DC | PRN

## 2012-07-04 MED ORDER — HYDRALAZINE HCL 25 MG PO TABS
25.0000 mg | ORAL_TABLET | Freq: Four times a day (QID) | ORAL | Status: DC
  Administered 2012-07-04 – 2012-07-05 (×4): 25 mg via ORAL
  Filled 2012-07-04 (×9): qty 1

## 2012-07-04 MED ORDER — POTASSIUM CHLORIDE CRYS ER 20 MEQ PO TBCR
40.0000 meq | EXTENDED_RELEASE_TABLET | Freq: Once | ORAL | Status: AC
  Administered 2012-07-04: 40 meq via ORAL
  Filled 2012-07-04: qty 2

## 2012-07-04 MED ORDER — CLONAZEPAM 0.5 MG PO TABS: 0.5000 mg | ORAL_TABLET | Freq: Once | ORAL | Status: DC

## 2012-07-04 NOTE — Progress Notes (Signed)
Attempted again to give pt his PO pills. Pt not able to swallow, making no effort to eat. Will notify MD.

## 2012-07-04 NOTE — Progress Notes (Signed)
[facility] PROGRESS NOTE  Jeremy Carr ZOX:[id number] DOB: 1945-06-23 DOA: 06/30/2012 PCP: Dwana Melena, MD  Brief narrative: This is a 67 year old male with Parkinson's disease and bipolar disorder who slipped down to the floor at home-his wife was unable to get him up but covered him with blankets and allowed him to lay on the floor overnight-he developed hypothermia while laying on the floor at home overnight despite apparently being adequately covered with blankets by his wife. Glasgow Coma Scale on arrival was 3. Patient had V. fib arrest on 3 separate occasions that responded to defibrillation and ACLS drug treatment. Due to poor responsiveness, he was intubated in the ER and was admitted to the ICU. He was noted to have elevated cardiac enzymes and ST depressions which were suspected to be secondary to the hypothermia rather than an acute MI. Patient was able to be extubated on 2/26. It is also noted per history that the patient had increasing confusion over the past 3-4 days prior to being admitted and was due to have a followup psychiatric eval.  Assessment/Plan: Principal Problem:   Acute respiratory failure with hypoxia -Likely initially due to hypothermia and obtunded state -Has been on Unasyn for possibility of aspiration pneumonia with a chest x-ray revealing infiltrate at the bases but more in the left lower lobe versus collapse of the left lower lobe -Not yet completely resolved as he remains on 40% -Ventimask and desaturates with movement -CT of his chest reveals improved pneumonia but pulmonary edema is noted and therefore diuretics were initiated yesterday with improvement and hypoxia -At baseline patient uses 4 L of oxygen which she is down to today   Active Problems:    NSTEMI (non-ST elevated myocardial infarction) -Status post cath on 2/27 which reveals mild nonobstructive coronary artery disease -Continue aspirin statin and metoprolol per cardiology   V.  fib cardiac arrest -Echo reveals an EF of 60-65% without any regional wall motion abnormalities -Rhythm has remained stable  Acute renal failure -Creatinine was elevated at 2.39 on admission and has completely normalized -Suspect it was related to poor renal perfusion in setting of hypothermia  Parkinson's disease Continue home medications and start PT  Bipolar disorder/hospital-acquired delirium Continue home medications Slight confusion during hospital stay is likely hospital-acquired delirium I have adjusted his Klonopin to bedtime routine to help him rest at night and when necessary daily to allow him to remain alert during the day  COPD -Despite his current hypoxic respiratory failure, COPD appears to be stable -At baseline patient uses 4 L of oxygen during exertion and during sleep  Hypertension BP is noted to be elevated despite metoprolol and Norvasc It is noted that patient only takes Norvasc at home Have increased Lopressor to 100 mg twice a day and added labetalol  Hypothyroidism Continue Synthroid  Anemia Previous hemoglobins have been in the 12-13 range Will obtain an anemia profile and stool Hemoccults  Code Status: Full code  Family Communication: Spoke in detail with patient's wife and sister-in-law today Disposition Plan: Follow in step down unit-will likely need to go to SNF for rehabilitation after this hospital stay  Consultants:  Cardiology  Procedures:  Cardiac cath 2/27  Antibiotics:  Unasyn 2/25>>  DVT Prophylaxis:  Lovenox   HPI/Subjective: The patient was confused and restless through the night and is sleepy today. I spoken with his wife and sister-in-law and received some further background on the patient-he mentioned to me that he does not use oxygen at home however they state that  he does use it but not continuously-he is prescribed 4 L of oxygen- uses it intermittently during the day and continuously through the  night  Objective: Filed Vitals:   07/04/12 1400 07/04/12 1500 07/04/12 1600 07/04/12 1700  BP: 113/69 125/71 94/44 113/63  Pulse: 70 74 72 70  Temp:   98.4 F (36.9 C)   TempSrc:   Oral   Resp: 18 19 18 14   Height:      Weight:      SpO2: 100% 96% 94% 94%    Intake/Output Summary (Last 24 hours) at 07/04/12 1749 Last data filed at 07/04/12 1700  Gross per 24 hour  Intake   1350 ml  Output   9375 ml  Net  -8025 ml    Exam:   General:  Sleepy and mumbling today, no acute respiratory distress   Cardiovascular: Regular rate and rhythm no murmurs rubs or gallops  Respiratory: No rhonchi or wheezing, no crackles  Abdomen: Soft nontender nondistended bowel sounds positive  Ext: No cyanosis clubbing or edema  Data Reviewed: Basic Metabolic Panel:  Recent Labs Lab 06/30/12 1103 06/30/12 1151  07/01/12 1030 07/01/12 2245 07/02/12 1026 07/03/12 0430 07/04/12 0430  NA 142  --   < > 145 145 146* 146* 146*  K 2.8*  --   < > 3.5 3.3* 3.3* 3.5 3.1*  CL 109  --   < > 116* 116* 115* 117* 109  CO2 18*  --   < > 16* 17* 20 20 27   GLUCOSE 143*  --   < > 88 99 113* 98 99  BUN 37*  --   < > 31* 25* 21 18 13   CREATININE 2.09*  --   < > 1.42* 1.11 1.02 0.94 0.89  CALCIUM 8.6  --   < > 7.7* 7.8* 8.0* 8.3* 9.0  MG  --  2.5  --   --   --   --   --   --   PHOS  --  5.6*  --   --   --   --   --   --   < > = values in this interval not displayed. Liver Function Tests:  Recent Labs Lab 06/30/12 0841  AST 302*  ALT 22  ALKPHOS 143*  BILITOT 0.3  PROT 7.2  ALBUMIN 3.2*   No results found for this basename: LIPASE, AMYLASE,  in the last 168 hours No results found for this basename: AMMONIA,  in the last 168 hours CBC:  Recent Labs Lab 06/30/12 0841 07/02/12 0500 07/03/12 0430 07/04/12 0430  WBC 9.8 6.1 7.9 9.4  NEUTROABS 8.1*  --   --   --   HGB 12.3* 10.3* 10.8* 11.7*  HCT 37.3* 30.2* 32.0* 34.6*  MCV 91.4 89.1 89.4 88.7  PLT 230 183 187 213   Cardiac  Enzymes:  Recent Labs Lab 06/30/12 2200 07/01/12 0417 07/01/12 1030 07/01/12 1740 07/01/12 2254  CKTOTAL  --   --  4813*  --   --   CKMB  --   --  122.2*  --   --   TROPONINI 6.79* 7.57* 6.36* 4.75* 4.50*   BNP (last 3 results)  Recent Labs  06/30/12 0842  PROBNP 235.7*   CBG:  Recent Labs Lab 07/03/12 1115 07/03/12 2118 07/04/12 0333 07/04/12 0814 07/04/12 1216  GLUCAP 115* 99 109* 112* 96       Studies: Reviewed in detail  Scheduled Meds: . amLODipine  10 mg Oral  Daily  . ampicillin-sulbactam (UNASYN) IV  3 g Intravenous Q6H  . antiseptic oral rinse  15 mL Mouth Rinse QID  . aspirin  324 mg Oral Daily  . atorvastatin  80 mg Per Tube q1800  . benztropine  1 mg Oral TID  . carbidopa-levodopa  1 tablet Oral Custom  . clonazePAM  0.5 mg Oral QHS  . enoxaparin (LOVENOX) injection  40 mg Subcutaneous Q24H  . feeding supplement  1 Container Oral TID BM  . furosemide  40 mg Intravenous Q12H  . hydrALAZINE  25 mg Oral Q6H  . lamoTRIgine  150 mg Oral Daily  . levothyroxine  100 mcg Per Tube QAC breakfast  . metoprolol tartrate  100 mg Oral BID  . QUEtiapine  200 mg Oral Custom  . sertraline  200 mg Oral QHS  . sodium chloride  1,000 mL Intravenous Once   Continuous Infusions: . sodium chloride 20 mL/hr at 07/03/12 2000  . sodium chloride 10 mL/hr at 07/01/12 1509    ________________________________________________________________________  Time spent in minutes: 40    [facility]  [facility] Pager [phone] If 8PM-8AM, please contact night-coverage at [email or web address], password [facility] 07/04/2012, 5:49 PM  LOS: 4 days

## 2012-07-04 NOTE — Progress Notes (Signed)
Pt more awake at this time.Eating food with encouragement. No difficulty noted with swallowing soft food. Meds given with pudding. Sitter in room, family at bedside.

## 2012-07-05 LAB — CBC
HCT: 37.5 % — ABNORMAL LOW (ref 39.0–52.0)
MCHC: 33.6 g/dL (ref 30.0–36.0)
MCV: 89.1 fL (ref 78.0–100.0)
Platelets: 234 10*3/uL (ref 150–400)
RDW: 14.3 % (ref 11.5–15.5)
WBC: 9.3 10*3/uL (ref 4.0–10.5)

## 2012-07-05 LAB — BASIC METABOLIC PANEL
BUN: 18 mg/dL (ref 6–23)
Calcium: 9 mg/dL (ref 8.4–10.5)
Chloride: 100 mEq/L (ref 96–112)
Creatinine, Ser: 0.98 mg/dL (ref 0.50–1.35)
GFR calc Af Amer: >90 mL/min (ref >90)
GFR calc non Af Amer: 83 mL/min — ABNORMAL LOW (ref >90)

## 2012-07-05 LAB — VITAMIN B12: Vitamin B-12: 494 pg/mL (ref 211–911)

## 2012-07-05 LAB — IRON AND TIBC
Saturation Ratios: 14 % — ABNORMAL LOW (ref 20–55)
UIBC: 224 ug/dL (ref 125–400)

## 2012-07-05 LAB — RETICULOCYTES
RBC.: 4.04 MIL/uL — ABNORMAL LOW (ref 4.22–5.81)
Retic Ct Pct: 1.4 % (ref 0.4–3.1)

## 2012-07-05 MED ORDER — METOPROLOL TARTRATE 50 MG PO TABS
50.0000 mg | ORAL_TABLET | Freq: Two times a day (BID) | ORAL | Status: DC
  Administered 2012-07-05 – 2012-07-06 (×2): 50 mg via ORAL
  Filled 2012-07-05 (×3): qty 1

## 2012-07-05 MED ORDER — AMLODIPINE BESYLATE 10 MG PO TABS
10.0000 mg | ORAL_TABLET | Freq: Every day | ORAL | Status: DC
  Administered 2012-07-06: 10 mg via ORAL
  Filled 2012-07-05: qty 1

## 2012-07-05 MED ORDER — HYDRALAZINE HCL 25 MG PO TABS
25.0000 mg | ORAL_TABLET | Freq: Four times a day (QID) | ORAL | Status: DC
  Administered 2012-07-05 – 2012-07-06 (×5): 25 mg via ORAL
  Filled 2012-07-05 (×8): qty 1

## 2012-07-05 NOTE — Progress Notes (Signed)
[facility] PROGRESS NOTE  Jeremy Carr ZOX:[id number] DOB: 08/21/1945 DOA: 06/30/2012 PCP: Dwana Melena, MD  Brief narrative: This is a 67 year old male with Parkinson's disease and bipolar disorder who slipped down to the floor at home-his wife was unable to get him up but covered him with blankets and allowed him to lay on the floor overnight-he developed hypothermia while laying on the floor at home overnight despite apparently being adequately covered with blankets by his wife. Glasgow Coma Scale on arrival was 3. Patient had V. fib arrest on 3 separate occasions that responded to defibrillation and ACLS drug treatment. Due to poor responsiveness, he was intubated in the ER and was admitted to the ICU. He was noted to have elevated cardiac enzymes and ST depressions which were suspected to be secondary to the hypothermia rather than an acute MI. Patient was able to be extubated on 2/26. It is also noted per history that the patient had increasing confusion over the past 3-4 days prior to being admitted and was due to have a followup psychiatric eval.  Assessment/Plan: Principal Problem:   Acute respiratory failure with hypoxia -Likely initially due to hypothermia and obtunded state -Has been given Unasyn for possibility of aspiration pneumonia with a chest x-ray revealing infiltrate at the bases but more in the left lower lobe(versus collapse of the left lower lobe) - has received 5 days of Unasyn and CT performed on 1/28 revealed clearing of infiltrates therefore will d/c today.  -CT of his chest however did reveal mild edema and therefore diuretics were utilized temporarily with improvement and hypoxia -At baseline patient uses 4 L of oxygen which he is back down to- likely can titrate him down further today   Active Problems:    NSTEMI (non-ST elevated myocardial infarction) -Status post cath on 2/27 which reveals mild nonobstructive coronary artery disease -Continue aspirin  statin and metoprolol per cardiology   V. fib cardiac arrest -Echo reveals an EF of 60-65% without any regional wall motion abnormalities -Rhythm has remained stable  Acute renal failure -Creatinine was elevated at 2.39 on admission and has completely normalized -Suspect it was related to poor renal perfusion in setting of hypothermia  Parkinson's disease Continue home medications  CIR recommended by PT- will consult  Bipolar disorder/hospital-acquired delirium -Continue home medications -Slight confusion during hospital stay is likely hospital-acquired delirium - I have adjusted his Klonopin to bedtime routine to help him rest at night and when necessary daily to allow him to remain alert during the day - will allow him to stay in SDU to prevent increased delirium unless bed is needed  COPD -Despite his current hypoxic respiratory failure, COPD appears to be stable -At baseline patient uses 4 L of oxygen during exertion and during sleep  Hypertension -BP was noted to be elevated despite metoprolol and Norvasc - However with dairensis, BP has dropped and therefore, I have d/c'd Hydralazine and cut back on Lopressor.   Hypothyroidism Continue Synthroid  Anemia -Previous hemoglobins have been in the 12-13 range -Will obtain an anemia profile and stool Hemoccults  Code Status: Full code  Family Communication: Spoke in detail with patient's wife and sister-in-law today Disposition Plan: Follow in step down unit-will likely need to go to SNF for rehabilitation after this hospital stay  Consultants:  Cardiology  Procedures:  Cardiac cath 2/27  Antibiotics:  Unasyn 2/25>>  DVT Prophylaxis:  Lovenox   HPI/Subjective: The patient was confused and restless through the night and is sleepy today. I spoken  with his wife and sister-in-law and received some further background on the patient-he mentioned to me that he does not use oxygen at home however they state that he  does use it but not continuously-he is prescribed 4 L of oxygen- uses it intermittently during the day and continuously through the night  Objective: Filed Vitals:   07/05/12 0700 07/05/12 0800 07/05/12 0900 07/05/12 1000  BP: 123/69 132/75 122/67 97/63  Pulse: 66 64 69 64  Temp:  97.8 F (36.6 C)    TempSrc:  Oral    Resp: 9 15 16 13   Height:      Weight:      SpO2: 96% 95% 98% 98%    Intake/Output Summary (Last 24 hours) at 07/05/12 1026 Last data filed at 07/05/12 1000  Gross per 24 hour  Intake   1815 ml  Output   3315 ml  Net  -1500 ml    Exam:   General:  Sleepy and mumbling today, no acute respiratory distress   Cardiovascular: Regular rate and rhythm no murmurs rubs or gallops  Respiratory: No rhonchi or wheezing, no crackles  Abdomen: Soft nontender nondistended bowel sounds positive  Ext: No cyanosis clubbing or edema  Data Reviewed: Basic Metabolic Panel:  Recent Labs Lab 06/30/12 1103 06/30/12 1151  07/01/12 2245 07/02/12 1026 07/03/12 0430 07/04/12 0430 07/05/12 0403  NA 142  --   < > 145 146* 146* 146* 139  K 2.8*  --   < > 3.3* 3.3* 3.5 3.1* 3.4*  CL 109  --   < > 116* 115* 117* 109 100  CO2 18*  --   < > 17* 20 20 27 30   GLUCOSE 143*  --   < > 99 113* 98 99 100*  BUN 37*  --   < > 25* 21 18 13 18   CREATININE 2.09*  --   < > 1.11 1.02 0.94 0.89 0.98  CALCIUM 8.6  --   < > 7.8* 8.0* 8.3* 9.0 9.0  MG  --  2.5  --   --   --   --   --   --   PHOS  --  5.6*  --   --   --   --   --   --   < > = values in this interval not displayed. Liver Function Tests:  Recent Labs Lab 06/30/12 0841  AST 302*  ALT 22  ALKPHOS 143*  BILITOT 0.3  PROT 7.2  ALBUMIN 3.2*   No results found for this basename: LIPASE, AMYLASE,  in the last 168 hours No results found for this basename: AMMONIA,  in the last 168 hours CBC:  Recent Labs Lab 06/30/12 0841 07/02/12 0500 07/03/12 0430 07/04/12 0430 07/05/12 0403  WBC 9.8 6.1 7.9 9.4 9.3  NEUTROABS  8.1*  --   --   --   --   HGB 12.3* 10.3* 10.8* 11.7* 12.6*  HCT 37.3* 30.2* 32.0* 34.6* 37.5*  MCV 91.4 89.1 89.4 88.7 89.1  PLT 230 183 187 213 234   Cardiac Enzymes:  Recent Labs Lab 06/30/12 2200 07/01/12 0417 07/01/12 1030 07/01/12 1740 07/01/12 2254  CKTOTAL  --   --  4813*  --   --   CKMB  --   --  122.2*  --   --   TROPONINI 6.79* 7.57* 6.36* 4.75* 4.50*   BNP (last 3 results)  Recent Labs  06/30/12 0842  PROBNP 235.7*   CBG:  Recent  Labs Lab 07/04/12 0333 07/04/12 0814 07/04/12 1216 07/04/12 2145 07/05/12 0716  GLUCAP 109* 112* 96 118* 111*       Studies: Reviewed in detail  Scheduled Meds: . [START ON 07/06/2012] amLODipine  10 mg Oral Daily  . antiseptic oral rinse  15 mL Mouth Rinse QID  . aspirin  324 mg Oral Daily  . atorvastatin  80 mg Per Tube q1800  . benztropine  1 mg Oral TID  . carbidopa-levodopa  1 tablet Oral Custom  . clonazePAM  0.5 mg Oral QHS  . enoxaparin (LOVENOX) injection  40 mg Subcutaneous Q24H  . feeding supplement  1 Container Oral TID BM  . hydrALAZINE  25 mg Oral Q6H  . lamoTRIgine  150 mg Oral Daily  . levothyroxine  100 mcg Per Tube QAC breakfast  . metoprolol tartrate  50 mg Oral BID  . QUEtiapine  200 mg Oral Custom  . sertraline  200 mg Oral QHS  . sodium chloride  1,000 mL Intravenous Once   Continuous Infusions: . sodium chloride 20 mL/hr at 07/03/12 2000  . sodium chloride 10 mL/hr at 07/01/12 1509    ________________________________________________________________________  Time spent in minutes: 30    [facility]  [facility] Pager [phone] If 8PM-8AM, please contact night-coverage at [email or web address], password [facility] 07/05/2012, 10:26 AM  LOS: 5 days

## 2012-07-05 NOTE — Discharge Summary (Signed)
Physician Discharge Summary  Jeremy Carr:[id number] DOB: 1946/04/27 DOA: 06/30/2012  PCP: Dwana Melena, MD  Admit date: 06/30/2012 Discharge date: 07/06/2012  Time spent: 45 minutes    Discharge Diagnoses:  Hypothermia with:  Acute respiratory failure with hypoxia  V fib arrest  HYPOTHYROIDISM HYPERLIPIDEMIA DISORDER, BIPOLAR NOS PARKINSON'S DISEASE HYPERTENSION COPD NSTEMI (non-ST elevated myocardial infarction) Acute pulmonary edema  Discharge Condition: stable-patient being discharged to rehabilitation facility due to deconditioned status   Diet recommendation: heart healthy  Filed Weights   07/04/12 0448 07/05/12 0359 07/06/12 0431  Weight: 98.4 kg (216 lb 14.9 oz) 90.8 kg (200 lb 2.8 oz) 92.6 kg (204 lb 2.3 oz)    History of present illness:  67 year old male with Parkinson's disease and bipolar disorder who slipped down to the floor at home-his wife was unable to get him up but covered him with blankets as he refused to be brought to the hospital.  He layed on the floor overnight - and unfortunately developed hypothermia and was unresponsive when she found him in the AM. Glasgow Coma Scale on arrival was 3. Patient had V. fib arrest on 3 separate occasions that responded to defibrillation and ACLS drug treatment. Due to poor responsiveness, he was intubated in the ER and was admitted to the ICU. He was noted to have elevated cardiac enzymes and ST depressions which were suspected to be secondary to the hypothermia rather than an acute MI.   Patient was able to be extubated on 2/26.   It is also noted per history that the patient had increasing confusion over the 3-4 days prior to being admitted and was due to have a followup psychiatric eval.    Hospital Course:   Acute respiratory failure with hypoxia  Likely initially due to hypothermia and obtunded state.  Was given Unasyn for possibility of aspiration pneumonia - initial chest x-ray revealed infiltrate at  the bases. He received 5 days of Unasyn and CT performed on 1/28 revealed clearing of infiltrates.CT of his chest however did reveal mild edema and therefore diuretics were utilized temporarily with improvement in hypoxia. At baseline patient uses 4 L of oxygen.  NSTEMI Presented with severe ST depressions noted in V2 through V5 and inverted T waves in 23 and aVF. Initially thought to be a STEMI however CODE STEMI was later canceled as the patient's ST changes were more likely secondary to hypothermia and Osborne waves.Troponin peaked at 7.57, subsequently trending down to 4.5. ECHO completed demonstrated preserved LV function. Cardiac cath on 2/27  revealed mild nonobstructive coronary artery disease.  Aspirin, statin and beta blockers were utilized for med tx.  V. fib cardiac arrest  Responded quickly to defibrillation. Echo reveals an EF of 60-65% without any regional wall motion abnormalities. Rhythm has remained stable. Magnesium levels normal during the hospitalization.  Acute renal failure  Creatinine was elevated at 2.39 on admission and has completely normalized to 0.89. Suspect etiology due to poor renal perfusion in setting of hypothermia and cardiac arrest/V Fib.  Parkinson's disease  Continue home medications - Baclofen changed to prn but may need to re-evaluate if sx become bothersome. Home Mestinon will be resumed after dc - see dc meds.  Bipolar disorder/hospital-acquired delirium  Resume home medications with Seroquel adjusted down to 300 mg only at HS. Slight confusion during hospital stay is likely hospital-acquired delirium. During this hospitalization his Klonopin was adjusted to a bedtime routine to help him rest at night and when necessary daily to allow him to remain  alert during the day   COPD  Despite his current hypoxic respiratory failure, COPD he has remained stable. At baseline patient uses 4 L of oxygen during exertion and during sleep.   Hypertension  Initially  quite hypertensive and metoprolol and hydralazine were added to the patient's home dose of Norvasc. Diuresis helped with BP reduction as well.  Hypothyroidism  Continue Synthroid   Anemia  Previous hemoglobins have been in the 12-13 range with readings this admission as low as 10.8. Have increased back to baseline after diuresis. Anemia profile unremarkable.  Procedures: 2/25: Intubated in ED 2/26: Extubated  Consultations:  Cardiology  PCCM  Discharge Exam: Filed Vitals:   07/06/12 0800 07/06/12 0900 07/06/12 1000 07/06/12 1155  BP: 144/97 119/74 99/62 127/84  Pulse: 65 70  60  Temp:    98.3 F (36.8 C)  TempSrc:    Oral  Resp: 15 15 12 18   Height:      Weight:      SpO2: 95% 99% 94% 95%   General: Awake alert oriented x3, no acute respiratory distress  Cardiovascular: Regular rate and rhythm no murmurs rubs or gallops  Respiratory: Mild rhonchi bilaterally, no wheezing, no crackles; on home dose 4L oxygen Abdomen: Soft nontender nondistended bowel sounds positive  Ext: No cyanosis clubbing or edema  Discharge Instructions      Discharge Orders   Future Orders Complete By Expires     Call MD for:  difficulty breathing, headache or visual disturbances  As directed     Call MD for:  extreme fatigue  As directed     Call MD for:  persistant dizziness or light-headedness  As directed     Call MD for:  temperature >100.4  As directed     Diet - low sodium heart healthy  As directed     Increase activity slowly  As directed     Scheduling Instructions:      PT/OT evaluation upon arrival to continue rehabilitation        Medication List    STOP taking these medications       ibuprofen 400 MG tablet  Commonly known as:  ADVIL,MOTRIN      TAKE these medications       amLODipine 10 MG tablet  Commonly known as:  NORVASC  Take 10 mg by mouth daily.     aspirin EC 81 MG tablet  Take 81 mg by mouth daily.     baclofen 10 MG tablet  Commonly known as:   LIORESAL  Take 1 tablet (10 mg total) by mouth 4 (four) times daily as needed (muscle spasms).     benztropine 1 MG tablet  Commonly known as:  COGENTIN  Take 1 mg by mouth 3 (three) times daily.     bisacodyl 5 MG EC tablet  Commonly known as:  DULCOLAX  Take 2 tablets (10 mg total) by mouth daily as needed.     carbidopa-levodopa 25-100 MG per tablet  Commonly known as:  SINEMET IR  Take 1 tablet by mouth See admin instructions. Take 1 tablet at 7am, 1 tablet at 11am, 1 tablet at noon, 1 tablet at 3pm, 1 tablet at 5pm, 1 tablet at bedtime.     clonazePAM 0.5 MG tablet  Commonly known as:  KLONOPIN  Take 1 tablet (0.5 mg total) by mouth 3 (three) times daily as needed (agita).     feeding supplement Pudg  Take 1 Container by mouth 3 (three) times daily  between meals.     hydrALAZINE 25 MG tablet  Commonly known as:  APRESOLINE  Take 1 tablet (25 mg total) by mouth every 6 (six) hours.     lamoTRIgine 150 MG tablet  Commonly known as:  LAMICTAL  Take 150 mg by mouth daily. 11 am     levothyroxine 100 MCG tablet  Commonly known as:  SYNTHROID, LEVOTHROID  Take 100 mcg by mouth daily.     lubiprostone 24 MCG capsule  Commonly known as:  AMITIZA  Take 24 mcg by mouth 2 (two) times daily.     metoprolol 50 MG tablet  Commonly known as:  LOPRESSOR  Take 1 tablet (50 mg total) by mouth 2 (two) times daily.     omeprazole 20 MG capsule  Commonly known as:  PRILOSEC  Take 20 mg by mouth daily.     polyethylene glycol packet  Commonly known as:  MIRALAX / GLYCOLAX  Take 17 g by mouth daily.     pravastatin 20 MG tablet  Commonly known as:  PRAVACHOL  Take 1 tablet (20 mg total) by mouth daily.     pyridostigmine 60 MG tablet  Commonly known as:  MESTINON  Take 60 mg by mouth 3 (three) times daily.     QUEtiapine 300 MG 24 hr tablet  Commonly known as:  SEROQUEL XR  Take 300 mg by mouth at bedtime. Take with one 200 mg tablet.     sertraline 100 MG tablet   Commonly known as:  ZOLOFT  Take 200 mg by mouth at bedtime.         The results of significant diagnostics from this hospitalization (including imaging, microbiology, ancillary and laboratory) are listed below for reference.    Significant Diagnostic Studies: Ct Head Wo Contrast  06/30/2012  *RADIOLOGY REPORT*  Clinical Data: Unresponsive  CT HEAD WITHOUT CONTRAST  Technique:  Contiguous axial images were obtained from the base of the skull through the vertex without contrast.  Comparison: Brain MRI 08/06/2010, head CT 08/03/2010  IMPRESSION:  1.  No acute intracranial findings. 2.  Atrophy and microvascular disease similar to prior. 3.  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   Original Report Authenticated By: Genevive Bi, M.D.    Ct Angio Chest Pe W/cm &/or Wo Cm  07/03/2012  *RADIOLOGY REPORT*  Clinical Data: Severe dyspnea, evaluate for PE  CT ANGIOGRAPHY CHEST  Technique:  Multidetector CT imaging of the chest using the standard protocol during bolus administration of intravenous contrast. Multiplanar reconstructed images including MIPs were obtained and reviewed to evaluate the vascular anatomy.  Contrast: 65mL OMNIPAQUE IOHEXOL 350 MG/ML SOLN  Comparison: Chest radiograph dated 07/01/2012.  CT chest dated 08/03/2010.   IMPRESSION: No evidence of pulmonary embolism.  Suspected mild interstitial edema.  Small right and trace left pleural effusions.  Associated lower lobe opacities, likely atelectasis.  Underlying severe emphysematous changes.   Original Report Authenticated By: Charline Bills, M.D.    Portable Chest Xray In Am  07/01/2012  *RADIOLOGY REPORT*  Clinical Data: Endotracheal tube, shortness of breath.  PORTABLE CHEST - 1 VIEW  Comparison: 06/30/2012.  IMPRESSION: Bibasilar air space disease with left lower lobe collapse/consolidation, and bilateral pleural effusions.   Original Report Authenticated By: Leanna Battles, M.D.    Dg Chest Port 1  View  06/30/2012  *RADIOLOGY REPORT*  Clinical Data: Chest pain  PORTABLE CHEST - 1 VIEW  Comparison: 08/03/2010  IMPRESSION:  1.  Satisfactory position of ET tube  with tip above the carina. 2.  The right IJ catheter tip is in the right atrium.  No pneumothorax identified. 3.  Pleural effusions and pulmonary edema consistent with CHF.   Original Report Authenticated By: Signa Kell, M.D.     Microbiology: Recent Results (from the past 240 hour(s))  CULTURE, BLOOD (ROUTINE X 2)     Status: None   Collection Time    06/30/12  8:00 AM      Result Value Range Status   Specimen Description BLOOD RIGHT ARM   Final   Special Requests BOTTLES DRAWN AEROBIC ONLY 2CC   Final   Culture  Setup Time 06/30/2012 13:25   Final   Culture NO GROWTH 5 DAYS   Final   Report Status 07/06/2012 FINAL   Final  URINE CULTURE     Status: None   Collection Time    06/30/12  8:38 AM      Result Value Range Status   Specimen Description URINE, CATHETERIZED   Final   Special Requests NONE   Final   Culture  Setup Time 06/30/2012 09:14   Final   Colony Count NO GROWTH   Final   Culture NO GROWTH   Final   Report Status 07/01/2012 FINAL   Final  CULTURE, BLOOD (ROUTINE X 2)     Status: None   Collection Time    06/30/12 11:10 AM      Result Value Range Status   Specimen Description BLOOD LEFT WRIST   Final   Special Requests BOTTLES DRAWN AEROBIC ONLY 5CC   Final   Culture  Setup Time 06/30/2012 21:54   Final   Culture NO GROWTH 5 DAYS   Final   Report Status 07/06/2012 FINAL   Final  MRSA PCR SCREENING     Status: None   Collection Time    06/30/12  2:40 PM      Result Value Range Status   MRSA by PCR NEGATIVE  NEGATIVE Final   Comment:            The GeneXpert MRSA Assay (FDA     approved for NASAL specimens     only), is one component of a     comprehensive MRSA colonization     surveillance program. It is not     intended to diagnose MRSA     infection nor to guide or     monitor treatment for      MRSA infections.  CULTURE, RESPIRATORY (NON-EXPECTORATED)     Status: None   Collection Time    06/30/12  4:07 PM      Result Value Range Status   Specimen Description TRACHEAL ASPIRATE   Final   Special Requests NONE   Final   Gram Stain     Final   Value: MODERATE WBC PRESENT,BOTH PMN AND MONONUCLEAR     ABUNDANT SQUAMOUS EPITHELIAL CELLS PRESENT     ABUNDANT GRAM POSITIVE RODS     MODERATE YEAST     FEW GRAM NEGATIVE RODS   Culture     Final   Value: FEW STREPTOCOCCUS,BETA HEMOLYIC NOT GROUP A     MODERATE YEAST CONSISTENT WITH CANDIDA SPECIES   Report Status 07/02/2012 FINAL   Final  URINE CULTURE     Status: None   Collection Time    07/01/12  6:20 AM      Result Value Range Status   Specimen Description URINE, RANDOM   Final   Special Requests Normal  Final   Culture  Setup Time 07/01/2012 08:57   Final   Colony Count NO GROWTH   Final   Culture NO GROWTH   Final   Report Status 07/02/2012 FINAL   Final     Labs: Basic Metabolic Panel:  Recent Labs Lab 06/30/12 1103 06/30/12 1151  07/02/12 1026 07/03/12 0430 07/04/12 0430 07/05/12 0403 07/06/12 0540  NA 142  --   < > 146* 146* 146* 139 138  K 2.8*  --   < > 3.3* 3.5 3.1* 3.4* 3.6  CL 109  --   < > 115* 117* 109 100 100  CO2 18*  --   < > 20 20 27 30 30   GLUCOSE 143*  --   < > 113* 98 99 100* 88  BUN 37*  --   < > 21 18 13 18 19   CREATININE 2.09*  --   < > 1.02 0.94 0.89 0.98 0.85  CALCIUM 8.6  --   < > 8.0* 8.3* 9.0 9.0 9.5  MG  --  2.5  --   --   --   --   --   --   PHOS  --  5.6*  --   --   --   --   --   --   < > = values in this interval not displayed. Liver Function Tests:  Recent Labs Lab 06/30/12 0841  AST 302*  ALT 22  ALKPHOS 143*  BILITOT 0.3  PROT 7.2  ALBUMIN 3.2*   CBC:  Recent Labs Lab 06/30/12 0841 07/02/12 0500 07/03/12 0430 07/04/12 0430 07/05/12 0403 07/06/12 0540  WBC 9.8 6.1 7.9 9.4 9.3 9.6  NEUTROABS 8.1*  --   --   --   --   --   HGB 12.3* 10.3* 10.8* 11.7*  12.6* 12.9*  HCT 37.3* 30.2* 32.0* 34.6* 37.5* 39.2  MCV 91.4 89.1 89.4 88.7 89.1 90.1  PLT 230 183 187 213 234 241   Cardiac Enzymes:  Recent Labs Lab 06/30/12 2200 07/01/12 0417 07/01/12 1030 07/01/12 1740 07/01/12 2254  CKTOTAL  --   --  4813*  --   --   CKMB  --   --  122.2*  --   --   TROPONINI 6.79* 7.57* 6.36* 4.75* 4.50*   BNP: BNP (last 3 results)  Recent Labs  06/30/12 0842  PROBNP 235.7*   CBG:  Recent Labs Lab 07/04/12 0333 07/04/12 0814 07/04/12 1216 07/04/12 2145 07/05/12 0716  GLUCAP 109* 112* 96 118* 111*   Signed:  Junious Silk, ANP  [facility] 07/06/2012, 1:42 PM  I have personally examined this patient and reviewed the entire database. I have reviewed the above note, made any necessary editorial changes, and agree with its content.  Lonia Blood, MD [facility]

## 2012-07-06 LAB — CULTURE, BLOOD (ROUTINE X 2)
Culture: NO GROWTH
Culture: NO GROWTH

## 2012-07-06 LAB — BASIC METABOLIC PANEL
Calcium: 9.5 mg/dL (ref 8.4–10.5)
GFR calc non Af Amer: 88 mL/min — ABNORMAL LOW (ref >90)
Sodium: 138 mEq/L (ref 135–145)

## 2012-07-06 LAB — CBC
MCH: 29.7 pg (ref 26.0–34.0)
MCHC: 32.9 g/dL (ref 30.0–36.0)
Platelets: 241 10*3/uL (ref 150–400)

## 2012-07-06 MED ORDER — METOPROLOL TARTRATE 50 MG PO TABS: 50.0000 mg | ORAL_TABLET | Freq: Two times a day (BID) | ORAL | Status: AC

## 2012-07-06 MED ORDER — PRAVASTATIN SODIUM 20 MG PO TABS: 20.0000 mg | ORAL_TABLET | Freq: Every day | ORAL | Status: AC

## 2012-07-06 MED ORDER — HYDRALAZINE HCL 25 MG PO TABS: 25.0000 mg | ORAL_TABLET | Freq: Four times a day (QID) | ORAL | Status: AC

## 2012-07-06 MED ORDER — ATORVASTATIN CALCIUM 80 MG PO TABS: 80.0000 mg | ORAL_TABLET | Freq: Every day | ORAL | Status: DC

## 2012-07-06 MED ORDER — ENSURE PUDDING PO PUDG: 1.0000 | Freq: Three times a day (TID) | ORAL | Status: DC

## 2012-07-06 MED ORDER — BISACODYL 5 MG PO TBEC: 10.0000 mg | DELAYED_RELEASE_TABLET | Freq: Every day | ORAL | Status: DC | PRN

## 2012-07-06 MED ORDER — BACLOFEN 10 MG PO TABS: 10.0000 mg | ORAL_TABLET | Freq: Four times a day (QID) | ORAL | Status: AC | PRN

## 2012-07-06 MED ORDER — CLONAZEPAM 0.5 MG PO TABS: 0.5000 mg | ORAL_TABLET | Freq: Three times a day (TID) | ORAL | Status: DC | PRN

## 2012-07-06 NOTE — Progress Notes (Signed)
NURSING PROGRESS NOTE  PRESTIN MUNCH [id number] Discharge Data: 07/06/2012 2:50 PM Attending Peytin Dechert: Lonia Blood, MD WUJ:WJXB,JYNW, MD     Jeremy Carr to be D/C'd Skilled nursing facility per MD order.  All IV's discontinued with no bleeding noted. All belongings returned to patient for patient to take home.   Last Vital Signs:  Blood pressure 127/84, pulse 60, temperature 98.3 F (36.8 C), temperature source Oral, resp. rate 18, height 6' (1.829 m), weight 92.6 kg (204 lb 2.3 oz), SpO2 95.00%.  Discharge Medication List   Medication List    STOP taking these medications       ibuprofen 400 MG tablet  Commonly known as:  ADVIL,MOTRIN      TAKE these medications       amLODipine 10 MG tablet  Commonly known as:  NORVASC  Take 10 mg by mouth daily.     aspirin EC 81 MG tablet  Take 81 mg by mouth daily.     baclofen 10 MG tablet  Commonly known as:  LIORESAL  Take 1 tablet (10 mg total) by mouth 4 (four) times daily as needed (muscle spasms).     benztropine 1 MG tablet  Commonly known as:  COGENTIN  Take 1 mg by mouth 3 (three) times daily.     bisacodyl 5 MG EC tablet  Commonly known as:  DULCOLAX  Take 2 tablets (10 mg total) by mouth daily as needed.     carbidopa-levodopa 25-100 MG per tablet  Commonly known as:  SINEMET IR  Take 1 tablet by mouth See admin instructions. Take 1 tablet at 7am, 1 tablet at 11am, 1 tablet at noon, 1 tablet at 3pm, 1 tablet at 5pm, 1 tablet at bedtime.     clonazePAM 0.5 MG tablet  Commonly known as:  KLONOPIN  Take 1 tablet (0.5 mg total) by mouth 3 (three) times daily as needed (agita).     feeding supplement Pudg  Take 1 Container by mouth 3 (three) times daily between meals.     hydrALAZINE 25 MG tablet  Commonly known as:  APRESOLINE  Take 1 tablet (25 mg total) by mouth every 6 (six) hours.     lamoTRIgine 150 MG tablet  Commonly known as:  LAMICTAL  Take 150 mg by mouth daily. 11 am     levothyroxine 100 MCG tablet  Commonly known as:  SYNTHROID, LEVOTHROID  Take 100 mcg by mouth daily.     lubiprostone 24 MCG capsule  Commonly known as:  AMITIZA  Take 24 mcg by mouth 2 (two) times daily.     metoprolol 50 MG tablet  Commonly known as:  LOPRESSOR  Take 1 tablet (50 mg total) by mouth 2 (two) times daily.     omeprazole 20 MG capsule  Commonly known as:  PRILOSEC  Take 20 mg by mouth daily.     polyethylene glycol packet  Commonly known as:  MIRALAX / GLYCOLAX  Take 17 g by mouth daily.     pravastatin 20 MG tablet  Commonly known as:  PRAVACHOL  Take 1 tablet (20 mg total) by mouth daily.     pyridostigmine 60 MG tablet  Commonly known as:  MESTINON  Take 60 mg by mouth 3 (three) times daily.     QUEtiapine 300 MG 24 hr tablet  Commonly known as:  SEROQUEL XR  Take 300 mg by mouth at bedtime. Take with one 200 mg tablet.     sertraline 100  MG tablet  Commonly known as:  ZOLOFT  Take 200 mg by mouth at bedtime.        Rosalie Doctor, RN

## 2012-07-06 NOTE — Progress Notes (Signed)
OT Cancellation Note  Patient Details Name: Jeremy Carr MRN: [id number] DOB: 18-Feb-1946   Cancelled Treatment:    Reason Eval/Treat Not Completed: Other (comment) (being d/c to [facility])  [name],[name] 07/06/2012, 2:25 PM

## 2012-07-06 NOTE — Progress Notes (Signed)
Clinical Social Worker received phone call from pt's RN-Josh.  Josh currently in room with pt's family.  Family requesting [facility] as first choice and [facility] as second choice.  CSW spoke with Tammy at [facility]-they are unable to offer a bed at this time. CSW staffed case with Eunice Blase at [facility] currently reviewing.  Debbie to call this CSW back shortly regarding bed availability.   Angelia Mould, MSW, [place] [phone]

## 2012-07-06 NOTE — Clinical Social Work Note (Signed)
CSW was consulted to complete discharge of patient. Pt to transfer to [facility] today via [facility]. Family and facility are aware of d/c. D/C packet complete with chart cop.  CSW signing off as no other CSW needs identified at this time.  Lia Foyer, LCSWA [facility] Clinical Social Worker Contact #: [phone]

## 2012-07-06 NOTE — Progress Notes (Signed)
NURSING PROGRESS NOTE  Jeremy Carr [id number] Transfer Data: 07/06/2012 1:28 PM Attending Provider: Lonia Blood, MD OZH:YQMV,HQIO, MD Code Status: full  Jeremy Carr is a 67 y.o. male patient transferred from 2900 No acute distress noted.  No c/o shortness of breath, no c/o chest pain.  Cardiac tele # [phone], in place, cardiac monitor yields:normal sinus rhythm.  Blood pressure 127/84, pulse 60, temperature 98.3 F (36.8 C), temperature source Oral, resp. rate 18, height 6' (1.829 m), weight 92.6 kg (204 lb 2.3 oz), SpO2 95.00%.   IV Fluids:  IV in place, occlusive dsg intact without redness, IV cath hand right, condition patent and no redness normal saline.   Allergies:  Haloperidol lactate; Pramipexole dihydrochloride; and Pregabalin  Past Medical History:   has a past medical history of Parkinson disease; Bipolar 1 disorder; HTN (hypertension); Hyperlipemia; Stroke; COPD (chronic obstructive pulmonary disease); Arthritis; and Anxiety.  Past Surgical History:   has past surgical history that includes Colonoscopy (05/15/2006); Esophagogastroduodenoscopy (02/05/2006); Esophagogastroduodenoscopy (09/06/2009); Colonoscopy (09/06/2009); and Esophageal dilation.  Social History:   reports that he has quit smoking. He does not have any smokeless tobacco history on file. He reports that he does not drink alcohol or use illicit drugs.  Skin: skin  Orientation to room, and floor completed with information packet given to patient/family.   SR up x 2, fall assessment complete, with patient and family able to verbalize understanding of risk associated with falls, and verbalized understanding to call for assistance before getting out of bed.   Call light within reach. Patient able to voice and demonstrate understanding of unit orientation instructions.   Will cont to eval and treat per MD orders.  Rosalie Doctor, RN

## 2012-07-06 NOTE — Progress Notes (Signed)
PT Cancellation Note  Patient Details Name: Jeremy Carr MRN: [id number] DOB: 07/06/1945   Cancelled Treatment:    Reason Treat Not Completed: pt d/c to [facility] today. Will defer further therapies to SNF.  WHITLOW,LAURA HELEN 07/06/2012, 2:25 PM

## 2012-07-06 NOTE — Progress Notes (Signed)
Clinical Social Worker met with pt, pt's wife, and pt's dtr at bedside. CSW provided emotional support and active listening.  CSW reviewed dc planning and provided opportunity for pt and family to address questions and/or concerns.  Pt to dc today to [facility].  CSW updated Unit CSW.    Angelia Mould, MSW, [place] [phone]

## 2012-07-06 NOTE — Progress Notes (Signed)
Clinical Social Worker received phone call from [place] at [facility] are able to offer a bed for pt.  Pt is currently in transit from 2900 to 5500.  Unit CSW made aware.  CSW to continue to follow and assist as needed.   Angelia Mould, MSW, [place] [phone]

## 2012-07-06 NOTE — Care Management Note (Signed)
    Page 1 of 1   07/06/2012     1:26:34 PM   CARE MANAGEMENT NOTE 07/06/2012  Patient:  JONAS, GOH   Account Number:  [id number]  Date Initiated:  06/30/2012  Documentation initiated by:  Junius Creamer  Subjective/Objective Assessment:   adm w arrest     Action/Plan:   lives w wife, pcp dr Timothy Lasso hall   Anticipated DC Date:  07/06/2012   Anticipated DC Plan:  SKILLED NURSING FACILITY  In-house referral  Clinical Social Worker      DC Planning Services  CM consult      Choice offered to / List presented to:             Status of service:  Completed, signed off Medicare Important Message given?   (If response is "NO", the following Medicare IM given date fields will be blank) Date Medicare IM given:   Date Additional Medicare IM given:    Discharge Disposition:  SKILLED NURSING FACILITY  Per UR Regulation:  Reviewed for med. necessity/level of care/duration of stay  If discussed at Long Length of Stay Meetings, dates discussed:    Comments:  07/06/12 13:25 Letha Cape RN, BSN [phone] patient dc to snf today, [facility], CSW following.  2/28 1153 debbie dowell rn,bsn sw to work w fam on poss snf.  2/25 1316 debbie dowell rn,bsn

## 2012-07-06 NOTE — Progress Notes (Signed)
Pt transferred to room 5529 via bed, O2, RN and NT. Report called ahead to [facility]. All personal belongings taken with pt. Family following RN up to new room. CSW updated on pt transfer and family's desire for update.

## 2012-07-20 ENCOUNTER — Encounter: Payer: Self-pay | Admitting: *Deleted

## 2012-07-21 ENCOUNTER — Encounter: Payer: Self-pay | Admitting: Cardiovascular Disease

## 2012-07-21 ENCOUNTER — Ambulatory Visit (INDEPENDENT_AMBULATORY_CARE_PROVIDER_SITE_OTHER): Payer: Medicare Other | Admitting: Cardiovascular Disease

## 2012-07-21 VITALS — BP 93/58 | HR 70 | Ht 72.0 in | Wt 199.8 lb

## 2012-07-21 DIAGNOSIS — I214 Non-ST elevation (NSTEMI) myocardial infarction: Secondary | ICD-10-CM

## 2012-07-21 DIAGNOSIS — G20 Parkinson's disease: Secondary | ICD-10-CM

## 2012-07-21 DIAGNOSIS — I10 Essential (primary) hypertension: Secondary | ICD-10-CM

## 2012-07-21 DIAGNOSIS — E785 Hyperlipidemia, unspecified: Secondary | ICD-10-CM

## 2012-07-21 NOTE — Assessment & Plan Note (Signed)
Well controlled.  Continue current medications and low sodium Dash type diet.    

## 2012-07-21 NOTE — Assessment & Plan Note (Signed)
Cholesterol is at goal.  Continue current dose of statin and diet Rx.  No myalgias or side effects.  F/U  LFT's in 6 months. Lab Results  Component Value Date   LDLCALC 32 07/01/2012

## 2012-07-21 NOTE — Assessment & Plan Note (Signed)
This is his most significant issue in regard to quality of life and lead to his imobility and hypothermia.  F/U neuro and Dr Margo Aye.  PT/OT Continue mestinon and sinemet

## 2012-07-21 NOTE — Patient Instructions (Signed)
Your physician recommends that you schedule a follow-up appointment in: As needed  

## 2012-07-21 NOTE — Assessment & Plan Note (Signed)
No evidence of myocardial damage with elevated enzymes due to rhabdo and hypothermia. Cath by Dr Alice Reichert with no significant CAD and echo with normal EF

## 2012-07-21 NOTE — Progress Notes (Signed)
Patient ID: Jeremy Carr, male   DOB: 01/06/1946, 67 y.o.   MRN: [id number]  Primary Cardiologist:  Excell Seltzer / Alice Reichert  67 yo patient of Dr Excell Seltzer and Dr Elberta Fortis.  Seen in hospital 2/25 after being found unresponsive by wife. Has significant Parkinsons disease.  Elevated troponin thought to be from rhabdomyolysis and hypothermia.  Was intubated.  Did well and subsequent cath showed no CAD and had normal LV function by echo.  EF was 60-65%  Was discharged to rehab Primary is [facility].    ROS: Denies fever, malais, weight loss, blurry vision, decreased visual acuity, cough, sputum, SOB, hemoptysis, pleuritic pain, palpitaitons, heartburn, abdominal pain, melena, lower extremity edema, claudication, or rash.  All other systems reviewed and negative  General: Affect appropriate Chronically ill male HEENT: normal Neck supple with no adenopathy JVP normal no bruits no thyromegaly Lungs clear with no wheezing and good diaphragmatic motion Heart:  S1/S2 no murmur, no rub, gallop or click PMI normal Abdomen: benighn, BS positve, no tenderness, no AAA no bruit.  No HSM or HJR Distal pulses intact with no bruits No edema Neuro non-focal severe parkinsons with tardokinesia Skin warm and dry No muscular weakness   Current Outpatient Prescriptions  Medication Sig Dispense Refill  . amLODipine (NORVASC) 10 MG tablet Take 10 mg by mouth daily.      Marland Kitchen aspirin EC 81 MG tablet Take 81 mg by mouth daily.      . baclofen (LIORESAL) 10 MG tablet Take 1 tablet (10 mg total) by mouth 4 (four) times daily as needed (muscle spasms).  30 each    . benztropine (COGENTIN) 1 MG tablet Take 1 mg by mouth 3 (three) times daily.      . bisacodyl (DULCOLAX) 5 MG EC tablet Take 2 tablets (10 mg total) by mouth daily as needed.  30 tablet    . carbidopa-levodopa (SINEMET) 25-100 MG per tablet Take 1 tablet by mouth See admin instructions. Take 1 tablet at 7am, 1 tablet at 11am, 1 tablet at noon, 1 tablet at  3pm, 1 tablet at 5pm, 1 tablet at bedtime.      . clonazePAM (KLONOPIN) 0.5 MG tablet Take 1 tablet (0.5 mg total) by mouth 3 (three) times daily as needed (agita).  30 tablet    . feeding supplement (ENSURE) PUDG Take 1 Container by mouth 3 (three) times daily between meals.      . hydrALAZINE (APRESOLINE) 25 MG tablet Take 1 tablet (25 mg total) by mouth every 6 (six) hours.      Marland Kitchen ibuprofen (ADVIL,MOTRIN) 400 MG tablet       . lamoTRIgine (LAMICTAL) 150 MG tablet Take 150 mg by mouth daily. 11 am      . levothyroxine (SYNTHROID, LEVOTHROID) 100 MCG tablet Take 100 mcg by mouth daily.      Marland Kitchen lubiprostone (AMITIZA) 24 MCG capsule Take 24 mcg by mouth 2 (two) times daily.      . metoprolol (LOPRESSOR) 50 MG tablet Take 1 tablet (50 mg total) by mouth 2 (two) times daily.      Marland Kitchen omeprazole (PRILOSEC) 20 MG capsule Take 20 mg by mouth daily.       . polyethylene glycol (MIRALAX / GLYCOLAX) packet Take 17 g by mouth daily.      . pravastatin (PRAVACHOL) 20 MG tablet Take 1 tablet (20 mg total) by mouth daily.      Marland Kitchen pyridostigmine (MESTINON) 60 MG tablet Take 60 mg by mouth 3 (three)  times daily.      . QUEtiapine (SEROQUEL XR) 300 MG 24 hr tablet Take 300 mg by mouth at bedtime. Take with one 200 mg tablet.      . sertraline (ZOLOFT) 100 MG tablet Take 200 mg by mouth at bedtime.       No current facility-administered medications for this visit.    Allergies  Haloperidol lactate; Pramipexole dihydrochloride; and Pregabalin  Electrocardiogram:  NSR rate 78 normal ECG  Assessment and Plan

## 2012-08-19 ENCOUNTER — Encounter (HOSPITAL_COMMUNITY): Payer: Self-pay | Admitting: Psychiatry

## 2012-08-19 ENCOUNTER — Ambulatory Visit (INDEPENDENT_AMBULATORY_CARE_PROVIDER_SITE_OTHER): Payer: Medicare Other | Admitting: Psychiatry

## 2012-08-19 DIAGNOSIS — F319 Bipolar disorder, unspecified: Secondary | ICD-10-CM

## 2012-08-19 DIAGNOSIS — F329 Major depressive disorder, single episode, unspecified: Secondary | ICD-10-CM

## 2012-08-19 DIAGNOSIS — E039 Hypothyroidism, unspecified: Secondary | ICD-10-CM

## 2012-08-19 MED ORDER — QUETIAPINE FUMARATE ER 300 MG PO TB24: 300.0000 mg | ORAL_TABLET | Freq: Two times a day (BID) | ORAL | Status: DC

## 2012-08-19 MED ORDER — CLONAZEPAM 0.5 MG PO TABS: 0.5000 mg | ORAL_TABLET | Freq: Three times a day (TID) | ORAL | Status: DC | PRN

## 2012-08-19 MED ORDER — QUETIAPINE FUMARATE 200 MG PO TABS: 200.0000 mg | ORAL_TABLET | Freq: Two times a day (BID) | ORAL | Status: DC

## 2012-08-19 MED ORDER — SERTRALINE HCL 100 MG PO TABS: 200.0000 mg | ORAL_TABLET | Freq: Every day | ORAL | Status: AC

## 2012-08-19 MED ORDER — BENZTROPINE MESYLATE 1 MG PO TABS: 1.0000 mg | ORAL_TABLET | Freq: Three times a day (TID) | ORAL | Status: AC

## 2012-08-19 MED ORDER — LAMOTRIGINE 150 MG PO TABS: 150.0000 mg | ORAL_TABLET | Freq: Every day | ORAL | Status: AC

## 2012-08-19 NOTE — Patient Instructions (Signed)
Keep it up.  Call if problems or concerns.
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
## 2012-08-28 ENCOUNTER — Other Ambulatory Visit (HOSPITAL_COMMUNITY): Payer: Self-pay | Admitting: Internal Medicine

## 2012-08-28 ENCOUNTER — Ambulatory Visit (HOSPITAL_COMMUNITY)
Admission: RE | Admit: 2012-08-28 | Discharge: 2012-08-28 | Disposition: A | Discharge status: Home / Self Care | Payer: Medicare Other | Source: Ambulatory Visit | Attending: Internal Medicine | Admitting: Internal Medicine

## 2012-08-28 DIAGNOSIS — R2681 Unsteadiness on feet: Secondary | ICD-10-CM

## 2012-08-28 DIAGNOSIS — R269 Unspecified abnormalities of gait and mobility: Secondary | ICD-10-CM | POA: Insufficient documentation

## 2012-09-25 ENCOUNTER — Encounter: Payer: Self-pay | Admitting: Neurology

## 2012-09-25 ENCOUNTER — Ambulatory Visit (INDEPENDENT_AMBULATORY_CARE_PROVIDER_SITE_OTHER): Payer: Medicare Other | Admitting: Neurology

## 2012-09-25 VITALS — BP 116/71 | HR 60 | Temp 97.2°F | Ht 67.0 in | Wt 204.0 lb

## 2012-09-25 DIAGNOSIS — I951 Orthostatic hypotension: Secondary | ICD-10-CM

## 2012-09-25 DIAGNOSIS — Z8674 Personal history of sudden cardiac arrest: Secondary | ICD-10-CM

## 2012-09-25 DIAGNOSIS — G20 Parkinson's disease: Secondary | ICD-10-CM

## 2012-09-25 HISTORY — DX: Orthostatic hypotension: I95.1

## 2012-09-25 HISTORY — DX: Personal history of sudden cardiac arrest: Z86.74

## 2012-09-25 NOTE — Patient Instructions (Addendum)
I think overall you are doing fairly well but I do want to suggest a few things today:  I gave instructions regarding your meds to [facility].   Remember to drink plenty of fluid.   Engage in social activities in your community and with your family and try to keep up with current events by reading the newspaper or watching the news.   As far as your medications are concerned, I would like to suggest: no new meds, we are reducing the Mestinon to twice daily and the baclofen to 3 times a day and schedule the sinemet at 3 hour intervals, starting at 7 am    I would like to see you back in 4 months, sooner if we need to. Please call us with any interim questions, concerns, problems, updates or refill requests.

## 2012-09-25 NOTE — Progress Notes (Signed)
Subjective:    Patient ID: Jeremy Carr is a 67 y.o. male.  HPI  Interim Hx:   Jeremy Carr is a 67 year old right-handed gentleman with an underlying medical history bipolar disease, stroke in 2007, arthritis, COPD, degenerative cervical and lumbar spine disease, who presents for followup consultation of his Parkinson's disease or parkinsonism. Sx started on the R with tremor at the age of 67. He is accompanied by his wife today. This is his first visit with me and he was previously followed by Dr. Fayrene Fearing love and was last seen by him on 05/26/2012, at which time Dr. Sandria Manly felt that the patient was stable but because of a history of hallucinations he did not increase his levodopa. The patient has associated memory loss as well. He currently resides at [facility] in [place], [place] since 3/14.   His current medications are Seroquel 500 mg in the mornings and 200 mg at bedtime, Sinemet 25/100 mg strength one tablet at 7 AM, 11 AM, 12, 3 PM, 5 PM and bedtime. Clonazepam 1 mg strength half a tablet at 7 AM and bedtime, baby aspirin, Zoloft 200 mg at night, Lamictal 150 mg daily, amlodipine, Pravachol, hematochezia, baclofen, Synthroid, benztropine 1 mg 3 times a day, MiraLax, Mestinon 60 mg 3 times a day, omeprazole, ibuprofen as needed. He has when necessary tramadol. He is getting physical therapy and occupational therapy.  I reviewed Dr. Imagene Gurney prior notes and the patient's records and below is a summary of that review:  67 year old right-handed gentleman with bipolar disease on lithium therapy in the past between 1976 and 1994, arthritis with chronic lower back pain and status post lumbar spine surgery, gait disorder and postural hypotension. He was diagnosed with Parkinson's disease in the distant past versus parkinsonism because of his IPOL medications. His history is complicated by chronic constipation, memory loss, bladder incontinence, bizarre dreams, hallucinations, spinal stenosis. He has  received epidural steroid injections. He needs assistance with his ADLs. He uses a wheelchair cane or walker. 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 In January 2014 his MMSE was 22, clock drawing was 3, animal fluency was 10.  He has not been eating very well, blaming the food at the SNF. He fell on 06/30/12 and was taken to [facility]. He had slipped down to the floor on the way out of the bathroom at home and his his wife was unable to get him up and she worked 4 hours to try to get him up but covered him with blankets as he refused to be brought to the hospital. He layed on the floor overnight - and unfortunately developed hypothermia and was unresponsive when she found him in the AM. She called EMS. GCS on arrival was 3. Patient had V. fib arrest on 3 separate occasions that responded to defibrillation and ACLS drug treatment. Due to poor responsiveness, he was intubated in the ER and was admitted to the ICU. He was noted to have elevated cardiac enzymes and ST depressions which were suspected to be secondary to the hypothermia rather than an acute MI. He had echo and cardiac cath, showing no sinister findings. He was D/C on 3/314.   His Past Medical History Is Significant For: Past Medical History  Diagnosis Date  . Parkinson disease   . Bipolar 1 disorder   . HTN (hypertension)   . Hyperlipemia   . Stroke   . COPD (chronic obstructive pulmonary disease)   . Arthritis   . Anxiety  His Past Surgical History Is Significant For: Past Surgical History  Procedure Laterality Date  . Colonoscopy  05/15/2006    Normal rectum/Long tortuous colon/Normal terminal ileum/ Polyps in the left and right colon resected as described above  . Esophagogastroduodenoscopy  02/05/2006    Normal stomach, normal D-I and D-II/ Four-quadrant distal esophageal erosions, consistent with moderately severe erosive reflux esophagitis, otherwise normal tubular esophagus  .  Esophagogastroduodenoscopy  09/06/2009    normal esophagus/small hiatal hernia  . Colonoscopy  09/06/2009    normal rectum  . Esophageal dilation      ready for 3rd time    His Family History Is Significant For: Family History  Problem Relation Age of Onset  . Bipolar disorder Father   . Anxiety disorder Mother   . OCD Mother   . Dementia Mother   . Alcohol abuse Sister   . Anxiety disorder Sister   . Dementia Sister   . ADD / ADHD Neg Hx   . Drug abuse Neg Hx   . Depression Neg Hx   . Paranoid behavior Neg Hx   . Schizophrenia Neg Hx   . Seizures Neg Hx   . Sexual abuse Neg Hx   . Physical abuse Neg Hx     His Social History Is Significant For: History   Social History  . Marital Status: Married    Spouse Name: N/A    Number of Children: N/A  . Years of Education: N/A   Social History Main Topics  . Smoking status: Former Games developer  . Smokeless tobacco: None  . Alcohol Use: No  . Drug Use: No  . Sexually Active: None   Other Topics Concern  . None   Social History Narrative  . None    His Allergies Are:  Allergies  Allergen Reactions  . Haloperidol Lactate     REACTION: Paranoid and withdrawal  . Pramipexole Dihydrochloride     REACTION: unknown reaction  . Pregabalin     REACTION: unknown reaction  :   His Current Medications Are:  Outpatient Encounter Prescriptions as of 09/25/2012  Medication Sig Dispense Refill  . amLODipine (NORVASC) 10 MG tablet Take 10 mg by mouth daily.      Marland Kitchen aspirin EC 81 MG tablet Take 81 mg by mouth daily.      . baclofen (LIORESAL) 10 MG tablet Take 1 tablet (10 mg total) by mouth 4 (four) times daily as needed (muscle spasms).  30 each    . benztropine (COGENTIN) 1 MG tablet Take 1 tablet (1 mg total) by mouth 3 (three) times daily.  90 tablet  2  . bisacodyl (DULCOLAX) 5 MG EC tablet Take 2 tablets (10 mg total) by mouth daily as needed.  30 tablet    . carbidopa-levodopa (SINEMET) 25-100 MG per tablet Take 1 tablet by  mouth See admin instructions. Take 1 tablet at 7am, 1 tablet at 11am, 1 tablet at noon, 1 tablet at 3pm, 1 tablet at 5pm, 1 tablet at bedtime.      . clonazePAM (KLONOPIN) 0.5 MG tablet Take 1 tablet (0.5 mg total) by mouth 3 (three) times daily as needed (agita).  90 tablet  2  . feeding supplement (ENSURE) PUDG Take 1 Container by mouth 3 (three) times daily between meals.      . fluticasone (FLONASE) 50 MCG/ACT nasal spray       . hydrALAZINE (APRESOLINE) 25 MG tablet Take 1 tablet (25 mg total) by mouth every 6 (six) hours.      Marland Kitchen  ibuprofen (ADVIL,MOTRIN) 400 MG tablet       . lamoTRIgine (LAMICTAL) 150 MG tablet Take 1 tablet (150 mg total) by mouth daily. 11 am  30 tablet  2  . levothyroxine (SYNTHROID, LEVOTHROID) 100 MCG tablet Take 100 mcg by mouth daily.      . polyethylene glycol (MIRALAX / GLYCOLAX) packet Take 17 g by mouth daily.      . pravastatin (PRAVACHOL) 20 MG tablet Take 1 tablet (20 mg total) by mouth daily.      Marland Kitchen pyridostigmine (MESTINON) 60 MG tablet Take 60 mg by mouth 3 (three) times daily.      . QUEtiapine (SEROQUEL XR) 300 MG 24 hr tablet Take 1 tablet (300 mg total) by mouth 2 (two) times daily. Take with one 200 mg tablet.  60 tablet  2  . QUEtiapine (SEROQUEL) 200 MG tablet Take 1 tablet (200 mg total) by mouth 2 (two) times daily.  60 tablet  2  . sertraline (ZOLOFT) 100 MG tablet Take 2 tablets (200 mg total) by mouth at bedtime.  60 tablet  2  . traMADol (ULTRAM) 50 MG tablet       . lubiprostone (AMITIZA) 24 MCG capsule Take 24 mcg by mouth 2 (two) times daily.      . metoprolol (LOPRESSOR) 50 MG tablet Take 1 tablet (50 mg total) by mouth 2 (two) times daily.      Marland Kitchen omeprazole (PRILOSEC) 20 MG capsule Take 20 mg by mouth daily.        No facility-administered encounter medications on file as of 09/25/2012.   Review of Systems  Constitutional: Positive for appetite change and fatigue.  HENT: Positive for hearing loss, rhinorrhea, trouble swallowing and  tinnitus.   Eyes: Positive for visual disturbance.  Respiratory: Positive for cough.        Snoring  Gastrointestinal: Positive for constipation.       Incontinence  Genitourinary: Positive for difficulty urinating.       Incontinence  Musculoskeletal: Positive for myalgias and arthralgias.  Neurological: Positive for speech difficulty and numbness.       Memory loss  Psychiatric/Behavioral: Positive for confusion, sleep disturbance and dysphoric mood. The patient is nervous/anxious.     Objective:  Neurologic Exam  Physical Exam Physical Examination:   Filed Vitals:   09/25/12 1103  BP: 116/71  Pulse: 60  Temp: 97.2 F (36.2 C)    General Examination: The patient is a very pleasant 67 y.o. male in no acute distress. He is situated in his wheelchair.  HEENT: Normocephalic, atraumatic, pupils are equal, round and reactive to light and accommodation. Funduscopic exam is normal with sharp disc margins noted. Extraocular tracking shows moderate saccadic breakdown without nystagmus noted. There is limitation to upper gaze. There is mild decrease in eye blink rate. Hearing is intact. Tympanic membranes are clear bilaterally. Face is symmetric with moderate facial masking and normal facial sensation. There is no lip, neck or jaw tremor. Neck is moderately rigid with intact passive ROM. There are no carotid bruits on auscultation. Oropharynx exam reveals moderate mouth dryness. No significant airway crowding is noted. Mallampati is class II. Tongue protrudes centrally and palate elevates symmetrically.   There is no drooling.   Chest: is clear to auscultation without wheezing, rhonchi or crackles noted.  Heart: sounds are regular and normal without murmurs, rubs or gallops noted.   Abdomen: is soft, non-tender and non-distended with normal bowel sounds appreciated on auscultation.  Extremities: There is no pitting  edema in the distal lower extremities bilaterally. There are no varicose  veins.  Skin: is warm and dry with no trophic changes noted. Age-related changes are noted on the skin.   Musculoskeletal: exam reveals no obvious joint deformities, tenderness, joint swelling or erythema.  Neurologically:  Mental status: The patient is awake and alert, paying good  attention. He is able to partially provide the history. His wife provides details. He is oriented to: person, place, situation, day of week, month of year and year. His memory, attention, language and knowledge are impaired. There is no aphasia, agnosia, apraxia or anomia. There is a moderate degree of bradyphrenia. Speech is moderately hypophonic with mild dysarthria noted. Mood is congruent and affect is blunted.   Cranial nerves are as described above under HEENT exam. In addition, shoulder shrug is normal with equal shoulder height noted.  Motor exam: Normal bulk, and strength for age is noted. There are no dyskinesias noted.   Tone is mildly rigid with absence of cogwheeling. There is overall moderate bradykinesia. There is no drift or rebound.  There is a moderate resting tremor in the right upper extremity. The tremor is intermittent.  Romberg is not tested.   Reflexes are 1+ in the upper extremities and 1+ in the lower extremities.   Fine motor skills exam: Finger taps are moderately impaired on the right and moderately impaired on the left. Hand movements are moderately impaired on the right and moderately impaired on the left. RAP (rapid alternating patting) is moderately impaired on the right and moderately impaired on the left. Foot taps are moderately impaired on the right and moderately impaired on the left. Foot agility (in the form of heel stomping) is moderately impaired on the right and moderately impaired on the left.    Cerebellar testing shows no dysmetria or intention tremor on finger to nose testing. Heel to shin is unremarkable bilaterally. There is no truncal or gait ataxia.   Sensory exam is  intact to light touch, pinprick, vibration, temperature sense and proprioception in the upper and lower extremities.   Gait, station and balance: I did not have him stand or walk for me today as he did not bring a walker and I did not feel it was safe. In his wheelchair he is leaning towards the right and he has market kyphosis in his upper back.   Assessment and Plan:   Assessment and Plan:  In summary, Jeremy Carr is a very pleasant 67 y.o.-year old male with a history of advanced parkinsonism/Parkinson's disease. He appears to be generally speaking stable in his presentation when I compare my findings with Dr. Imagene Gurney. However, I am worried that he is taking a lot of sedating, i.e. neurotropic and psychotropic medications. I certainly am not going to be able to streamline his medication at this visit as he is new to me and he has a complicated history. He had a recent V. fib arrest. He has fallen in the past and recently fell with complications that lead to his admission to [facility]. He is advised to use his walker at all times and needs supervision with immobilization at all times. I suggested that he take the same amount of Sinemet but take his medication at 3 hourly intervals starting at 7 AM. Therefore, he will be taking Sinemet 25/100 mg strength at 7 AM, 10 AM, 1 PM, 4 PM, 7 PM and 10 PM. I would like to reduce his Mestinon to twice daily namely at  morning and early afternoon. He does spend most of his time in the wheelchair. I would also like to reduce his baclofen to 3 times a day if we can down from 4 times a day. I had a long chat with the patient and his wife about my findings and the diagnosis of parkinsonism/Parksinson's disease, its prognosis and treatment options. His history is certainly complicated by his chronic bipolar disease, back disease, and advanced Parkinson's, with symptoms dating back to about 20 years ago. He has complications with orthostatic hypotension and  hallucinations. We talked about medical treatments and non-pharmacological approaches. We talked about maintaining a healthy lifestyle in general. I encouraged the patient to eat healthy, and keep well hydrated, to keep a scheduled bedtime and wake time routine.  I answered all their questions today and the patient and his wife were in agreement with the above outlined plan. I would like to see the patient back in 4 months, sooner if the need arises and encouraged them to call with any interim questions, concerns, problems or updates and refill requests. I also filled out the consultation and order sheet from his skilled nursing facility.

## 2012-12-10 ENCOUNTER — Telehealth: Payer: Self-pay

## 2013-01-26 ENCOUNTER — Encounter: Payer: Self-pay | Admitting: Neurology

## 2013-01-26 ENCOUNTER — Ambulatory Visit (INDEPENDENT_AMBULATORY_CARE_PROVIDER_SITE_OTHER): Payer: Medicare Other | Admitting: Neurology

## 2013-01-26 VITALS — BP 132/83 | HR 77 | Temp 97.4°F | Ht 67.0 in | Wt 197.0 lb

## 2013-01-26 DIAGNOSIS — R443 Hallucinations, unspecified: Secondary | ICD-10-CM

## 2013-01-26 DIAGNOSIS — G20 Parkinson's disease: Secondary | ICD-10-CM

## 2013-01-26 DIAGNOSIS — F319 Bipolar disorder, unspecified: Secondary | ICD-10-CM

## 2013-01-26 DIAGNOSIS — I951 Orthostatic hypotension: Secondary | ICD-10-CM

## 2013-01-26 NOTE — Progress Notes (Signed)
Subjective:    Patient ID: Jeremy Carr is a 67 y.o. male.  HPI  Interim history:   Mr. Ogan is a 67 year old right-handed gentleman with an underlying medical history bipolar disease, stroke in 2007, arthritis, COPD, degenerative cervical and lumbar spine disease, who presents for followup consultation of his parkinsonism. He is accompanied by his wife again today. I first met him on 09/25/2012, and which time I asked him to take his Sinemet every 3 hours and reduce his baclofen. He had recently sustained a cardiac arrest and was hospitalized and admitted to the ICU through the emergency room. He had fallen on 06/30/2012 and sustained hypothermia and was found unresponsive by his wife in the morning. I did not make any other changes to his medications but was concerned about his multiple neurotropic and psychotropic medications. He has a complex medical history and his parkinsonism is complicated by orthostatic hypotension, and hallucinations. He sees Dr. Lolly Mustache in psychiatry. He c/o more LBP.  Sx started on the R with tremor at the age of 32. He previously followed by Dr. Fayrene Fearing love and was last seen by him on 05/26/2012, at which time Dr. Sandria Manly felt that the patient was stable and because of a history of hallucinations he did not increase his levodopa. The patient has associated memory loss as well. He currently resides at [facility]) in [place], [place] since 3/14.  His current medications are Seroquel 200 mg in the mornings and 500 mg at bedtime, Sinemet 25/100 mg strength one tablet at 7 AM, 11 AM, 12, 3 PM, 5 PM and bedtime. Clonazepam 1 mg strength half a tablet at 7 AM and bedtime, baby aspirin, Zoloft 200 mg at night, Lamictal 150 mg daily, amlodipine, Pravachol, baclofen, Synthroid, benztropine 1 mg 3 times a day, MiraLax, Mestinon 60 mg 3 times a day, omeprazole, ibuprofen as needed. He has when necessary tramadol. He is getting physical therapy and occupational therapy.  He has  bipolar disease on lithium therapy in the past between 1976 and 1994, arthritis with chronic lower back pain, gait disorder and postural hypotension. He was diagnosed with Parkinson's disease in the distant past versus parkinsonism because of his medications. His history is complicated by chronic constipation, memory loss, bladder incontinence, bizarre dreams, hallucinations, spinal stenosis. He has received epidural steroid injections. He needs assistance with his ADLs. He uses a wheelchair cane or walker. 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 In January 2014 his MMSE was 22, clock drawing was 3, animal fluency was 10. He is supposed to be on O2 24/7 for COPD.  His Past Medical History Is Significant For: Past Medical History  Diagnosis Date  . Parkinson disease   . Bipolar 1 disorder   . HTN (hypertension)   . Hyperlipemia   . Stroke   . COPD (chronic obstructive pulmonary disease)   . Arthritis   . Anxiety   . History of cardiac arrest 09/25/2012  . Orthostatic hypotension 09/25/2012    His Past Surgical History Is Significant For: Past Surgical History  Procedure Laterality Date  . Colonoscopy  05/15/2006    Normal rectum/Long tortuous colon/Normal terminal ileum/ Polyps in the left and right colon resected as described above  . Esophagogastroduodenoscopy  02/05/2006    Normal stomach, normal D-I and D-II/ Four-quadrant distal esophageal erosions, consistent with moderately severe erosive reflux esophagitis, otherwise normal tubular esophagus  . Esophagogastroduodenoscopy  09/06/2009    normal esophagus/small hiatal hernia  . Colonoscopy  09/06/2009  normal rectum  . Esophageal dilation      ready for 3rd time    His Family History Is Significant For: Family History  Problem Relation Age of Onset  . Bipolar disorder Father   . Anxiety disorder Mother   . OCD Mother   . Dementia Mother   . Alcohol abuse Sister   . Anxiety disorder Sister   .  Dementia Sister   . ADD / ADHD Neg Hx   . Drug abuse Neg Hx   . Depression Neg Hx   . Paranoid behavior Neg Hx   . Schizophrenia Neg Hx   . Seizures Neg Hx   . Sexual abuse Neg Hx   . Physical abuse Neg Hx     His Social History Is Significant For: History   Social History  . Marital Status: Married    Spouse Name: N/A    Number of Children: N/A  . Years of Education: N/A   Social History Main Topics  . Smoking status: Former Games developer  . Smokeless tobacco: None  . Alcohol Use: No  . Drug Use: No  . Sexual Activity: None   Other Topics Concern  . None   Social History Narrative  . None    His Allergies Are:  Allergies  Allergen Reactions  . Haloperidol Lactate     REACTION: Paranoid and withdrawal  . Pramipexole Dihydrochloride     REACTION: unknown reaction  . Pregabalin     REACTION: unknown reaction  :   His Current Medications Are:  Outpatient Encounter Prescriptions as of 01/26/2013  Medication Sig Dispense Refill  . amLODipine (NORVASC) 10 MG tablet Take 10 mg by mouth daily.      Marland Kitchen aspirin EC 81 MG tablet Take 81 mg by mouth daily.      . baclofen (LIORESAL) 10 MG tablet Take 1 tablet (10 mg total) by mouth 4 (four) times daily as needed (muscle spasms).  30 each    . benztropine (COGENTIN) 1 MG tablet Take 1 tablet (1 mg total) by mouth 3 (three) times daily.  90 tablet  2  . bisacodyl (DULCOLAX) 5 MG EC tablet Take 2 tablets (10 mg total) by mouth daily as needed.  30 tablet    . carbidopa-levodopa (SINEMET) 25-100 MG per tablet Take 1 tablet by mouth See admin instructions. Take 1 tablet at 7am, 1 tablet at 11am, 1 tablet at noon, 1 tablet at 3pm, 1 tablet at 5pm, 1 tablet at bedtime.      . clonazePAM (KLONOPIN) 0.5 MG tablet Take 1 tablet (0.5 mg total) by mouth 3 (three) times daily as needed (agita).  90 tablet  2  . feeding supplement (ENSURE) PUDG Take 1 Container by mouth 3 (three) times daily between meals.      . fluticasone (FLONASE) 50 MCG/ACT  nasal spray       . hydrALAZINE (APRESOLINE) 25 MG tablet Take 1 tablet (25 mg total) by mouth every 6 (six) hours.      Marland Kitchen ibuprofen (ADVIL,MOTRIN) 400 MG tablet       . lamoTRIgine (LAMICTAL) 150 MG tablet Take 1 tablet (150 mg total) by mouth daily. 11 am  30 tablet  2  . levothyroxine (SYNTHROID, LEVOTHROID) 100 MCG tablet Take 100 mcg by mouth daily.      Marland Kitchen lubiprostone (AMITIZA) 24 MCG capsule Take 24 mcg by mouth 2 (two) times daily.      . metoprolol (LOPRESSOR) 50 MG tablet Take 1 tablet (50  mg total) by mouth 2 (two) times daily.      Marland Kitchen omeprazole (PRILOSEC) 20 MG capsule Take 20 mg by mouth daily.       . polyethylene glycol (MIRALAX / GLYCOLAX) packet Take 17 g by mouth daily.      . pravastatin (PRAVACHOL) 20 MG tablet Take 1 tablet (20 mg total) by mouth daily.      Marland Kitchen pyridostigmine (MESTINON) 60 MG tablet Take 60 mg by mouth 3 (three) times daily.      . QUEtiapine (SEROQUEL XR) 300 MG 24 hr tablet Take 300 mg by mouth at bedtime. Take with one 200 mg tablet.      . QUEtiapine (SEROQUEL) 200 MG tablet Take 200 mg by mouth 2 (two) times daily. At noon and at bedtime.      . sertraline (ZOLOFT) 100 MG tablet Take 2 tablets (200 mg total) by mouth at bedtime.  60 tablet  2  . traMADol (ULTRAM) 50 MG tablet       . [DISCONTINUED] QUEtiapine (SEROQUEL XR) 300 MG 24 hr tablet Take 1 tablet (300 mg total) by mouth 2 (two) times daily. Take with one 200 mg tablet.  60 tablet  2  . [DISCONTINUED] QUEtiapine (SEROQUEL) 200 MG tablet Take 1 tablet (200 mg total) by mouth 2 (two) times daily.  60 tablet  2   No facility-administered encounter medications on file as of 01/26/2013.  :  Review of Systems  Constitutional: Positive for fever, chills, appetite change, fatigue and unexpected weight change (weight loss).  HENT: Positive for hearing loss, rhinorrhea, trouble swallowing and tinnitus.   Eyes: Positive for pain and visual disturbance.  Respiratory: Positive for shortness of breath,  wheezing and stridor.        Snoring  Cardiovascular: Positive for chest pain, palpitations and leg swelling.  Gastrointestinal: Positive for constipation.       Incontinence  Endocrine: Positive for cold intolerance, heat intolerance and polydipsia.  Genitourinary: Positive for difficulty urinating.       Incontinence  Musculoskeletal: Positive for myalgias, joint swelling, arthralgias and gait problem.  Neurological: Positive for dizziness, tremors, speech difficulty, weakness, numbness and headaches.       Memory loss  Hematological: Bruises/bleeds easily.  Psychiatric/Behavioral: Positive for confusion, dysphoric mood and agitation. The patient is nervous/anxious.     Objective:  Neurologic Exam  Physical Exam Physical Examination:   Filed Vitals:   01/26/13 1530  BP: 132/83  Pulse: 77  Temp: 97.4 F (36.3 C)    General Examination: The patient is a very pleasant 67 y.o. male in no acute distress. He is situated in his wheelchair. He is frail appearing. He did not bring his oxygen today.  HEENT: Normocephalic, atraumatic, pupils are equal, round and reactive to light and accommodation. Extraocular tracking shows moderate saccadic breakdown without nystagmus noted. There is limitation to upper gaze. There is mild decrease in eye blink rate. Hearing is intact. Face is symmetric with moderate facial masking and normal facial sensation. There is no lip, neck or jaw tremor. Neck is moderately rigid with intact passive ROM. There are no carotid bruits on auscultation. Oropharynx exam reveals moderate mouth dryness. No significant airway crowding is noted. Mallampati is class II. Tongue protrudes centrally and palate elevates symmetrically.   There is no drooling.   Chest: is clear to auscultation without wheezing, rhonchi or crackles noted.  Heart: sounds are regular and normal without murmurs, rubs or gallops noted.   Abdomen: is soft, non-tender  and non-distended with normal  bowel sounds appreciated on auscultation.  Extremities: There is no pitting edema in the distal lower extremities bilaterally. There are no varicose veins.  Skin: is warm and dry with no trophic changes noted. Age-related changes are noted on the skin.   Musculoskeletal: exam reveals no obvious joint deformities, tenderness, joint swelling or erythema.  Neurologically:  Mental status: The patient is awake and alert, paying good  attention. He is able to partially provide the history. His wife provides details. He is oriented to: person, place, situation, day of week, month of year and year. His memory, attention, language and knowledge are impaired. There is no aphasia, agnosia, apraxia or anomia. There is a moderate degree of bradyphrenia. Speech is moderately hypophonic with mild dysarthria noted. Mood is congruent and affect is blunted.   Cranial nerves are as described above under HEENT exam. In addition, shoulder shrug is normal with equal shoulder height noted.  Motor exam: Normal bulk, and strength for age is noted. There are no dyskinesias noted.   Tone is mildly rigid with absence of cogwheeling. There is overall moderate bradykinesia. There is no drift or rebound.  There is a moderate resting tremor in the right upper extremity. The tremor is intermittent.  Romberg is not tested.   Reflexes are 1+ in the upper extremities and 1+ in the lower extremities.   Fine motor skills exam: Finger taps are moderately impaired on the right and moderately impaired on the left. Hand movements are moderately impaired on the right and moderately impaired on the left. RAP (rapid alternating patting) is moderately impaired on the right and moderately impaired on the left. Foot taps are moderately impaired on the right and moderately impaired on the left. Foot agility (in the form of heel stomping) is moderately impaired on the right and moderately impaired on the left.    Cerebellar testing shows no  dysmetria or intention tremor on finger to nose testing. Heel to shin is unremarkable bilaterally. There is no truncal or gait ataxia.   Sensory exam is intact to light touch.   Gait, station and balance: I did not have him stand or walk for me today as he did not bring a walker and I did not feel it was safe. In his wheelchair he is leaning towards the right and he has marked kyphosis in his upper back.   Assessment and Plan:   In summary, KUTLER VANVRANKEN is a very pleasant 67 y.o.-year old male with a history of advanced parkinsonism vs. R sided Parkinson's disease. He appears to be generally speaking stable. He has a complicated history with longstanding Hx of bipolar, back disease, and recent V. fib arrest. He has fallen in the past and recently fell with complications that lead to his admission to [facility] and since then he has been in SNF. He is advised to use his walker at all times and needs supervision with mobilization at all times. I will not change his medications at this time. He does spend most of his time in the wheelchair. I had a long chat with the patient and his wife about my findings and the diagnosis of parkinsonism/Parksinson's disease, its prognosis and treatment options. His history is certainly complicated by his chronic bipolar disease, psychotropic medications, history of lithium use for about 18 years, back disease, and advanced Parkinson's, with symptoms dating back to about 20 years ago. He has complications with orthostatic hypotension and hallucinations. We talked about medical treatments  and non-pharmacological approaches. We talked about maintaining a healthy lifestyle in general. I encouraged the patient to eat healthy, and keep well hydrated, to keep a scheduled bedtime and wake time routine. He is at risk for falling. I answered all their questions today and the patient and his wife were in agreement with the above outlined plan. I would like to see the  patient back in 4 months, sooner if the need arises and encouraged them to call with any interim questions, concerns, problems or updates and refill requests. I also filled out the consultation sheet from his skilled nursing facility.

## 2013-01-26 NOTE — Patient Instructions (Signed)
I think overall you are doing fairly well but I do want to suggest a few things today:  Remember to drink plenty of fluid, eat healthy meals and do not skip any meals. Use your walker or wheel chair at all times. You are at risk for falling.   As far as your medications are concerned, I would like to suggest no changes.    As far as diagnostic testing: no new test.   I would like to see you back in 4 months, sooner if we need to. Please call us with any interim questions, concerns, problems, updates or refill requests.  Brett Canales is my clinical assistant and will answer any of your questions and relay your messages to me and also relay most of my messages to you.  Our phone number is [phone]. We also have an after hours call service for urgent matters and there is a physician on-call for urgent questions. For any emergencies you know to call 911 or go to the nearest emergency room.

## 2013-01-28 ENCOUNTER — Ambulatory Visit: Payer: Self-pay | Admitting: Gastroenterology

## 2013-02-03 ENCOUNTER — Ambulatory Visit (INDEPENDENT_AMBULATORY_CARE_PROVIDER_SITE_OTHER): Payer: Medicare Other | Admitting: Gastroenterology

## 2013-02-03 ENCOUNTER — Encounter: Payer: Self-pay | Admitting: Gastroenterology

## 2013-02-03 VITALS — BP 81/56 | HR 62 | Temp 98.2°F

## 2013-02-03 DIAGNOSIS — R634 Abnormal weight loss: Secondary | ICD-10-CM

## 2013-02-03 DIAGNOSIS — K5909 Other constipation: Secondary | ICD-10-CM

## 2013-02-03 DIAGNOSIS — I959 Hypotension, unspecified: Secondary | ICD-10-CM | POA: Insufficient documentation

## 2013-02-03 DIAGNOSIS — R109 Unspecified abdominal pain: Secondary | ICD-10-CM

## 2013-02-03 NOTE — Progress Notes (Signed)
Primary Care Physician:  Catalina Pizza, MD  Primary Gastroenterologist:  Roetta Sessions, MD   Chief Complaint  Patient presents with  . Abdominal Pain    HPI:  Jeremy Carr is a 67 y.o. male here for further evaluation of abdominal pain. He resides at [facility] since spring of this year. Is accompanied by wife today. Significant illness in 06/2012, with hypothermia associated with acute respiratory failure and V fib arrest. Occurred after patient fell and could not get up and refused having family notify EMS. Since that time wife states patient has lost over 20 pounds. Patient reports appetite good sometimes but now always. He complains of one year h/o migratory abdominal pain. Pain not worse with meals. Takes pain pill which helps. Chronic constipation. Daily pain.  Difficulty swallowing pills/liquids/solids. H/O Parkinson's. Last EGD/ED in 2011 help a lot with dysphagia but symptoms coming back. No melena, brbpr. BM every 2-3 days. BMs soft to loose. Takes dulcolax prn, amitiza BID, miralax 17 g daily.  Also complains of hypotension not being addressed by nursing staff at [facility]. Complains of dizziness.  Current Outpatient Prescriptions  Medication Sig Dispense Refill  . amLODipine (NORVASC) 10 MG tablet Take 10 mg by mouth daily.      Marland Kitchen aspirin EC 81 MG tablet Take 81 mg by mouth daily.      . baclofen (LIORESAL) 10 MG tablet Take 1 tablet (10 mg total) by mouth 4 (four) times daily as needed (muscle spasms).  30 each    . benztropine (COGENTIN) 1 MG tablet Take 1 tablet (1 mg total) by mouth 3 (three) times daily.  90 tablet  2  . bisacodyl (DULCOLAX) 5 MG EC tablet Take 2 tablets (10 mg total) by mouth daily as needed.  30 tablet    . carbidopa-levodopa (SINEMET) 25-100 MG per tablet Take 1 tablet by mouth See admin instructions. Take 1 tablet at 7am, 1 tablet at 11am, 1 tablet at noon, 1 tablet at 3pm, 1 tablet at 5pm, 1 tablet at bedtime.      . clonazePAM (KLONOPIN) 0.5 MG tablet  Take 1 tablet (0.5 mg total) by mouth 3 (three) times daily as needed (agita).  90 tablet  2  . hydrALAZINE (APRESOLINE) 25 MG tablet Take 1 tablet (25 mg total) by mouth every 6 (six) hours.      Marland Kitchen lamoTRIgine (LAMICTAL) 150 MG tablet Take 1 tablet (150 mg total) by mouth daily. 11 am  30 tablet  2  . levothyroxine (SYNTHROID, LEVOTHROID) 112 MCG tablet 112 mcg daily.      Marland Kitchen lubiprostone (AMITIZA) 24 MCG capsule Take 24 mcg by mouth 2 (two) times daily.      . metoprolol (LOPRESSOR) 50 MG tablet Take 1 tablet (50 mg total) by mouth 2 (two) times daily.      Marland Kitchen omeprazole (PRILOSEC) 20 MG capsule Take 20 mg by mouth 2 (two) times daily.       . polyethylene glycol (MIRALAX / GLYCOLAX) packet Take 17 g by mouth daily.      . pravastatin (PRAVACHOL) 20 MG tablet Take 1 tablet (20 mg total) by mouth daily.      Marland Kitchen pyridostigmine (MESTINON) 60 MG tablet Take 60 mg by mouth 2 (two) times daily.       . QUEtiapine (SEROQUEL XR) 300 MG 24 hr tablet Take 500 mg by mouth at bedtime. Take with one 200 mg tablet.      . QUEtiapine (SEROQUEL) 200 MG tablet Take 200  mg by mouth daily. At noon and at bedtime.      . sertraline (ZOLOFT) 100 MG tablet Take 2 tablets (200 mg total) by mouth at bedtime.  60 tablet  2  . traMADol (ULTRAM) 50 MG tablet Take 50 mg by mouth at bedtime.        No current facility-administered medications for this visit.    Allergies as of 02/03/2013 - Review Complete 02/03/2013  Allergen Reaction Noted  . Haloperidol lactate  04/09/2006  . Pramipexole dihydrochloride    . Pregabalin      Past Medical History  Diagnosis Date  . Parkinson disease   . Bipolar 1 disorder   . HTN (hypertension)   . Hyperlipemia   . Stroke   . COPD (chronic obstructive pulmonary disease)   . Arthritis   . Anxiety   . History of cardiac arrest 09/25/2012  . Orthostatic hypotension 09/25/2012    Past Surgical History  Procedure Laterality Date  . Colonoscopy  05/15/2006    Normal rectum/Long  tortuous colon/Normal terminal ileum/ Polyps in the left and right colon resected as described above  . Esophagogastroduodenoscopy  02/05/2006    Normal stomach, normal D-I and D-II/ Four-quadrant distal esophageal erosions, consistent with moderately severe erosive reflux esophagitis, otherwise normal tubular esophagus  . Esophagogastroduodenoscopy  09/06/2009    normal esophagus/small hiatal hernia. s/p 28F  . Colonoscopy  09/06/2009    normal rectum, long tortuous colon. repeat surveillance TCS 09/2014  . Esophageal dilation      ready for 3rd time    Family History  Problem Relation Age of Onset  . Bipolar disorder Father   . Anxiety disorder Mother   . OCD Mother   . Dementia Mother   . Alcohol abuse Sister   . Anxiety disorder Sister   . Dementia Sister   . ADD / ADHD Neg Hx   . Drug abuse Neg Hx   . Depression Neg Hx   . Paranoid behavior Neg Hx   . Schizophrenia Neg Hx   . Seizures Neg Hx   . Sexual abuse Neg Hx   . Physical abuse Neg Hx     History   Social History  . Marital Status: Married    Spouse Name: N/A    Number of Children: N/A  . Years of Education: N/A   Occupational History  . Not on file.   Social History Main Topics  . Smoking status: Former Games developer  . Smokeless tobacco: Not on file  . Alcohol Use: No     Comment: remote heavy use  . Drug Use: No  . Sexual Activity: Not on file   Other Topics Concern  . Not on file   Social History Narrative  . No narrative on file      ROS:  General: Negative for fever, chills, fatigue. See hpi Eyes: Negative for vision changes.  ENT: Negative for hoarseness,  nasal congestion. See hpi. CV: Negative for chest pain, angina, palpitations, dyspnea on exertion, peripheral edema.  Respiratory: Negative for dyspnea at rest, dyspnea on exertion, cough, sputum, wheezing.  GI: See history of present illness. GU:  Negative for dysuria, hematuria, urinary incontinence, urinary frequency, nocturnal urination.   MS: Negative for joint pain, low back pain.  Derm: Negative for rash or itching.  Neuro: Negative abnormal sensation, seizure, frequent headaches, memory loss, confusion.  +weakness Psych: Negative for anxiety, depression, suicidal ideation, hallucinations.  Endo: see hpi..  Heme: Negative for bruising or bleeding. Allergy: Negative  for rash or hives.    Physical Examination:  BP 81/56  Pulse 62  Temp(Src) 98.2 F (36.8 C) (Oral)   General: Well-nourished, well-developed in no acute distress. Accompanied by wife. Able to transfer from wheelchair to table with assistance.  Head: Normocephalic, atraumatic.   Eyes: Conjunctiva pink, no icterus. Mouth: Oropharyngeal mucosa moist and pink , no lesions erythema or exudate. Neck: Supple without thyromegaly, masses, or lymphadenopathy.  Lungs: Clear to auscultation bilaterally.  Heart: Regular rate and rhythm, no murmurs rubs or gallops.  Abdomen: Bowel sounds are normal, mild RLQ/epigastric tenderness, nondistended, no hepatosplenomegaly or masses, no abdominal bruits or    hernia , no rebound or guarding.   Rectal: not performed Extremities: No lower extremity edema. No clubbing or deformities.  Neuro: Alert and oriented x 4.  Slow gait, flat affect with h/o Parkinson's. , grossly normal neurologically.  Skin: Warm and dry, no rash or jaundice.   Psych: Alert and cooperative, normal mood and affect.    Imaging Studies: No results found.

## 2013-02-03 NOTE — Patient Instructions (Addendum)
1. Lab work and CT scan of your abdomen to further evaluate your pain, weight loss, hypotension. 2. I will have nursing staff contact Dr. Selena Batten regarding hypotension.

## 2013-02-04 ENCOUNTER — Encounter: Payer: Self-pay | Admitting: Gastroenterology

## 2013-02-04 ENCOUNTER — Other Ambulatory Visit (HOSPITAL_COMMUNITY): Payer: Self-pay | Admitting: Internal Medicine

## 2013-02-04 DIAGNOSIS — R109 Unspecified abdominal pain: Secondary | ICD-10-CM

## 2013-02-04 NOTE — Assessment & Plan Note (Addendum)
67 y/o male who presents with complaints of migratory abdominal pain for nearly one year. C/o associated anorexia, chronic constipation, difficulty swallowing pills/liquids/solids, 20 pound weight loss. Also with hypotension on multiple antihypertensive drugs. Etiology of abdominal pain unclear. His constipation appears to be adequately treated. His dysphagia is likely multifactorial.  1. CT A/P with contrast initially to evaluate abdominal pain, weight loss. 2. Labs. 3. Request Dr. Selena Batten to address his hypotension.

## 2013-02-04 NOTE — Progress Notes (Signed)
CC'd to PCP 

## 2013-02-09 ENCOUNTER — Ambulatory Visit (HOSPITAL_COMMUNITY)
Admission: RE | Admit: 2013-02-09 | Discharge: 2013-02-09 | Disposition: A | Discharge status: Home / Self Care | Payer: Medicare Other | Source: Ambulatory Visit | Attending: Gastroenterology | Admitting: Gastroenterology

## 2013-02-09 ENCOUNTER — Ambulatory Visit (HOSPITAL_COMMUNITY): Payer: Medicare Other

## 2013-02-09 DIAGNOSIS — G20 Parkinson's disease: Secondary | ICD-10-CM | POA: Insufficient documentation

## 2013-02-09 DIAGNOSIS — K5909 Other constipation: Secondary | ICD-10-CM

## 2013-02-09 DIAGNOSIS — I10 Essential (primary) hypertension: Secondary | ICD-10-CM | POA: Insufficient documentation

## 2013-02-09 DIAGNOSIS — R634 Abnormal weight loss: Secondary | ICD-10-CM | POA: Insufficient documentation

## 2013-02-09 DIAGNOSIS — R109 Unspecified abdominal pain: Secondary | ICD-10-CM | POA: Insufficient documentation

## 2013-02-09 DIAGNOSIS — I959 Hypotension, unspecified: Secondary | ICD-10-CM

## 2013-02-09 DIAGNOSIS — G20A1 Parkinson's disease without dyskinesia, without mention of fluctuations: Secondary | ICD-10-CM | POA: Insufficient documentation

## 2013-02-09 MED ORDER — IOHEXOL 300 MG/ML  SOLN
100.0000 mL | Freq: Once | INTRAMUSCULAR | Status: AC | PRN
  Administered 2013-02-09: 100 mL via INTRAVENOUS

## 2013-02-09 NOTE — Progress Notes (Signed)
Received copy of labs dated 02/03/2013 White blood cell count 6900, hemoglobin 12.7, hematocrit 39.7, MCV 90.9, platelets 237,000, glucose 96, calcium 9.4, BUN 16, creatinine 1.07, sodium 142, potassium 4.3, total bilirubin 1.4, albumin 3.6, ALT 15, AST 16, alkaline phosphatase 60, TSH 2.60, lipase 13.  Slight anemia, mild.   Patient needs iron/tibc, ferritin. Await CT findings. I spoke with peer-to-peer review and is has been approved for CT A/P with contrast.

## 2013-02-10 NOTE — Progress Notes (Signed)
Quick Note:  Slight increase in stool load. Nothing to explain abdominal pain or weight loss.  He had mild anemia. Needs iron/tibc, ferritin, ifobt now. Further recommendations to follow. Patient sure wife aware of above. ______

## 2013-02-12 NOTE — Progress Notes (Signed)
Quick Note:  What is status on getting the iron/tibc, ferritin, ifobt done? ______

## 2013-02-15 LAB — COMPREHENSIVE METABOLIC PANEL
ALT: 15 U/L (ref 10–40)
AST: 16 U/L
Albumin: 3.6
Calcium: 9.4 mg/dL
Creat: 1.07
Glucose: 96
Potassium: 4.3 mmol/L
Sodium: 142 mmol/L (ref 137–147)

## 2013-02-15 LAB — CBC
HCT: 40 %
HGB: 12.7 g/dL
WBC: 6.9
platelet count: 237

## 2013-02-16 ENCOUNTER — Other Ambulatory Visit: Payer: Self-pay

## 2013-02-16 ENCOUNTER — Other Ambulatory Visit: Payer: Self-pay | Admitting: Gastroenterology

## 2013-02-16 DIAGNOSIS — D649 Anemia, unspecified: Secondary | ICD-10-CM

## 2013-02-17 NOTE — Progress Notes (Signed)
Lab orders have been faxed to [facility].

## 2013-03-02 NOTE — Progress Notes (Signed)
Quick Note:  I have not received copy of iron/tibc, ferritin, or ifobt results. I need ASAP. This has been ongoing process for 3 weeks now. ______

## 2013-03-08 NOTE — Progress Notes (Signed)
Quick Note:  Received ferritin dated 02/18/13 of 37.74 (normal 30-400). DID NOT receive iron/tibc OR iFOBT. We have requested numerous times.  At this time, best option is to bring patient back in to office to reevaluate his abdominal pain, swallowing concerns, anemia. Need OV with RMR only in 2-3 weeks. Would be nice if the nursing home could get hemoccults X 3 in the meantime. See if we can attempt this. ______

## 2013-03-10 ENCOUNTER — Encounter: Payer: Self-pay | Admitting: Internal Medicine

## 2013-03-18 LAB — CBC WITH DIFFERENTIAL/PLATELET
HGB: 11.1 g/dL
WBC: 10
platelet count: 258

## 2013-03-26 ENCOUNTER — Ambulatory Visit: Payer: Self-pay | Admitting: Internal Medicine

## 2013-04-06 ENCOUNTER — Ambulatory Visit: Payer: Self-pay | Admitting: Internal Medicine

## 2013-06-02 ENCOUNTER — Encounter: Payer: Self-pay | Admitting: Neurology

## 2013-06-02 ENCOUNTER — Ambulatory Visit (INDEPENDENT_AMBULATORY_CARE_PROVIDER_SITE_OTHER): Payer: PRIVATE HEALTH INSURANCE | Admitting: Neurology

## 2013-06-02 ENCOUNTER — Encounter (INDEPENDENT_AMBULATORY_CARE_PROVIDER_SITE_OTHER): Payer: Self-pay

## 2013-06-02 VITALS — BP 94/56 | HR 61 | Temp 98.5°F | Ht 67.0 in

## 2013-06-02 DIAGNOSIS — G20 Parkinson's disease: Secondary | ICD-10-CM

## 2013-06-02 DIAGNOSIS — I951 Orthostatic hypotension: Secondary | ICD-10-CM

## 2013-06-02 DIAGNOSIS — Z8674 Personal history of sudden cardiac arrest: Secondary | ICD-10-CM

## 2013-06-02 DIAGNOSIS — F319 Bipolar disorder, unspecified: Secondary | ICD-10-CM

## 2013-06-02 NOTE — Patient Instructions (Addendum)
I think overall you are doing fairly well but I do want to suggest a few things today:  Remember to drink plenty of fluid, eat healthy meals and do not skip any meals. Try to eat protein with a every meal and eat a healthy snack such as fruit or nuts in between meals. Try to keep a regular sleep-wake schedule and try to exercise daily, particularly in the form of walking, 10 to 20 minutes a day, if you can. Use your walker and assistance at all times.    Engage in social activities in your community and with your family and try to keep up with current events by reading the newspaper or watching the news.   As far as your medications are concerned, I would like to suggest no change.    As far as diagnostic testing: no new test.   I would like to see you back in 4 months, sooner if we need to. Please call us with any interim questions, concerns, problems, updates or refill requests.  Please also call us for any test results so we can go over those with you on the phone. Our nursing staff will answer any of your questions and relay your messages to me and also relay most of my messages to you.  Our phone number is [phone]. We also have an after hours call service for urgent matters and there is a physician on-call for urgent questions. For any emergencies you know to call 911 or go to the nearest emergency room.

## 2013-06-02 NOTE — Progress Notes (Signed)
Subjective:    Patient ID: JAKORI BURKETT is a 68 y.o. male.  HPI    Interim history:   Mr. 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 His Hx is confounded by a longstanding Hx of BPAD, chronic back disease, and recent V. fib arrest. I last saw him on 01/26/2013, at which time I did not change his medications. I asked him to use his walker at all times and recommended supervision with his mobilization. I felt that his history was complicated by his chronic bipolar disease, psychotropic medications, history of lithium use for about 18 years, chronic back disease, and advanced parkinsonism, with symptoms dating back to about 20 years ago. He is accompanied by his wife again today.   Today, he reports back pain, occasional lightheadedness, no recent fall, no recent illness and no recent change in his medication regimen. He now sees a different psychiatrist.   I first met him on 09/25/2012, and which time I asked him to take his Sinemet every 3 hours and reduce his baclofen. He had recently sustained a cardiac arrest and was hospitalized and admitted to the ICU through the emergency room. He had fallen on 06/30/2012 and sustained hypothermia and was found unresponsive by his wife in the morning. I did not make any other changes to his medications but was concerned about his multiple neurotropic and psychotropic medications. He has a complex medical history and his parkinsonism is complicated by orthostatic hypotension, and hallucinations. He no longer sees [name] in psychiatry. He c/o more LBP.  Today, he reports back pain, occasional  lightheadedness, no recent fall, no recent illness and no recent change in his medication regimen. He now sees a different psychiatrist.  Sx started on the R with tremor at the age of 74. He previously followed by [name] and was last seen by him on 05/26/2012, at which time [name] felt that the patient was stable and because of a history of hallucinations he did not increase his levodopa. The patient has associated memory loss as well. He currently resides at [facility]) in [place], [place] since 3/14.  His current medications are Seroquel 200 mg in the mornings and 500 mg at bedtime, Sinemet 25/100 mg strength one tablet at 7 AM, 11 AM, 12, 3 PM, 5 PM and bedtime. Clonazepam 1 mg strength half a tablet at 7 AM and bedtime, baby aspirin, Zoloft 200 mg at night, Lamictal 150 mg daily, amlodipine, Pravachol, baclofen, Synthroid, benztropine 1 mg 3 times a day, MiraLax, Mestinon 60 mg 3 times a day, omeprazole, ibuprofen as needed. He has when necessary tramadol. He is getting physical therapy and occupational therapy.  He has bipolar disease on lithium therapy in the past between [place], arthritis with chronic lower back pain, gait disorder and postural hypotension. He was diagnosed with Parkinson's disease in the distant past versus parkinsonism because of his medications. His history is complicated by chronic constipation, memory loss, bladder incontinence, bizarre dreams, hallucinations, spinal stenosis. He has received epidural steroid injections. He needs assistance with his ADLs. He uses a wheelchair cane or walker. 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 In January 2014 his MMSE was 22, clock drawing was  3, animal fluency was 10. He is supposed to be on O2 24/7 for COPD.   His Past Medical History Is Significant For: Past Medical History  Diagnosis Date  . Parkinson disease   . Bipolar 1 disorder   . HTN (hypertension)   . Hyperlipemia   . Stroke   .  COPD (chronic obstructive pulmonary disease)   . Arthritis   . Anxiety   . History of cardiac arrest 09/25/2012  . Orthostatic hypotension 09/25/2012    His Past Surgical History Is Significant For: Past Surgical History  Procedure Laterality Date  . Colonoscopy  05/15/2006    Normal rectum/Long tortuous colon/Normal terminal ileum/ Polyps in the left and right colon resected as described above  . Esophagogastroduodenoscopy  02/05/2006    Normal stomach, normal D-I and D-II/ Four-quadrant distal esophageal erosions, consistent with moderately severe erosive reflux esophagitis, otherwise normal tubular esophagus  . Esophagogastroduodenoscopy  09/06/2009    normal esophagus/small hiatal hernia. s/p 63F  . Colonoscopy  09/06/2009    normal rectum, long tortuous colon. repeat surveillance TCS 09/2014  . Esophageal dilation      ready for 3rd time    His Family History Is Significant For: Family History  Problem Relation Age of Onset  . Bipolar disorder Father   . Anxiety disorder Mother   . OCD Mother   . Dementia Mother   . Alcohol abuse Sister   . Anxiety disorder Sister   . Dementia Sister   . ADD / ADHD Neg Hx   . Drug abuse Neg Hx   . Depression Neg Hx   . Paranoid behavior Neg Hx   . Schizophrenia Neg Hx   . Seizures Neg Hx   . Sexual abuse Neg Hx   . Physical abuse Neg Hx     His Social History Is Significant For: History   Social History  . Marital Status: Married    Spouse Name: N/A    Number of Children: N/A  . Years of Education: N/A   Social History Main Topics  . Smoking status: Former Research scientist (life sciences)  . Smokeless tobacco: None  . Alcohol Use: No     Comment: remote heavy use  . Drug Use: No  . Sexual Activity: None   Other Topics Concern  . None   Social History Narrative  . None    His Allergies Are:  Allergies  Allergen Reactions  . Haloperidol Lactate     REACTION: Paranoid and withdrawal  . Pramipexole Dihydrochloride     REACTION: unknown  reaction  . Pregabalin     REACTION: unknown reaction  :   His Current Medications Are:  Outpatient Encounter Prescriptions as of 06/02/2013  Medication Sig  . amLODipine (NORVASC) 10 MG tablet Take 10 mg by mouth daily.  Marland Kitchen aspirin EC 81 MG tablet Take 81 mg by mouth daily.  . baclofen (LIORESAL) 10 MG tablet Take 1 tablet (10 mg total) by mouth 4 (four) times daily as needed (muscle spasms).  . benztropine (COGENTIN) 1 MG tablet Take 1 tablet (1 mg total) by mouth 3 (three) times daily.  . bisacodyl (DULCOLAX) 5 MG EC tablet Take 2 tablets (10 mg total) by mouth daily as needed.  . carbidopa-levodopa (SINEMET) 25-100 MG per tablet Take 1 tablet by mouth See admin instructions. Take 1 tablet at 7am, 1 tablet at 11am, 1 tablet at noon, 1 tablet at 3pm, 1 tablet at 5pm, 1 tablet at bedtime.  . clonazePAM (KLONOPIN) 0.5 MG tablet  Take 1 tablet (0.5 mg total) by mouth 3 (three) times daily as needed (agita).  . hydrALAZINE (APRESOLINE) 25 MG tablet Take 1 tablet (25 mg total) by mouth every 6 (six) hours.  Marland Kitchen lamoTRIgine (LAMICTAL) 150 MG tablet Take 1 tablet (150 mg total) by mouth daily. 11 am  . levothyroxine (SYNTHROID, LEVOTHROID) 112 MCG tablet 112 mcg daily.  Marland Kitchen lubiprostone (AMITIZA) 24 MCG capsule Take 24 mcg by mouth 2 (two) times daily.  . metoprolol (LOPRESSOR) 50 MG tablet Take 1 tablet (50 mg total) by mouth 2 (two) times daily.  Marland Kitchen omeprazole (PRILOSEC) 20 MG capsule Take 20 mg by mouth 2 (two) times daily.   . polyethylene glycol (MIRALAX / GLYCOLAX) packet Take 17 g by mouth daily.  . pravastatin (PRAVACHOL) 20 MG tablet Take 1 tablet (20 mg total) by mouth daily.  Marland Kitchen pyridostigmine (MESTINON) 60 MG tablet Take 60 mg by mouth 2 (two) times daily.   . QUEtiapine (SEROQUEL XR) 300 MG 24 hr tablet Take 500 mg by mouth at bedtime. Take with one 200 mg tablet.  . QUEtiapine (SEROQUEL) 200 MG tablet Take 200 mg by mouth daily. At noon and at bedtime.  . sertraline (ZOLOFT) 100 MG tablet  Take 2 tablets (200 mg total) by mouth at bedtime.  . traMADol (ULTRAM) 50 MG tablet Take 50 mg by mouth at bedtime.   :  Review of Systems:  Out of a complete 14 point review of systems, all are reviewed and negative with the exception of these symptoms as listed below: Review of Systems  Constitutional: Positive for chills and fatigue.  HENT: Positive for ear discharge, ear pain, hearing loss, rhinorrhea, tinnitus and trouble swallowing.   Eyes: Positive for photophobia, discharge, redness, itching and visual disturbance (diplopia).  Respiratory: Positive for choking, chest tightness and shortness of breath.   Cardiovascular: Positive for chest pain, palpitations and leg swelling.       Murmur  Gastrointestinal: Positive for abdominal pain, constipation and rectal pain.  Endocrine: Positive for heat intolerance and polydipsia.       Flushing  Genitourinary: Positive for dysuria, urgency, frequency, decreased urine volume, discharge, enuresis and difficulty urinating.  Musculoskeletal: Positive for arthralgias, back pain, gait problem, joint swelling, myalgias, neck pain and neck stiffness.  Skin: Negative.   Allergic/Immunologic: Negative.   Neurological: Positive for dizziness, tremors, speech difficulty, weakness and numbness.       Memory loss  Hematological: Bruises/bleeds easily.  Psychiatric/Behavioral: Positive for confusion, sleep disturbance (snoring, sleep talking, frequent waking, daytime sleepiness), dysphoric mood, decreased concentration and agitation. The patient is nervous/anxious.     Objective:  Neurologic Exam  Physical Exam Physical Examination:   Filed Vitals:   06/02/13 1148  BP: 94/56  Pulse: 61  Temp: 98.5 F (36.9 C)    General Examination: The patient is a very pleasant 68 y.o. male in no acute distress. He is situated in his wheelchair. He is frail appearing. He did not bring his oxygen today.  HEENT: Normocephalic, atraumatic, pupils are equal,  round and reactive to light and accommodation. Extraocular tracking shows moderate saccadic breakdown without nystagmus noted. There is limitation to upper gaze. There is mild decrease in eye blink rate. Hearing is intact. Face is symmetric with moderate facial masking and normal facial sensation. There is no lip, neck or jaw tremor. Neck is moderately rigid with decrease passive ROM. There are no carotid bruits on auscultation. Oropharynx exam reveals moderate mouth dryness. No significant airway crowding is noted.  Mallampati is class II. Tongue protrudes centrally and palate elevates symmetrically.   There is no drooling.   Chest: is clear to auscultation without wheezing, rhonchi or crackles noted.  Heart: sounds are regular and normal without murmurs, rubs or gallops noted.   Abdomen: is soft, non-tender and non-distended with normal bowel sounds appreciated on auscultation.  Extremities: There is no pitting edema in the distal lower extremities bilaterally. There are no varicose veins.  Skin: is warm and dry with no trophic changes noted. Age-related changes are noted on the skin.   Musculoskeletal: exam reveals no obvious joint deformities, tenderness, joint swelling or erythema.  Neurologically:  Mental status: The patient is awake and alert, paying good attention. He is able to partially provide the history. His wife provides details. He is oriented to: person, place, situation, day of week, month of year and year. His memory, attention, language and knowledge are impaired. There is no aphasia, agnosia, apraxia or anomia. There is a moderate degree of bradyphrenia. Speech is moderately hypophonic with mild dysarthria noted. Mood is congruent and affect is blunted.   Cranial nerves are as described above under HEENT exam. In addition, shoulder shrug is normal with equal shoulder height noted.  Motor exam: Normal bulk, and strength for age is noted. There are no dyskinesias noted.   Tone  is mildly rigid with absence of cogwheeling. There is overall moderate bradykinesia. There is no drift or rebound.  There is a moderate resting tremor in the right upper extremity. The tremor is intermittent.  Romberg is not tested.   Reflexes are 1+ in the upper extremities and 1+ in the lower extremities.   Fine motor skills exam: Finger taps are moderately impaired on the right and moderately impaired on the left. Hand movements are moderately impaired on the right and moderately impaired on the left. RAP (rapid alternating patting) is moderately impaired on the right and moderately impaired on the left. Foot taps are moderately impaired on the right and moderately impaired on the left. Foot agility (in the form of heel stomping) is moderately impaired on the right and moderately impaired on the left.    Cerebellar testing shows no dysmetria or intention tremor on finger to nose testing.    Sensory exam is intact to light touch.   Gait, station and balance: I did not have him stand or walk for me today as he did not bring his walker and is not stable to walk without it. He is again noted to lean towards the right and he has marked kyphosis in his upper back.   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 R sided Parkinson's disease. He appears to be generally speaking stable. He has a complicated history with longstanding Hx of bipolar, back disease, and Hx of V. fib arrest last year. He has fallen in the past and is in SNF. He is advised to use his walker at all times and needs supervision with mobilization at all times. I did not suggest changing his medication. His clinical picture is certainly complicated by his chronic bipolar disease, long-standing history of high dose psychotropic medications, history of lithium use for about 18 years, back disease, and advanced Parkinson's, with symptoms dating back to  about 20 years ago. He has complications with orthostatic hypotension and hallucinations. We talked about medical treatments and non-pharmacological approaches. His blood pressure was low today and I  encouraged him to drink more water. His wife adds that he does not like the [place] water so she buys them bottled water. We talked about maintaining a healthy lifestyle in general. I encouraged the patient to eat healthy, and keep well hydrated, to keep a scheduled bedtime and wake time routine. He is at risk for falling. I answered all their questions today and the patient and his wife were in agreement with the above outlined plan. I would like to see the patient back in 4 months, sooner if the need arises and encouraged them to call with any interim questions, concerns, problems or updates and refill requests. I also filled out the consultation sheet from his skilled nursing facility.

## 2013-10-14 ENCOUNTER — Encounter: Payer: Self-pay | Admitting: Neurology

## 2013-10-14 ENCOUNTER — Encounter (INDEPENDENT_AMBULATORY_CARE_PROVIDER_SITE_OTHER): Payer: Self-pay

## 2013-10-14 ENCOUNTER — Ambulatory Visit (INDEPENDENT_AMBULATORY_CARE_PROVIDER_SITE_OTHER): Payer: PRIVATE HEALTH INSURANCE | Admitting: Neurology

## 2013-10-14 VITALS — BP 108/65 | HR 57 | Temp 97.0°F | Ht 67.0 in | Wt 197.0 lb

## 2013-10-14 DIAGNOSIS — Z9181 History of falling: Secondary | ICD-10-CM

## 2013-10-14 DIAGNOSIS — Z8674 Personal history of sudden cardiac arrest: Secondary | ICD-10-CM

## 2013-10-14 DIAGNOSIS — I951 Orthostatic hypotension: Secondary | ICD-10-CM

## 2013-10-14 DIAGNOSIS — F319 Bipolar disorder, unspecified: Secondary | ICD-10-CM

## 2013-10-14 DIAGNOSIS — R443 Hallucinations, unspecified: Secondary | ICD-10-CM

## 2013-10-14 DIAGNOSIS — G20 Parkinson's disease: Secondary | ICD-10-CM

## 2013-10-14 NOTE — Patient Instructions (Signed)
Drink more water! Use your walker at all times.  Change position slowly and continue with PT and the exercises you were shown.  Follow up in 6 months, as you have remained fairly stable. No changes in medication from my end of things.

## 2013-10-14 NOTE — Progress Notes (Signed)
Subjective:    Patient ID: NUR KRASINSKI is a 68 y.o. male.  HPI    Interim history:   Mr. Emond is a 68 year old right-handed gentleman with an underlying medical history bipolar disease, stroke in 2007, arthritis, COPD, degenerative cervical and lumbar spine disease, V. 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 He resides at a skilled nursing facility, [facility] [place] and is accompanied by his wife again today. I last saw him on 06/02/2013, at which time I did not change medications and advised him to use his walker at all times. His clinical picture is complicated because of chronic bipolar disease with chronic treatment with psychotropic medications including history of lithium use and current use of Seroquel. Symptoms of parkinsonism date back about 20 years ago. I encouraged him to drink more water as his blood pressure was low.  Today, his wife reports, that he fell this past weekend, some 5 days ago, around [place] in his room and the fall woke up his roommate. He must have landed on his buttocks or his thigh. He did not hit his head, or have any loss of consciousness but the sound was loud enough to wake up his roommate. It took 5 people from the skilled nursing facility to get him up. He did complain of some soreness in his but talk to his wife the next day which was a Sunday. Otherwise, he has remained stable. He has occasional hallucinations and less dream activity in his sleep. His records indicate occasional delusions, particularly paranoid delusions. His wife indicates that when he has fallen in the past he was afraid that he would be "written up" by the staff, and he did not always understand that writing up a fall does not have a negative implication on him but is part of the protocol, and is for the  greater good for the patients and their safety.   I saw him on 01/26/2013, at which time I did not change his medications. I asked him to use his walker at all times and recommended supervision with his mobilization. I felt that his history was complicated by his chronic bipolar disease, psychotropic medications, history of lithium use for about 18 years, chronic back disease, and advanced parkinsonism, with symptoms dating back to about 20 years ago.  I first met him on 09/25/2012, at which time I asked him to take his Sinemet every 3 hours and reduce his baclofen. He had sustained a cardiac arrest and was hospitalized and admitted to the ICU through the emergency room. He had fallen on 06/30/2012 and sustained hypothermia and was found unresponsive by his wife in the morning. I did not make any other changes to his medications but was concerned about his multiple neurotropic and psychotropic medications. He has a complex medical history and his parkinsonism is complicated by orthostatic hypotension, and hallucinations. He no longer sees [name] in psychiatry.  Sx started on the R with tremor at the age of 65. He previously followed by [name] and was last seen by him on 05/26/2012, at which time [name] felt that the patient was stable and because of a history of hallucinations he did not increase his levodopa. The patient has associated memory loss as well. He currently resides at [facility]) in [place], [place] since 3/14.  He has bipolar disease on lithium therapy in the past  between [place], arthritis with chronic lower back pain, gait disorder and postural hypotension. He was diagnosed with Parkinson's disease in the distant past versus parkinsonism because of his medications. His history is complicated by chronic constipation, memory loss, bladder incontinence, bizarre dreams, hallucinations, spinal stenosis. He has received epidural steroid injections. He needs assistance with his ADLs.  He uses a wheelchair cane or walker. 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 In January 2014 his MMSE was 22, clock drawing was 3, animal fluency was 10. He is supposed to be on O2 24/7 for COPD.   His Past Medical History Is Significant For: Past Medical History  Diagnosis Date  . Parkinson disease   . Bipolar 1 disorder   . HTN (hypertension)   . Hyperlipemia   . Stroke   . COPD (chronic obstructive pulmonary disease)   . Arthritis   . Anxiety   . History of cardiac arrest 09/25/2012  . Orthostatic hypotension 09/25/2012    His Past Surgical History Is Significant For: Past Surgical History  Procedure Laterality Date  . Colonoscopy  05/15/2006    Normal rectum/Long tortuous colon/Normal terminal ileum/ Polyps in the left and right colon resected as described above  . Esophagogastroduodenoscopy  02/05/2006    Normal stomach, normal D-I and D-II/ Four-quadrant distal esophageal erosions, consistent with moderately severe erosive reflux esophagitis, otherwise normal tubular esophagus  . Esophagogastroduodenoscopy  09/06/2009    normal esophagus/small hiatal hernia. s/p 62F  . Colonoscopy  09/06/2009    normal rectum, long tortuous colon. repeat surveillance TCS 09/2014  . Esophageal dilation      ready for 3rd time    His Family History Is Significant For: Family History  Problem Relation Age of Onset  . Bipolar disorder Father   . Anxiety disorder Mother   . OCD Mother   . Dementia Mother   . Alcohol abuse Sister   . Anxiety disorder Sister   . Dementia Sister   . ADD / ADHD Neg Hx   . Drug abuse Neg Hx   . Depression Neg Hx   . Paranoid behavior Neg Hx   . Schizophrenia Neg Hx   . Seizures Neg Hx   . Sexual abuse Neg Hx   . Physical abuse Neg Hx     His Social History Is Significant For: History   Social History  . Marital Status: Married    Spouse Name: N/A    Number of Children: N/A  . Years of Education: N/A   Social History  Main Topics  . Smoking status: Former Research scientist (life sciences)  . Smokeless tobacco: None  . Alcohol Use: No     Comment: remote heavy use  . Drug Use: No  . Sexual Activity: None   Other Topics Concern  . None   Social History Narrative  . None    His Allergies Are:  Allergies  Allergen Reactions  . Haloperidol Lactate     REACTION: Paranoid and withdrawal  . Pramipexole Dihydrochloride     REACTION: unknown reaction  . Pregabalin     REACTION: unknown reaction  :   His Current Medications Are:  Outpatient Encounter Prescriptions as of 10/14/2013  Medication Sig  . amLODipine (NORVASC) 10 MG tablet Take 10 mg by mouth daily.  Marland Kitchen aspirin EC 81 MG tablet Take 81 mg by mouth daily.  . baclofen (LIORESAL) 10 MG tablet Take 1 tablet (10 mg total) by mouth 4 (four) times daily as needed (muscle  spasms).  . benztropine (COGENTIN) 1 MG tablet Take 1 tablet (1 mg total) by mouth 3 (three) times daily.  . bisacodyl (DULCOLAX) 5 MG EC tablet Take 2 tablets (10 mg total) by mouth daily as needed.  . carbidopa-levodopa (SINEMET) 25-100 MG per tablet Take 1 tablet by mouth See admin instructions. Take 1 tablet at 7am, 1 tablet at 11am, 1 tablet at noon, 1 tablet at 3pm, 1 tablet at 5pm, 1 tablet at bedtime.  . clonazePAM (KLONOPIN) 0.5 MG tablet Take 1 tablet (0.5 mg total) by mouth 3 (three) times daily as needed (agita).  . hydrALAZINE (APRESOLINE) 25 MG tablet Take 1 tablet (25 mg total) by mouth every 6 (six) hours.  Marland Kitchen lamoTRIgine (LAMICTAL) 150 MG tablet Take 1 tablet (150 mg total) by mouth daily. 11 am  . levothyroxine (SYNTHROID, LEVOTHROID) 112 MCG tablet 112 mcg daily.  Marland Kitchen lubiprostone (AMITIZA) 24 MCG capsule Take 24 mcg by mouth 2 (two) times daily.  . metoprolol (LOPRESSOR) 50 MG tablet Take 1 tablet (50 mg total) by mouth 2 (two) times daily.  Marland Kitchen omeprazole (PRILOSEC) 20 MG capsule Take 20 mg by mouth 2 (two) times daily.   Marland Kitchen PATANOL 0.1 % ophthalmic solution Place 2 drops into both eyes daily.   . polyethylene glycol (MIRALAX / GLYCOLAX) packet Take 17 g by mouth daily.  . pravastatin (PRAVACHOL) 20 MG tablet Take 1 tablet (20 mg total) by mouth daily.  Marland Kitchen pyridostigmine (MESTINON) 60 MG tablet Take 60 mg by mouth 2 (two) times daily.   . QUEtiapine (SEROQUEL XR) 300 MG 24 hr tablet Take 500 mg by mouth at bedtime. Take with one 200 mg tablet.  . sertraline (ZOLOFT) 100 MG tablet Take 2 tablets (200 mg total) by mouth at bedtime.  . traMADol (ULTRAM) 50 MG tablet Take 50 mg by mouth at bedtime.   :  Review of Systems:  Out of a complete 14 point review of systems, all are reviewed and negative with the exception of these symptoms as listed below:  Review of Systems  Constitutional: Positive for fever, chills and fatigue.  HENT: Positive for ear discharge, facial swelling, hearing loss, rhinorrhea, tinnitus and trouble swallowing.   Eyes: Positive for photophobia, discharge, redness and itching.  Respiratory: Positive for cough, choking, chest tightness, shortness of breath and wheezing.   Cardiovascular: Positive for chest pain, palpitations and leg swelling.  Gastrointestinal: Positive for vomiting, abdominal pain, diarrhea, constipation and abdominal distention.  Endocrine: Positive for heat intolerance.       Flushing  Genitourinary: Positive for dysuria, urgency, frequency and testicular pain.  Musculoskeletal: Positive for arthralgias, back pain, gait problem, joint swelling, myalgias and neck stiffness.  Skin: Negative.   Allergic/Immunologic: Negative.   Neurological: Positive for dizziness, tremors, facial asymmetry, speech difficulty, weakness, numbness and headaches.       Memory loss  Hematological: Bruises/bleeds easily.  Psychiatric/Behavioral: Positive for confusion and dysphoric mood. The patient is nervous/anxious.     Objective:  Neurologic Exam  Physical Exam Physical Examination:   Filed Vitals:   10/14/13 1118  BP: 108/65  Pulse: 57  Temp: 97 F  (36.1 C)    General Examination: The patient is a very pleasant 68 y.o. male in no acute distress. He is situated in his wheelchair. He is frail appearing.   HEENT: Normocephalic, atraumatic, pupils are equal, round and reactive to light and accommodation. Extraocular tracking shows moderate saccadic breakdown without nystagmus noted. There is limitation to upper gaze. There is  mild decrease in eye blink rate. Hearing is intact. Face is symmetric with moderate facial masking and normal facial sensation. There is no lip, neck or jaw tremor. Neck is moderately rigid with decrease passive ROM. There is a L neck tilt and mild R neck turn. There are no carotid bruits on auscultation. Oropharynx exam reveals moderate mouth dryness. No significant airway crowding is noted. Mallampati is class II. Tongue protrudes centrally and palate elevates symmetrically. There is no drooling.   Chest: is clear to auscultation without wheezing, rhonchi or crackles noted.  Heart: sounds are regular and normal without murmurs, rubs or gallops noted.   Abdomen: is soft, non-tender and non-distended with normal bowel sounds appreciated on auscultation.  Extremities: There is no pitting edema in the distal lower extremities bilaterally. There are no varicose veins.  Skin: is warm and dry with no trophic changes noted. Age-related changes are noted on the skin.   Musculoskeletal: exam reveals no obvious joint deformities, tenderness, joint swelling or erythema. I did not see any bruising on his thighs. He's not particularly tender over his back.  Neurologically:  Mental status: The patient is awake and alert, paying good attention. He is able to partially provide the history. His wife provides most of the history. He is oriented to: person, place, situation, day of week, month of year and year. His memory, attention, language and knowledge are impaired. There is no aphasia, agnosia, apraxia or anomia. There is a moderate  degree of bradyphrenia. Speech is moderately hypophonic with mild to moderate dysarthria noted. Affect is blunted.   Cranial nerves are as described above under HEENT exam. In addition, shoulder shrug is normal with equal shoulder height noted.  Motor exam: Normal bulk, and strength for age is noted. There are no dyskinesias noted.   Tone is mildly rigid with absence of cogwheeling. There is overall moderate bradykinesia. There is no drift or rebound.  There is a moderate resting tremor in the right upper extremity. The tremor is intermittent.  Romberg is not tested.   Reflexes are 1+ in the upper extremities and trace in the lower extremities.   Fine motor skills exam: Finger taps are moderately impaired on the right and moderately impaired on the left. Hand movements are moderately impaired on the right and moderately impaired on the left. RAP (rapid alternating patting) is moderately impaired on the right and moderately impaired on the left. Foot taps are moderate to severely impaired b/l, foot agility (in the form of heel stomping) is moderately impaired on the right and moderately impaired on the left.    Cerebellar testing shows no dysmetria or intention tremor on finger to nose testing.    Sensory exam is intact to light touch, PP, temperature in the UEs and LEs.   Gait, station and balance: I did not have him stand or walk for me today as he did not bring his walker and is not stable to walk without it. He is again noted to lean towards the right and he has kyphosis in his upper back.   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 fib arrest last year. He appears to be  generally speaking stable again today, but did fall recently with no injury reported per wife. He has fallen in the past. He continues to reside at [facility] in [place], [place] and receives physical therapy which his wife believes is quite helpful and he endorses this. He is advised to use his walker at all times and needs supervision with mobilization at all times. I did not suggest changing his medication today. His clinical picture is certainly complicated by his chronic bipolar disease, long-standing history of high dose psychotropic medications, history of lithium use for about 18 years, back disease, and advanced Parkinson's, with symptoms dating back to about 20 years ago and some lateralization to the right especially with respect to his rest tremor. He has complications with orthostatic hypotension and hallucinations. We talked about medical treatments and non-pharmacological approaches. I encouraged him to drink more water, change positions slowly and always use his walker. He is advised to continue to follow the exercises he was shown by his physical therapist. He continues to be at risk for falling. I answered all their questions today and the patient and his wife were in agreement with the above outlined plan. I would like to see the patient back in 6 months, sooner if the need arises and encouraged them to call with any interim questions, concerns, problems or updates. I also filled out the consultation sheet from his skilled nursing facility.

## 2013-11-04 ENCOUNTER — Telehealth: Payer: Self-pay | Admitting: Neurology

## 2013-11-04 NOTE — Telephone Encounter (Signed)
April Reed from [facility] calling to schedule a sooner appointment for patient, states that he has had 4 falls and his gait and shuffle is getting worse. Please return call and advise.

## 2013-11-08 NOTE — Telephone Encounter (Signed)
Pt was seen recently and we talked about his falls and his fall risk. Please advise [facility] that he may need adjustment of his sedating medication, which is for his bipolar and there may be medical reasons for his falls as well. Has he seen his psychiatrist and his PCP recently? If not, he would benefit from evaluation from those ends as well.

## 2013-11-08 NOTE — Telephone Encounter (Signed)
Seen pt back in 10-14-13.

## 2013-11-09 NOTE — Telephone Encounter (Signed)
LM for April to return call. 

## 2013-11-11 NOTE — Telephone Encounter (Signed)
Please ask them to use the wheelchair at all times. He may not be safe to use the walker at all anymore. Okay to schedule an appointment with Larita FifeLynn for sooner appointment.

## 2013-11-11 NOTE — Telephone Encounter (Signed)
I called and relayed the message re: using wheelchair at all times as having many falls.  (may not be safe to use walker at all anymore).  Can make appt with Heide GuileLynn Lam, NP (Dr.Athar's NP) sooner appt.  Ok for front staff to make appt with [facility].

## 2013-11-11 NOTE — Telephone Encounter (Signed)
Spoke to April Reed, Charity fundraiserN at [facility].  Physical Therapy requesting sooner appt for pt, 5 falls since last seen, relating to ambulation and transfers. (gait and shuffling gait worse).  Stable with bipolar, does not seem issue with sedation.

## 2013-11-17 ENCOUNTER — Ambulatory Visit: Payer: PRIVATE HEALTH INSURANCE | Admitting: Nurse Practitioner

## 2013-11-18 ENCOUNTER — Encounter (INDEPENDENT_AMBULATORY_CARE_PROVIDER_SITE_OTHER): Payer: Self-pay

## 2013-11-18 ENCOUNTER — Encounter: Payer: Self-pay | Admitting: Neurology

## 2013-11-18 ENCOUNTER — Ambulatory Visit (INDEPENDENT_AMBULATORY_CARE_PROVIDER_SITE_OTHER): Payer: PRIVATE HEALTH INSURANCE | Admitting: Neurology

## 2013-11-18 VITALS — BP 90/60 | HR 60 | Temp 97.7°F | Ht 64.0 in

## 2013-11-18 DIAGNOSIS — Z8674 Personal history of sudden cardiac arrest: Secondary | ICD-10-CM

## 2013-11-18 DIAGNOSIS — Z9181 History of falling: Secondary | ICD-10-CM

## 2013-11-18 DIAGNOSIS — F319 Bipolar disorder, unspecified: Secondary | ICD-10-CM

## 2013-11-18 DIAGNOSIS — I951 Orthostatic hypotension: Secondary | ICD-10-CM

## 2013-11-18 DIAGNOSIS — R443 Hallucinations, unspecified: Secondary | ICD-10-CM

## 2013-11-18 DIAGNOSIS — G20 Parkinson's disease: Secondary | ICD-10-CM

## 2013-11-18 NOTE — Progress Notes (Signed)
Subjective:    Patient ID: Jeremy Carr is a 68 y.o. male.  HPI    Interim history:  Mr. Jeremy Carr is a 68 year old right-handed gentleman with an underlying medical history bipolar disease, stroke in 2007, arthritis, COPD, degenerative cervical and lumbar spine disease, and V. 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 He resides at a skilled nursing facility, [name] and is accompanied by his wife again today. He presents for a sooner than scheduled appointment because of recurrent falls. I last saw him on 10/14/2013 at which time I felt he had generally speaking remained fairly stable but I felt that his situation was complicated and that he was at continued risk of falling. He was advised to always use his walker, drink more water and change positions slowly. He was advised to follow the exercises he was shown by his physical therapist. His wife reported that he fell recently in his room. It took 5 people from the skilled nursing facility to get him up. He did complain of some soreness in his buttock to his wife the next day which was a Sunday. His reported occasional delusions, particularly paranoid delusions. His wife indicated that when he had fallen in the past he was afraid that he would be "written up" by the staff, and he did not always understand that writing up a fall does not have a negative implication on him but is part of the protocol, and is for the greater good for the patients and their safety. We had made a sooner appointment for him on 11/17/2013 for which he no showed.  Today, his wife reports that most of his falls occur at night. She does not feel he has progressed per se in his PD. He had some adjustments made to his WC, including moving up the breaks to where he is able to use them and his  WC arm rest was increased in height.   I saw him on 06/02/2013, at which time I did not change medications and advised him to use his walker at all times. His clinical picture was complicated because of chronic bipolar disease with chronic treatment with psychotropic medications including history of lithium use and current use of Seroquel. Symptoms of parkinsonism date back about 20 years ago. I encouraged him to drink more water as his blood pressure was low.  I saw him on 01/26/2013, at which time I did not change his medications. I asked him to use his walker at all times and recommended supervision with his mobilization. I felt that his history was complicated by his chronic bipolar disease, psychotropic medications, history of lithium use for about 18 years, chronic back disease, and advanced parkinsonism, with symptoms dating back to about 20 years ago.  I first met him on 09/25/2012, at which time I asked him to take his Sinemet every 3 hours and reduce his baclofen. He had sustained a cardiac arrest and was hospitalized and admitted to the ICU through the emergency room. He had fallen on 06/30/2012 and sustained hypothermia and was found unresponsive by his wife in the morning. I did not make any other changes to his medications but was concerned about his multiple neurotropic and psychotropic medications. He has a complex medical history and his parkinsonism is complicated by orthostatic hypotension, and hallucinations. He no longer sees [name] in psychiatry.  Sx started on  the R with tremor at the age of 40. He previously followed by [name] and was last seen by him on 05/26/2012, at which time [name] felt that the patient was stable and because of a history of hallucinations he did not increase his levodopa. The patient has associated memory loss as well. He currently resides at [facility] in [place], [place] since 3/14.  He has bipolar disease on lithium therapy in the past between  [place], arthritis with chronic lower back pain, gait disorder and postural hypotension. He was diagnosed with Parkinson's disease in the distant past versus parkinsonism because of his medications. His history is complicated by chronic constipation, memory loss, bladder incontinence, bizarre dreams, hallucinations, spinal stenosis. He has received epidural steroid injections. He needs assistance with his ADLs. He uses a wheelchair cane or walker. 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 In January 2014 his MMSE was 22, clock drawing was 3, animal fluency was 10. He is supposed to be on O2 24/7 for COPD.   His Past Medical History Is Significant For: Past Medical History  Diagnosis Date  . Parkinson disease   . Bipolar 1 disorder   . HTN (hypertension)   . Hyperlipemia   . Stroke   . COPD (chronic obstructive pulmonary disease)   . Arthritis   . Anxiety   . History of cardiac arrest 09/25/2012  . Orthostatic hypotension 09/25/2012    His Past Surgical History Is Significant For: Past Surgical History  Procedure Laterality Date  . Colonoscopy  05/15/2006    Normal rectum/Long tortuous colon/Normal terminal ileum/ Polyps in the left and right colon resected as described above  . Esophagogastroduodenoscopy  02/05/2006    Normal stomach, normal D-I and D-II/ Four-quadrant distal esophageal erosions, consistent with moderately severe erosive reflux esophagitis, otherwise normal tubular esophagus  . Esophagogastroduodenoscopy  09/06/2009    normal esophagus/small hiatal hernia. s/p 3F  . Colonoscopy  09/06/2009    normal rectum, long tortuous colon. repeat surveillance TCS 09/2014  . Esophageal dilation      ready for 3rd time    His Family History Is Significant For: Family History  Problem Relation Age of Onset  . Bipolar disorder Father   . Anxiety disorder Mother   . OCD Mother   . Dementia Mother   . Alcohol abuse Sister   . Anxiety disorder  Sister   . Dementia Sister   . ADD / ADHD Neg Hx   . Drug abuse Neg Hx   . Depression Neg Hx   . Paranoid behavior Neg Hx   . Schizophrenia Neg Hx   . Seizures Neg Hx   . Sexual abuse Neg Hx   . Physical abuse Neg Hx     His Social History Is Significant For: History   Social History  . Marital Status: Married    Spouse Name: N/A    Number of Children: N/A  . Years of Education: N/A   Social History Main Topics  . Smoking status: Former Research scientist (life sciences)  . Smokeless tobacco: None  . Alcohol Use: No     Comment: remote heavy use  . Drug Use: No  . Sexual Activity: None   Other Topics Concern  . None   Social History Narrative  . None    His Allergies Are:  Allergies  Allergen Reactions  . Haloperidol Lactate     REACTION: Paranoid and withdrawal  . Pramipexole Dihydrochloride     REACTION:  unknown reaction  . Pregabalin     REACTION: unknown reaction  :   His Current Medications Are:  Outpatient Encounter Prescriptions as of 11/18/2013  Medication Sig  . amLODipine (NORVASC) 10 MG tablet Take 10 mg by mouth daily.  Marland Kitchen aspirin EC 81 MG tablet Take 81 mg by mouth daily.  . baclofen (LIORESAL) 10 MG tablet Take 1 tablet (10 mg total) by mouth 4 (four) times daily as needed (muscle spasms).  . benztropine (COGENTIN) 1 MG tablet Take 1 tablet (1 mg total) by mouth 3 (three) times daily.  . bisacodyl (DULCOLAX) 5 MG EC tablet Take 2 tablets (10 mg total) by mouth daily as needed.  . carbidopa-levodopa (SINEMET) 25-100 MG per tablet Take 1 tablet by mouth See admin instructions. Take 1 tablet at 7am, 1 tablet at 11am, 1 tablet at noon, 1 tablet at 3pm, 1 tablet at 5pm, 1 tablet at bedtime.  . clonazePAM (KLONOPIN) 0.5 MG tablet Take 1 tablet (0.5 mg total) by mouth 3 (three) times daily as needed (agita).  . hydrALAZINE (APRESOLINE) 25 MG tablet Take 1 tablet (25 mg total) by mouth every 6 (six) hours.  Marland Kitchen lamoTRIgine (LAMICTAL) 150 MG tablet Take 1 tablet (150 mg total) by mouth  daily. 11 am  . levothyroxine (SYNTHROID, LEVOTHROID) 112 MCG tablet 112 mcg daily.  Marland Kitchen lubiprostone (AMITIZA) 24 MCG capsule Take 24 mcg by mouth 2 (two) times daily.  . metoprolol (LOPRESSOR) 50 MG tablet Take 1 tablet (50 mg total) by mouth 2 (two) times daily.  Marland Kitchen omeprazole (PRILOSEC) 20 MG capsule Take 20 mg by mouth 2 (two) times daily.   Marland Kitchen PATANOL 0.1 % ophthalmic solution Place 2 drops into both eyes daily.  . polyethylene glycol (MIRALAX / GLYCOLAX) packet Take 17 g by mouth daily.  . pravastatin (PRAVACHOL) 20 MG tablet Take 1 tablet (20 mg total) by mouth daily.  Marland Kitchen pyridostigmine (MESTINON) 60 MG tablet Take 60 mg by mouth 2 (two) times daily.   . QUEtiapine (SEROQUEL XR) 300 MG 24 hr tablet Take 500 mg by mouth at bedtime. Take with one 200 mg tablet.  . sertraline (ZOLOFT) 100 MG tablet Take 2 tablets (200 mg total) by mouth at bedtime.  . traMADol (ULTRAM) 50 MG tablet Take 50 mg by mouth at bedtime.   :  Review of Systems:  Out of a complete 14 point review of systems, all are reviewed and negative with the exception of these symptoms as listed below:  Review of Systems  Constitutional: Positive for fever, chills and fatigue.  HENT: Positive for ear discharge, hearing loss, rhinorrhea, tinnitus and trouble swallowing.   Eyes: Positive for photophobia, discharge, redness and visual disturbance (diplopia).  Respiratory: Positive for chest tightness and shortness of breath.   Cardiovascular: Positive for chest pain and leg swelling.  Gastrointestinal: Positive for abdominal pain, diarrhea, constipation and abdominal distention.       Incontinence  Endocrine: Positive for heat intolerance and polydipsia.       Flushing  Genitourinary: Positive for dysuria, urgency, frequency, enuresis, difficulty urinating and testicular pain.  Musculoskeletal: Positive for arthralgias, back pain, gait problem, joint swelling, myalgias and neck stiffness.  Skin: Negative.    Allergic/Immunologic: Negative.   Neurological: Positive for dizziness, tremors, speech difficulty, weakness, numbness and headaches.       Memory loss  Hematological: Bruises/bleeds easily.  Psychiatric/Behavioral: Positive for confusion, sleep disturbance (eds, frequent waking, apnea) and dysphoric mood. The patient is nervous/anxious and is hyperactive.  Objective:  Neurologic Exam  Physical Exam Physical Examination:   Filed Vitals:   11/18/13 1456  BP: 90/60  Pulse: 60  Temp: 97.7 F (36.5 C)    General Examination: The patient is a very pleasant 68 y.o. male in no acute distress. He is situated in his wheelchair. He is frail appearing, but more talkative today.   HEENT: Normocephalic, atraumatic, pupils are equal, round and reactive to light and accommodation. Extraocular tracking shows moderate saccadic breakdown without nystagmus noted. There is limitation to upper gaze. There is mild decrease in eye blink rate. Hearing is mildly impaired. Face is symmetric with moderate facial masking and normal facial sensation. There is no lip, neck or jaw tremor. Neck is moderately rigid with decrease passive ROM. There is a L neck tilt and mild R neck turn. There are no carotid bruits on auscultation. Oropharynx exam reveals moderate mouth dryness. No significant airway crowding is noted. Mallampati is class II. Tongue protrudes centrally and palate elevates symmetrically. There is no drooling. He has a small mobile nodular swelling in the L paratracheal area - per wife, he will have an [place].   Chest: is clear to auscultation without wheezing, rhonchi or crackles noted.  Heart: sounds are regular and normal without murmurs, rubs or gallops noted.   Abdomen: is soft, non-tender and non-distended with normal bowel sounds appreciated on auscultation.  Extremities: There is no pitting edema in the distal lower extremities bilaterally. There are no varicose veins.  Skin: is warm and dry  with no trophic changes noted. Age-related changes are noted on the skin.   Musculoskeletal: exam reveals no obvious joint deformities, tenderness, joint swelling or erythema. I did not see any bruising on his thighs. He's not particularly tender over his back.  Neurologically:  Mental status: The patient is awake and alert, paying good attention. He is able to partially provide the history. His wife provides most of the history. He is oriented to: person, place, situation, day of week, month of year and year. His memory, attention, language and knowledge are impaired. There is no aphasia, agnosia, apraxia or anomia. There is a moderate degree of bradyphrenia. Speech is moderately hypophonic with mild to moderate dysarthria noted. Affect is blunted.   Cranial nerves are as described above under HEENT exam. In addition, shoulder shrug is normal with equal shoulder height noted.  Motor exam: Normal bulk, and strength for age is noted. There are no dyskinesias noted.   Tone is mildly rigid with absence of cogwheeling. There is overall moderate bradykinesia. There is no drift or rebound.  There is a moderate resting tremor in the right upper extremity. The tremor is intermittent.  Romberg is not tested.   Reflexes are 1+ in the upper extremities and trace in the lower extremities.   Fine motor skills exam: Finger taps are moderately impaired on the right and moderately impaired on the left. Hand movements are moderately impaired on the right and moderately impaired on the left. RAP (rapid alternating patting) is moderately impaired on the right and moderately impaired on the left. Foot taps are moderate to severely impaired b/l, foot agility (in the form of heel stomping) is moderately impaired on the right and moderately impaired on the left.    Cerebellar testing shows no dysmetria or intention tremor on finger to nose testing.    Sensory exam is intact to light touch.   Gait, station and balance:  I did not have him stand or walk for me today  as he did not bring his walker and is not stable to walk without it. He is again noted to lean towards the right and he has kyphosis in his upper back.  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 fib arrest last year. He appears to be generally speaking stable again today, and presents for a sooner than scheduled appointment because of concern at his skilled nursing facility regarding his recurrent falls. I do not think he has particularly advanced in his parkinsonism but as usual worry about his polypharmacy and multiple sedating medications in high doses of sedating medications. As far as his Sinemet down as it is 7 times a day and he has been on this for quite some time. Nevertheless, I think it is still working for him and it is not a high dose, but a medium dose. Since they make some changes to his wheelchair he has had some stability when pushing himself up from it. He is able to lock it properly and pushes himself up from the arm rests a little easier as they are higher. I do believe that he needs someone to look at his psychotropic medications. He is on 700 mg of Seroquel altogether. I would particularly like to see his nighttime dose reduced. Of note most if not all of his recent falls were at night according to his wife. I would also like to ask his in house psychiatrist to try to reduce and eliminate his Cogentin. Currently he is on it 3 times a day. I'm not sure if it was started through psychiatry or Dr. love in the past. His blood pressure was a little low today. Cogentin can  cause balance problems, blurry vision, hallucinations and low blood pressure values. He continues to reside at [facility] in [place], [place] and I recommended that he continue with physical therapy at his facility. He is advised not to stand or walk by himself. He needs assistance at all times. His clinical picture is certainly complicated by his chronic bipolar disease, long-standing history of high dose psychotropic medications, history of lithium use for about 18 years, back disease, and advanced Parkinson's, with symptoms dating back to about 20 years ago and some lateralization to the right especially with respect to his rest tremor. He has complications with orthostatic hypotension and hallucinations. We talked about medical treatments and non-pharmacological approaches. I encouraged him to drink more water, change positions slowly and always use his walker with assistance. I answered all their questions today and the patient and his wife were in agreement with the above outlined plan. I would like to see the patient back in 5 months, sooner if the need arises and encouraged them to call with any interim questions, concerns, problems or updates. I also filled out the consultation sheet from his skilled nursing facility.

## 2013-11-18 NOTE — Patient Instructions (Signed)
I will recommend, that your Cogentin gradually be reduced and stopped as it can lower BP, cause hallucinations and balance issues.  Continue with sinemet at the current dose.  Do not walk without assistance.  Drink plenty of water.  Continue PT.  I would also like to see a reduction in your seroquel, especially in the night time dose.  Follow up in December.

## 2013-11-22 ENCOUNTER — Other Ambulatory Visit (HOSPITAL_COMMUNITY): Payer: Self-pay | Admitting: Internal Medicine

## 2013-11-22 DIAGNOSIS — R221 Localized swelling, mass and lump, neck: Secondary | ICD-10-CM

## 2013-11-25 ENCOUNTER — Ambulatory Visit (HOSPITAL_COMMUNITY)
Admission: RE | Admit: 2013-11-25 | Discharge: 2013-11-25 | Disposition: A | Discharge status: Home / Self Care | Payer: PRIVATE HEALTH INSURANCE | Source: Ambulatory Visit | Attending: Internal Medicine | Admitting: Internal Medicine

## 2013-11-25 DIAGNOSIS — R221 Localized swelling, mass and lump, neck: Secondary | ICD-10-CM | POA: Diagnosis present

## 2013-11-25 DIAGNOSIS — R220 Localized swelling, mass and lump, head: Secondary | ICD-10-CM | POA: Diagnosis not present

## 2014-04-14 ENCOUNTER — Encounter (HOSPITAL_COMMUNITY): Payer: Self-pay | Admitting: Cardiovascular Disease

## 2014-04-15 ENCOUNTER — Ambulatory Visit: Payer: PRIVATE HEALTH INSURANCE | Admitting: Neurology

## 2014-04-15 ENCOUNTER — Ambulatory Visit (INDEPENDENT_AMBULATORY_CARE_PROVIDER_SITE_OTHER): Payer: PRIVATE HEALTH INSURANCE | Admitting: Neurology

## 2014-04-15 ENCOUNTER — Encounter: Payer: Self-pay | Admitting: Neurology

## 2014-04-15 VITALS — BP 132/74 | HR 60 | Temp 95.5°F | Ht 64.0 in | Wt 196.0 lb

## 2014-04-15 DIAGNOSIS — G20 Parkinson's disease: Secondary | ICD-10-CM

## 2014-04-15 DIAGNOSIS — Z9181 History of falling: Secondary | ICD-10-CM

## 2014-04-15 NOTE — Progress Notes (Signed)
Subjective:    Patient ID: Jeremy Carr is a 68 y.o. male.  HPI     Interim history:  Jeremy Carr is a 68 year old right-handed gentleman with an underlying complex medical history of bipolar disease, stroke (2007), arthritis, COPD (on O2), degenerative cervical and lumbar spine disease, and V. 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 He resides at a skilled nursing facility, [facility] and is accompanied by his wife again today. I last saw him on 11/18/2013 for sooner than scheduled appointment because of recurrent falls. His wife reported that most of his falls occur at night. She did not feel he had progressed in his parkinsonism. He had had some adjustments made to his wheelchair including moving up the brakes to where he was better able to use them and his wheelchair armrest was increased in height. I did not make changes to his Sinemet which was 7 times per day but in my paperwork to a skilled nursing facility I asked him to continue his psychotropic medications. He was on 700 mg of Seroquel altogether as well as Cogentin 3 times a day. Because of mildly low blood pressure I recommended that they see if he can come off of Cogentin.  Today, he reports doing all right. He has per wife, not had any more falls, but he did fall yesterday in the bathroom. He did go by himself, which he is not supposed to. He has been better with getting up with help. He is on Cogentin twice daily, still on seroquel 700 mg daily total and 200 mg of sertraline qHS. He is on tramadol 50 mg qHS and C/L 7 times a day. He is on Hydralazine and Metoprolol. Overall, his wife believes that his falls have decreased primarily because he gets assistance whenever he gets out of bed or whenever he needs to go to the bathroom more consistently.  From the motor standpoint he feels stable.  I saw him on 10/14/2013 at which time I felt he had generally speaking remained fairly stable but I felt that his situation was complicated and that he was at continued risk of falling. He was advised to always use his walker, drink more water and change positions slowly. He was advised to follow the exercises he was shown by his physical therapist. His wife reported that he fell recently in his room. It took 5 people from the skilled nursing facility to get him up. He did complain of some soreness in his buttock to his wife the next day which was a Sunday. His reported occasional delusions, particularly paranoid delusions. His wife indicated that when he had fallen in the past he was afraid that he would be "written up" by the staff, and he did not always understand that writing up a fall does not have a negative implication on him but is part of the protocol, and is for the greater good for the patients and their safety. We had made a sooner appointment for him on 11/17/2013 for which he no showed.  I saw him on 06/02/2013, at which time I did not change medications and advised him to use his walker at all times. His clinical picture was complicated because of chronic bipolar disease with chronic treatment with psychotropic medications including history of lithium use and current use of Seroquel. Symptoms of parkinsonism date back about 20 years ago. I encouraged  him to drink more water as his blood pressure was low.   I saw him on 01/26/2013, at which time I did not change his medications. I asked him to use his walker at all times and recommended supervision with his mobilization. I felt that his history was complicated by his chronic bipolar disease, psychotropic medications, history of lithium use for about 18 years, chronic back disease, and advanced parkinsonism, with symptoms dating back to about 20 years ago.   I first met him on 09/25/2012, at which time I  asked him to take his Sinemet every 3 hours and reduce his baclofen. He had sustained a cardiac arrest and was hospitalized and admitted to the ICU through the emergency room. He had fallen on 06/30/2012 and sustained hypothermia and was found unresponsive by his wife in the morning. I did not make any other changes to his medications but was concerned about his multiple neurotropic and psychotropic medications. He has a complex medical history and his parkinsonism is complicated by orthostatic hypotension, and hallucinations. He no longer sees [name] in psychiatry.   Sx started on the R with tremor at the age of 24. He previously followed by [name] and was last seen by him on 05/26/2012, at which time [name] felt that the patient was stable and because of a history of hallucinations he did not increase his levodopa. The patient has associated memory loss as well. He currently resides at [facility] in [place], [place] since 3/14.   He has bipolar disease on lithium therapy in the past between [place], arthritis with chronic lower back pain, gait disorder and postural hypotension. He was diagnosed with Parkinson's disease in the distant past versus parkinsonism because of his medications. His history is complicated by chronic constipation, memory loss, bladder incontinence, bizarre dreams, hallucinations, spinal stenosis. He has received epidural steroid injections. He needs assistance with his ADLs. He uses a wheelchair cane or walker. 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 In January 2014 his MMSE was 22, clock drawing was 3, animal fluency was 10. He is supposed to be on O2 24/7 for COPD.    His Past Medical History Is Significant For: Past Medical History  Diagnosis Date  . Parkinson disease   . Bipolar 1 disorder   . HTN (hypertension)   . Hyperlipemia   . Stroke   . COPD (chronic obstructive pulmonary disease)   . Arthritis   . Anxiety   .  History of cardiac arrest 09/25/2012  . Orthostatic hypotension 09/25/2012    His Past Surgical History Is Significant For: Past Surgical History  Procedure Laterality Date  . Colonoscopy  05/15/2006    Normal rectum/Long tortuous colon/Normal terminal ileum/ Polyps in the left and right colon resected as described above  . Esophagogastroduodenoscopy  02/05/2006    Normal stomach, normal D-I and D-II/ Four-quadrant distal esophageal erosions, consistent with moderately severe erosive reflux esophagitis, otherwise normal tubular esophagus  . Esophagogastroduodenoscopy  09/06/2009    normal esophagus/small hiatal hernia. s/p 27F  . Colonoscopy  09/06/2009    normal rectum, long tortuous colon. repeat surveillance TCS 09/2014  . Esophageal dilation      ready for 3rd time  . Left heart catheterization with coronary angiogram N/A 07/02/2012    Procedure: LEFT HEART CATHETERIZATION WITH CORONARY ANGIOGRAM;  Surgeon: Burnell Blanks, MD;  Location: [facility] CATH LAB;  Service: Cardiovascular;  Laterality: N/A;    His Family History Is  Significant For: Family History  Problem Relation Age of Onset  . Bipolar disorder Father   . Anxiety disorder Mother   . OCD Mother   . Dementia Mother   . Alcohol abuse Sister   . Anxiety disorder Sister   . Dementia Sister   . ADD / ADHD Neg Hx   . Drug abuse Neg Hx   . Depression Neg Hx   . Paranoid behavior Neg Hx   . Schizophrenia Neg Hx   . Seizures Neg Hx   . Sexual abuse Neg Hx   . Physical abuse Neg Hx     His Social History Is Significant For: History   Social History  . Marital Status: Married    Spouse Name: Fraser Din    Number of Children: 0  . Years of Education: 14   Occupational History  .      retired   Social History Main Topics  . Smoking status: Former Research scientist (life sciences)  . Smokeless tobacco: Never Used  . Alcohol Use: No     Comment: remote heavy use  . Drug Use: No  . Sexual Activity: None   Other Topics Concern  . None    Social History Narrative   Consumes 3-4 cups of caffeine daily    His Allergies Are:  Allergies  Allergen Reactions  . Haloperidol Lactate     REACTION: Paranoid and withdrawal  . Pramipexole Dihydrochloride     REACTION: unknown reaction  . Pregabalin     REACTION: unknown reaction  :  His Current Medications Are:  Outpatient Encounter Prescriptions as of 04/15/2014  Medication Sig  . amLODipine (NORVASC) 10 MG tablet Take 10 mg by mouth daily.  Marland Kitchen aspirin EC 81 MG tablet Take 81 mg by mouth daily.  . baclofen (LIORESAL) 10 MG tablet Take 1 tablet (10 mg total) by mouth 4 (four) times daily as needed (muscle spasms).  . benztropine (COGENTIN) 1 MG tablet Take 1 tablet (1 mg total) by mouth 3 (three) times daily.  . bisacodyl (DULCOLAX) 5 MG EC tablet Take 2 tablets (10 mg total) by mouth daily as needed.  . carbidopa-levodopa (SINEMET) 25-100 MG per tablet Take 1 tablet by mouth See admin instructions. Take 1 tablet at 7am, 1 tablet at 11am, 1 tablet at noon, 1 tablet at 3pm, 1 tablet at 5pm, 1 tablet at bedtime.  . clonazePAM (KLONOPIN) 0.5 MG tablet Take 1 tablet (0.5 mg total) by mouth 3 (three) times daily as needed (agita).  . hydrALAZINE (APRESOLINE) 25 MG tablet Take 1 tablet (25 mg total) by mouth every 6 (six) hours.  Marland Kitchen lamoTRIgine (LAMICTAL) 150 MG tablet Take 1 tablet (150 mg total) by mouth daily. 11 am  . levothyroxine (SYNTHROID, LEVOTHROID) 112 MCG tablet 112 mcg daily.  Marland Kitchen lubiprostone (AMITIZA) 24 MCG capsule Take 24 mcg by mouth 2 (two) times daily.  . metoprolol (LOPRESSOR) 50 MG tablet Take 1 tablet (50 mg total) by mouth 2 (two) times daily.  Marland Kitchen omeprazole (PRILOSEC) 20 MG capsule Take 20 mg by mouth 2 (two) times daily.   Marland Kitchen PATANOL 0.1 % ophthalmic solution Place 2 drops into both eyes daily.  . polyethylene glycol (MIRALAX / GLYCOLAX) packet Take 17 g by mouth daily.  . pravastatin (PRAVACHOL) 20 MG tablet Take 1 tablet (20 mg total) by mouth daily.  Marland Kitchen  pyridostigmine (MESTINON) 60 MG tablet Take 60 mg by mouth 2 (two) times daily.   . QUEtiapine (SEROQUEL XR) 300 MG 24 hr tablet Take  500 mg by mouth at bedtime. Take with one 200 mg tablet.  . sertraline (ZOLOFT) 100 MG tablet Take 2 tablets (200 mg total) by mouth at bedtime.  . traMADol (ULTRAM) 50 MG tablet Take 50 mg by mouth at bedtime.   :  Review of Systems:  Out of a complete 14 point review of systems, all are reviewed and negative with the exception of these symptoms as listed below:   Review of Systems  All other systems reviewed and are negative.   Objective:  Neurologic Exam  Physical Exam Physical Examination:   Filed Vitals:   04/15/14 1146  BP: 132/74  Pulse: 60  Temp: 95.5 F (35.3 C)    General Examination: The patient is a very pleasant 68 y.o. male in no acute distress. He is situated in his wheelchair. He is frail appearing, but more talkative today. He is in good spirits.   HEENT: Normocephalic, atraumatic, pupils are equal, round and reactive to light and accommodation. Extraocular tracking shows moderate saccadic breakdown without nystagmus noted. There is limitation to upper gaze. There is mild decrease in eye blink rate. Hearing is mildly impaired. Face is symmetric with moderate facial masking and normal facial sensation. There is no lip, neck or jaw tremor. Neck is moderately rigid with decrease passive ROM. There is a L neck tilt and mild R neck turn. There are no carotid bruits on auscultation. Oropharynx exam reveals moderate mouth dryness. No significant airway crowding is noted. Mallampati is class II. Tongue protrudes centrally and palate elevates symmetrically. There is no drooling.   Chest: is clear to auscultation without wheezing, rhonchi or crackles noted.  Heart: sounds are regular and normal without murmurs, rubs or gallops noted.   Abdomen: is soft, non-tender and non-distended with normal bowel sounds appreciated on  auscultation.  Extremities: There is 1 + pitting edema in the distal lower extremities bilaterally. There are no varicose veins.  Skin: is warm and dry with no trophic changes noted. Age-related changes are noted on the skin.   Musculoskeletal: exam reveals no obvious joint deformities, tenderness, joint swelling or erythema. I did not see any bruising on his thighs. He's not particularly tender over his back.  Neurologically:  Mental status: The patient is awake and alert, paying good attention. He is able to partially provide the history. His wife provides most of the history. He is oriented to: person, place, situation, day of week, month of year and year. His memory, attention, language and knowledge are impaired. There is no aphasia, agnosia, apraxia or anomia. There is a moderate degree of bradyphrenia. Speech is moderately hypophonic with mild to moderate dysarthria noted. Affect is blunted.   Cranial nerves are as described above under HEENT exam. In addition, shoulder shrug is normal with equal shoulder height noted.  Motor exam: Normal bulk, and strength for age is noted. There are no dyskinesias noted.   Tone is mildly rigid with absence of cogwheeling. There is overall moderate bradykinesia. There is no drift or rebound.  There is a moderate resting tremor in the right upper extremity. The tremor is intermittent.  Romberg is not tested.   Reflexes are 1+ in the upper extremities and trace in the lower extremities.   Fine motor skills exam: Finger taps are moderately impaired on the right and moderately impaired on the left. Hand movements are moderately impaired on the right and moderately impaired on the left. RAP (rapid alternating patting) is moderately impaired on the right and moderately impaired  on the left. Foot taps are moderate to severely impaired b/l, foot agility (in the form of heel stomping) is moderately impaired on the right and moderately impaired on the left.     Cerebellar testing shows no dysmetria or intention tremor on finger to nose testing, but movements are slow.    Sensory exam is intact to light touch, pinprick, temperature and vibration sense in the upper and lower extremities..   Gait, station and balance: I did not have him stand or walk for me today as he did not bring his walker and is not stable to walk without it. 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 fib arrest last year. He appears to be generally speaking stable again today, and presents for a  regular scheduled appointment. I'm not sure he actually benefits from Mestinon. I would like to continue to try to streamline his medications. We will keep the Sinemet the same. It appears that he is on Cogentin twice daily at this time. Sin he had some changes made to his wheelchair he has had some stability when pushing himself up from it. He is able to lock it properly and pushes himself up from the arm rests a little easier as they are higher. he is on Mestinon 60 mg twice daily. I would like to reduce it to 16 g once daily for a month then stop it. I have again requested that his psychiatrist consider tapering his Cogentin of possible. He continues to be on Seroquel 700 mg daily and sertraline 200 mg daily. He is on 3 blood pressure medications and while his blood pressure looks fine today I  have advised his wife to have his primary care physician look at his blood pressure medication regimen.  He continues to reside at [facility] in [place], [place] and I filled out the consultation form as well. He is again advised not to stand or walk without assistance. Again, his clinical picture is certainly complicated by his chronic bipolar disease, long-standing history of high dose psychotropic medications, history of lithium use for about 18 years, back disease, and advanced Parkinson's, with symptoms dating back to about 20 years ago and some lateralization to the right especially with respect to his rest tremor. He has complications with orthostatic hypotension and hallucinations. We talked about medical treatments and non-pharmacological approaches. I encouraged him to drink more water, change positions slowly and always use his walker with assistance. I answered all their questions today and the patient and his wife were in agreement with the above outlined plan. I would like to see the patient back in 4 months, sooner if the need arises and encouraged them to call with any interim questions, concerns, problems or updates.

## 2014-04-15 NOTE — Patient Instructions (Signed)
Please discuss with your psychiatrist 3 possibility of tapering off of Cogentin. Please discuss with your primary care physician the need for 3 different blood pressure medications. We will keep her Sinemet the same. I will try to get you off of Mestinon if possible. We will reduce it to 60 mg strength one pill once daily for a month and then stop it and see how it goes. Follow up in 4 months.

## 2014-05-25 ENCOUNTER — Encounter: Payer: Self-pay | Admitting: Gastroenterology

## 2014-08-16 ENCOUNTER — Encounter: Payer: Self-pay | Admitting: Neurology

## 2014-08-16 ENCOUNTER — Ambulatory Visit (INDEPENDENT_AMBULATORY_CARE_PROVIDER_SITE_OTHER): Payer: Medicare Other | Admitting: Neurology

## 2014-08-16 VITALS — BP 101/62 | HR 61 | Resp 16

## 2014-08-16 DIAGNOSIS — G20 Parkinson's disease: Secondary | ICD-10-CM

## 2014-08-16 DIAGNOSIS — Z9181 History of falling: Secondary | ICD-10-CM | POA: Diagnosis not present

## 2014-08-16 DIAGNOSIS — R443 Hallucinations, unspecified: Secondary | ICD-10-CM | POA: Diagnosis not present

## 2014-08-16 NOTE — Patient Instructions (Signed)
We will continue with the same medications at this time.  Please do not get up or walk without assistance.

## 2014-08-16 NOTE — Progress Notes (Signed)
Subjective:    Patient ID: Jeremy Carr is a 69 y.o. male.  HPI     Interim history:   Jeremy Carr is a 69 year old right-handed gentleman with an underlying complex medical history of bipolar disease, stroke (2007), arthritis, COPD (on O2), degenerative cervical and lumbar spine disease, and V. 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 He resides at a skilled nursing facility, [facility] and is accompanied by his wife again today. I last saw him on 04/15/2014, at which time he reported doing all right. He had had no recent falls except for the day before his appointment when he fell in the bathroom. He was not supposed to go alone. He was doing better with getting assistance typically. He was on Cogentin twice daily and I suggested they talk to the psychiatrist about reducing it. I suggested we tapered him off of Mestinon. He was still on high-dose Seroquel and 200 mg of sertraline. I continued him on Sinemet which was 7 times a day. He was on 3 blood pressure medications and I suggested they addressed with her primary care physician whether he really does need to be on 3 blood pressure medications.  Today, 08/16/2014: I reviewed his medications on his MAR: He is currently on baby aspirin, generic Sinemet 25-100 milligrams strength one tablet 6 times a day (7, 10, 1PM 4PM, 7PM and 10PM), vitamin D 50,000 units daily, Lamictal 150 mg daily, MiraLAX once daily, Pravachol 20 mg once daily, Seroquel 500 mg at night and 200 mg in the morning, sertraline 100 mg twice daily, Synthroid 112 g daily, tramadol once daily in the evening, Zyrtec once daily in the morning, Amitiza twice daily, Cogentin 1 mg twice daily, clonazepam 0.5 mg twice daily, Colace twice daily, Lopressor 50 mg twice daily, Prilosec 40 mg twice daily,  baclofen 10 mg 4 times a day, hydralazine 25 mg 4 times a day.  I reviewed his blood work from 08/03/2014 through Dr. Julianne Rice office: CBC with differential was fairly unremarkable with stable mild decrease in hemoglobin and hematocrit, CMP was unremarkable, TSH was normal. His wife provides most of the history today: he fell yesterday and bumped the area above the R eye brow. No bleeding, no LOC, no HA. He was trying to get to the bathroom. He also slid off the bed one time. All in all, he fell maybe 3-4 times since last time, thankfully, no injuries. He has had issues with getting up without assistance. He has had some anxiety. He has no new complaints. He has intermittent mild hallucinations but no recent paranoia.  Previously:   I saw him on 11/18/2013 for sooner than scheduled appointment because of recurrent falls. His wife reported that most of his falls occur at night. She did not feel he had progressed in his parkinsonism. He had had some adjustments made to his wheelchair including moving up the brakes to where he was better able to use them and his wheelchair armrest was increased in height. I did not make changes to his Sinemet which was 7 times per day but in my paperwork to a skilled nursing facility I asked him to continue his psychotropic medications. He was on 700 mg of Seroquel altogether as well as Cogentin 3 times a day. Because of mildly low blood pressure I recommended that they see if he can come off of Cogentin.  I  saw him on 10/14/2013 at which time I felt he had generally speaking remained fairly stable but I felt that his situation was complicated and that he was at continued risk of falling. He was advised to always use his walker, drink more water and change positions slowly. He was advised to follow the exercises he was shown by his physical therapist. His wife reported that he fell recently in his room. It took 5 people from the skilled nursing facility to get him up. He did  complain of some soreness in his buttock to his wife the next day which was a Sunday. His reported occasional delusions, particularly paranoid delusions. His wife indicated that when he had fallen in the past he was afraid that he would be "written up" by the staff, and he did not always understand that writing up a fall does not have a negative implication on him but is part of the protocol, and is for the greater good for the patients and their safety. We had made a sooner appointment for him on 11/17/2013 for which he no showed.  I saw him on 06/02/2013, at which time I did not change medications and advised him to use his walker at all times. His clinical picture was complicated because of chronic bipolar disease with chronic treatment with psychotropic medications including history of lithium use and current use of Seroquel. Symptoms of parkinsonism date back about 20 years ago. I encouraged him to drink more water as his blood pressure was low.   I saw him on 01/26/2013, at which time I did not change his medications. I asked him to use his walker at all times and recommended supervision with his mobilization. I felt that his history was complicated by his chronic bipolar disease, psychotropic medications, history of lithium use for about 18 years, chronic back disease, and advanced parkinsonism, with symptoms dating back to about 20 years ago.   I first met him on 09/25/2012, at which time I asked him to take his Sinemet every 3 hours and reduce his baclofen. He had sustained a cardiac arrest and was hospitalized and admitted to the ICU through the emergency room. He had fallen on 06/30/2012 and sustained hypothermia and was found unresponsive by his wife in the morning. I did not make any other changes to his medications but was concerned about his multiple neurotropic and psychotropic medications. He has a complex medical history and his parkinsonism is complicated by orthostatic hypotension, and  hallucinations. He no longer sees [name] in psychiatry.   Sx started on the R with tremor at the age of 51. He previously followed by [name] and was last seen by him on 05/26/2012, at which time [name] felt that the patient was stable and because of a history of hallucinations he did not increase his levodopa. The patient has associated memory loss as well. He currently resides at [facility]) in [place], [place] since 3/14.   He has bipolar disease on lithium therapy in the past between [facility], arthritis with chronic lower back pain, gait disorder and postural hypotension. He was diagnosed with Parkinson's disease in the distant past versus parkinsonism because of his medications. His history is complicated by chronic constipation, memory loss, bladder incontinence, bizarre dreams, hallucinations, spinal stenosis. He has received epidural steroid injections. He needs assistance with his ADLs. He uses a wheelchair cane or walker. 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 In January 2014 his MMSE  was 22, clock drawing was 3, animal fluency was 10. He is supposed to be on O2 24/7 for COPD.    His Past Medical History Is Significant For: Past Medical History  Diagnosis Date  . Parkinson disease   . Bipolar 1 disorder   . HTN (hypertension)   . Hyperlipemia   . Stroke   . COPD (chronic obstructive pulmonary disease)   . Arthritis   . Anxiety   . History of cardiac arrest 09/25/2012  . Orthostatic hypotension 09/25/2012    His Past Surgical History Is Significant For: Past Surgical History  Procedure Laterality Date  . Colonoscopy  05/15/2006    Normal rectum/Long tortuous colon/Normal terminal ileum/ Polyps in the left and right colon resected as described above  . Esophagogastroduodenoscopy  02/05/2006    Normal stomach, normal D-I and D-II/ Four-quadrant distal esophageal erosions, consistent with moderately severe erosive reflux esophagitis,  otherwise normal tubular esophagus  . Esophagogastroduodenoscopy  09/06/2009    normal esophagus/small hiatal hernia. s/p 32F  . Colonoscopy  09/06/2009    normal rectum, long tortuous colon. repeat surveillance TCS 09/2014  . Esophageal dilation      ready for 3rd time  . Left heart catheterization with coronary angiogram N/A 07/02/2012    Procedure: LEFT HEART CATHETERIZATION WITH CORONARY ANGIOGRAM;  Surgeon: Burnell Blanks, MD;  Location: [facility] CATH LAB;  Service: Cardiovascular;  Laterality: N/A;    His Family History Is Significant For: Family History  Problem Relation Age of Onset  . Bipolar disorder Father   . Anxiety disorder Mother   . OCD Mother   . Dementia Mother   . Alcohol abuse Sister   . Anxiety disorder Sister   . Dementia Sister   . ADD / ADHD Neg Hx   . Drug abuse Neg Hx   . Depression Neg Hx   . Paranoid behavior Neg Hx   . Schizophrenia Neg Hx   . Seizures Neg Hx   . Sexual abuse Neg Hx   . Physical abuse Neg Hx     His Social History Is Significant For: History   Social History  . Marital Status: Married    Spouse Name: Fraser Din  . Number of Children: 0  . Years of Education: 14   Occupational History  .      retired   Social History Main Topics  . Smoking status: Former Research scientist (life sciences)  . Smokeless tobacco: Never Used  . Alcohol Use: No     Comment: remote heavy use  . Drug Use: No  . Sexual Activity: Not on file   Other Topics Concern  . None   Social History Narrative   Consumes 3-4 cups of caffeine daily    His Allergies Are:  Allergies  Allergen Reactions  . Haloperidol Lactate     REACTION: Paranoid and withdrawal  . Pramipexole Dihydrochloride     REACTION: unknown reaction  . Pregabalin     REACTION: unknown reaction  :   His Current Medications Are:  Outpatient Encounter Prescriptions as of 08/16/2014  Medication Sig  . acetaminophen (TYLENOL) 500 MG tablet Take 500 mg by mouth 2 (two) times daily. 2 tablets two times daily   . amLODipine (NORVASC) 10 MG tablet Take 10 mg by mouth daily.  Marland Kitchen aspirin EC 81 MG tablet Take 81 mg by mouth daily.  . baclofen (LIORESAL) 10 MG tablet Take 1 tablet (10 mg total) by mouth 4 (four) times daily as needed (muscle spasms).  . benztropine (  COGENTIN) 1 MG tablet Take 1 tablet (1 mg total) by mouth 3 (three) times daily.  . bisacodyl (DULCOLAX) 5 MG EC tablet Take 2 tablets (10 mg total) by mouth daily as needed.  . carbidopa-levodopa (SINEMET) 25-100 MG per tablet Take 1 tablet by mouth See admin instructions. Take 1 tablet at 7am, 1 tablet at 11am, 1 tablet at noon, 1 tablet at 3pm, 1 tablet at 5pm, 1 tablet at bedtime.  . cetirizine (ZYRTEC) 10 MG tablet Take 10 mg by mouth daily.  . Cholecalciferol 50000 UNITS TABS Take by mouth daily.  . clonazePAM (KLONOPIN) 0.5 MG tablet Take 1 tablet (0.5 mg total) by mouth 3 (three) times daily as needed (agita). (Patient taking differently: Take 0.5 mg by mouth 2 (two) times daily. Take 2 tablets 2 times daily)  . docusate sodium (COLACE) 100 MG capsule Take 100 mg by mouth 2 (two) times daily.  . hydrALAZINE (APRESOLINE) 25 MG tablet Take 1 tablet (25 mg total) by mouth every 6 (six) hours.  Marland Kitchen lamoTRIgine (LAMICTAL) 150 MG tablet Take 1 tablet (150 mg total) by mouth daily. 11 am  . levothyroxine (SYNTHROID, LEVOTHROID) 112 MCG tablet 112 mcg daily.  Marland Kitchen lubiprostone (AMITIZA) 24 MCG capsule Take 24 mcg by mouth 2 (two) times daily.  . metoprolol (LOPRESSOR) 50 MG tablet Take 1 tablet (50 mg total) by mouth 2 (two) times daily.  Marland Kitchen omeprazole (PRILOSEC) 20 MG capsule Take 20 mg by mouth 2 (two) times daily.   Marland Kitchen PATANOL 0.1 % ophthalmic solution Place 2 drops into both eyes daily.  . polyethylene glycol (MIRALAX / GLYCOLAX) packet Take 17 g by mouth daily.  . pravastatin (PRAVACHOL) 20 MG tablet Take 1 tablet (20 mg total) by mouth daily.  Marland Kitchen pyridostigmine (MESTINON) 60 MG tablet Take 60 mg by mouth 2 (two) times daily.   . QUEtiapine  (SEROQUEL XR) 300 MG 24 hr tablet Take 500 mg by mouth at bedtime. Take with one 200 mg tablet.  . sertraline (ZOLOFT) 100 MG tablet Take 2 tablets (200 mg total) by mouth at bedtime.  . traMADol (ULTRAM) 50 MG tablet Take 50 mg by mouth at bedtime.   :  Review of Systems:  Out of a complete 14 point review of systems, all are reviewed and negative with the exception of these symptoms as listed below:   Review of Systems  Cardiovascular:       Bilateral foot swelling   Endocrine:       Patient reports that once the sun goes down, he feels like his "insides are burning up"   Neurological:       He feels that his legs are "freezing more"     Objective:  Neurologic Exam  Physical Exam Physical Examination:   Filed Vitals:   08/16/14 1058  BP: 101/62  Pulse: 61  Resp: 16    General Examination: The patient is a very pleasant 69 y.o. male in no acute distress. He is situated in his wheelchair. He is frail appearing, but more talkative today. He is in good spirits, maybe a little more anxious today.   HEENT: Normocephalic, atraumatic,  except for small scab right above the right eyebrow, healing with mild redness around it, pupils are equal, round and reactive to light and accommodation. Extraocular tracking shows moderate saccadic breakdown without nystagmus noted. There is limitation to upper gaze. There is mild decrease in eye blink rate. Hearing is mildly impaired. Face is symmetric with moderate facial masking and  normal facial sensation. There is no lip, neck or jaw tremor. Neck is moderately rigid with decrease passive ROM. There is a L neck tilt and mild R neck turn. There are no carotid bruits on auscultation. Oropharynx exam reveals moderate mouth dryness. No significant airway crowding is noted. Mallampati is class II. Tongue protrudes centrally and palate elevates symmetrically. There is no drooling.   Chest: is clear to auscultation without wheezing, rhonchi or crackles  noted.  Heart: sounds are regular and normal without murmurs, rubs or gallops noted.   Abdomen: is soft, non-tender and non-distended with normal bowel sounds appreciated on auscultation.  Extremities: There is 1 + pitting edema in the distal lower extremities bilaterally. There are no varicose veins.  Skin: is warm and dry with no trophic changes noted. Age-related changes are noted on the skin.   Musculoskeletal: exam reveals no obvious joint deformities, tenderness, joint swelling or erythema. I did not see any bruising on his thighs. He's not particularly tender over his back.  Neurologically:  Mental status: The patient is awake and alert, paying good attention. He is able to partially provide the history. His wife provides most of the history. He is oriented to: person, place, situation, day of week, month of year and year. His memory, attention, language and knowledge are impaired. There is no aphasia, agnosia, apraxia or anomia. There is a moderate degree of bradyphrenia. Speech is moderately hypophonic with mild to moderate dysarthria noted. Affect is blunted.   Cranial nerves are as described above under HEENT exam. In addition, shoulder shrug is normal with equal shoulder height noted.  Motor exam: Normal bulk, and strength for age is noted. There are  very mild intermittent lower extremity dyskinesias noted today.    Tone is mildly rigid with absence of cogwheeling. There is overall moderate bradykinesia. There is no drift or rebound.  There is Mild intermittent resting tremor in the right upper extremity only. There is a very slight intermittent resting tremor in the right lower extremity.   Romberg is  possible to test as he is not able to stand without assistance.   Reflexes are 1+ in the upper extremities and trace in the lower extremities.   Fine motor skills exam: Finger taps are moderately impaired on the right and moderately impaired on the left. Hand movements are  moderately impaired on the right and moderately impaired on the left. RAP (rapid alternating patting) is moderately impaired on the right and moderately impaired on the left. Foot taps are moderate to severely impaired b/l, foot agility (in the form of heel stomping) is moderately impaired on the right and moderately impaired on the left.    Cerebellar testing shows no dysmetria or intention tremor on finger to nose testing, but movements are slow.   heel to shin is very difficult for him.   Sensory exam is intact to light touch, pinprick, temperature and vibration sense in the upper and lower extremities..   Gait, station and balance:  he cannot stand without maximum assistance. He did not bring his walker today. 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 fib arrest in 2014. He appears to be generally speaking stable again today, and presents for a regular scheduled appointment.  I do see that he has been off of Mestinon. He has had no adverse symptoms after coming off of Mestinon. He is on Sinemet 6 times a day at this time. I will keep him on the same regimen. He is still on Cogentin 1 mg twice daily and I would like to see if we can taper this to once daily but he is reluctant to make any medication changes at this time. He has fallen in the last 4 months maybe 3 or 4 times, thankfully without any injury sustained. Most of his falls have  occurred in the setting of getting up and walking without assistance even though he is better with using his walker at this time. He continues to be on Seroquel 700 mg daily and sertraline 200 mg daily. He is on blood pressure medications and his blood pressure is a little low today. He denies any lightheadedness while sitting.  He continues to reside at [facility] in [place], [place] and I filled out the consultation form as well. He is again advised not to stand or walk without assistance. Again, his clinical picture is certainly complicated by his chronic bipolar disease, long-standing history of different psychotropic medications, history of lithium use for about 18 years, back disease, and advanced Parkinson's, with symptoms dating back to about 20 years ago and some lateralization to the right especially with respect to his rest tremor. He has complications with orthostatic hypotension and hallucinations. We talked about medical treatments and non-pharmacological approaches. I encouraged him to drink more water, change positions slowly and always use his walker with assistance. I answered all their questions today and the patient and his wife were in agreement with the above outlined plan. I would like to see the patient back in 4 to 5 months, sooner if the need arises and encouraged them to call with any interim questions, concerns, problems or updates.   I spent 25 minutes in total face-to-face time with the patient, more than 50% of which was spent in counseling and coordination of care, reviewing test results, reviewing medication and discussing or reviewing the diagnosis of PD or parkinsonism, fall risk, the prognosis and treatment options.

## 2014-09-01 ENCOUNTER — Encounter: Payer: Self-pay | Admitting: Internal Medicine

## 2014-11-29 ENCOUNTER — Other Ambulatory Visit (HOSPITAL_COMMUNITY): Payer: Self-pay

## 2014-11-29 ENCOUNTER — Other Ambulatory Visit (HOSPITAL_COMMUNITY): Payer: Self-pay | Admitting: Internal Medicine

## 2014-11-29 DIAGNOSIS — S0990XA Unspecified injury of head, initial encounter: Secondary | ICD-10-CM

## 2015-01-27 ENCOUNTER — Ambulatory Visit: Payer: Medicare Other | Admitting: Neurology

## 2015-02-07 ENCOUNTER — Ambulatory Visit (INDEPENDENT_AMBULATORY_CARE_PROVIDER_SITE_OTHER): Payer: Medicare Other | Admitting: Neurology

## 2015-02-07 ENCOUNTER — Encounter: Payer: Self-pay | Admitting: Neurology

## 2015-02-07 VITALS — BP 98/58 | HR 60 | Resp 14

## 2015-02-07 DIAGNOSIS — G20 Parkinson's disease: Secondary | ICD-10-CM | POA: Diagnosis not present

## 2015-02-07 DIAGNOSIS — Z9181 History of falling: Secondary | ICD-10-CM | POA: Diagnosis not present

## 2015-02-07 NOTE — Patient Instructions (Signed)
We will continue with your medications.  Drink more water.  Eat well, get up only with assistance, you are at fall risk.

## 2015-02-07 NOTE — Progress Notes (Signed)
Subjective:    Patient ID: Jeremy Carr is a 69 y.o. male.  HPI     Interim history:   Mr. Whyte is a 69 year old right-handed gentleman with an underlying complex medical history of bipolar disease, stroke (2007), arthritis, COPD (on O2), degenerative cervical and lumbar spine disease, and V. 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 He resides at a skilled nursing facility, [facility] and is accompanied by his wife again today. I last saw him on 08/16/2014, at which time he was on the following medications: baby aspirin, generic Sinemet 25-100 milligrams strength one tablet 6 times a day (7, 10, 1PM 4PM, 7PM and 10PM), vitamin D 50,000 units daily, Lamictal 150 mg daily, MiraLAX once daily, Pravachol 20 mg once daily, Seroquel 500 mg at night and 200 mg in the morning, sertraline 100 mg twice daily, Synthroid 112 g daily, tramadol once daily in the evening, Zyrtec once daily in the morning, Amitiza twice daily, Cogentin 1 mg twice daily, clonazepam 0.5 mg twice daily, Colace twice daily, Lopressor 50 mg twice daily, Prilosec 40 mg twice daily, baclofen 10 mg 4 times a day, hydralazine 25 mg 4 times a day.  He had recent blood work on 08/03/2014 through his primary care physician's office: CBC with differential was fairly unremarkable with stable mild decrease in hemoglobin and hematocrit, CMP was unremarkable, TSH was normal. His wife reported a recent fall. He had no loss of consciousness and no headache. He was trying to get to the bathroom. He had slid off the bed as well one time. She reported that all in all he had fallen 3-4 times since his previous visit and thankfully had not injured himself. He had some anxiety. He had some intermittent hallucinations and no paranoid delusions. I suggested we continue  with Sinemet 1 pill 6 times a day. He was reluctant to taper of Cogentin at the time. I suggested we approach medication changes more slowly.   Today, 02/07/2015: He reports doing fairly. He is upset, as he feels, things are not quite right at his NH, he has fallen, and he feels, staff does not always care. His wife does express some concerns on her and if things including that she was told by one of his nurses that he does not get his Sinemet 6 times a day. When I reviewed his MAR he is scheduled to have Sinemet 25-100 milligrams strength one pill 6 times a day, starting at 7 AM every 3 hours for a total of 6 doses. He has had no significant medication changes that I can see. He still on Seroquel high-dose, he is on Lamictal once daily, he is on Cogentin 1 mg twice daily. Most of his falls have occurred when he stands up or walks without assistance. He has been moved to a different room. His bed is not that far from the bathroom. He shares his room with an elderly gentleman with advanced Alzheimer's disease is understand.   Previously:  I saw him on 04/15/2014, at which time he reported doing all right. He had had no recent falls except for the day before his appointment when he fell in the bathroom. He was not supposed to go alone. He was doing better with getting assistance typically. He was on Cogentin twice daily and I suggested they talk to the psychiatrist about reducing it. I suggested we tapered  him off of Mestinon. He was still on high-dose Seroquel and 200 mg of sertraline. I continued him on Sinemet which was 7 times a day. He was on 3 blood pressure medications and I suggested they addressed with her primary care physician whether he really does need to be on 3 blood pressure medications.   I saw him on 11/18/2013 for sooner than scheduled appointment because of recurrent falls. His wife reported that most of his falls occur at night. She did not feel he had progressed in his parkinsonism. He  had had some adjustments made to his wheelchair including moving up the brakes to where he was better able to use them and his wheelchair armrest was increased in height. I did not make changes to his Sinemet which was 7 times per day but in my paperwork to a skilled nursing facility I asked him to continue his psychotropic medications. He was on 700 mg of Seroquel altogether as well as Cogentin 3 times a day. Because of mildly low blood pressure I recommended that they see if he can come off of Cogentin.  I saw him on 10/14/2013 at which time I felt he had generally speaking remained fairly stable but I felt that his situation was complicated and that he was at continued risk of falling. He was advised to always use his walker, drink more water and change positions slowly. He was advised to follow the exercises he was shown by his physical therapist. His wife reported that he fell recently in his room. It took 5 people from the skilled nursing facility to get him up. He did complain of some soreness in his buttock to his wife the next day which was a Sunday. His reported occasional delusions, particularly paranoid delusions. His wife indicated that when he had fallen in the past he was afraid that he would be "written up" by the staff, and he did not always understand that writing up a fall does not have a negative implication on him but is part of the protocol, and is for the greater good for the patients and their safety. We had made a sooner appointment for him on 11/17/2013 for which he no showed.  I saw him on 06/02/2013, at which time I did not change medications and advised him to use his walker at all times. His clinical picture was complicated because of chronic bipolar disease with chronic treatment with psychotropic medications including history of lithium use and current use of Seroquel. Symptoms of parkinsonism date back about 20 years ago. I encouraged him to drink more water as his blood pressure  was low.   I saw him on 01/26/2013, at which time I did not change his medications. I asked him to use his walker at all times and recommended supervision with his mobilization. I felt that his history was complicated by his chronic bipolar disease, psychotropic medications, history of lithium use for about 18 years, chronic back disease, and advanced parkinsonism, with symptoms dating back to about 20 years ago.   I first met him on 09/25/2012, at which time I asked him to take his Sinemet every 3 hours and reduce his baclofen. He had sustained a cardiac arrest and was hospitalized and admitted to the ICU through the emergency room. He had fallen on 06/30/2012 and sustained hypothermia and was found unresponsive by his wife in the morning. I did not make any other changes to his medications but was concerned about his multiple neurotropic and psychotropic medications.  He has a complex medical history and his parkinsonism is complicated by orthostatic hypotension, and hallucinations. He no longer sees [name] in psychiatry.   Sx started on the R with tremor at the age of 75. He previously followed by [name] and was last seen by him on 05/26/2012, at which time [name] felt that the patient was stable and because of a history of hallucinations he did not increase his levodopa. The patient has associated memory loss as well. He currently resides at [facility]) in [place], [place] since 3/14.   He has bipolar disease on lithium therapy in the past between [place], arthritis with chronic lower back pain, gait disorder and postural hypotension. He was diagnosed with Parkinson's disease in the distant past versus parkinsonism because of his medications. His history is complicated by chronic constipation, memory loss, bladder incontinence, bizarre dreams, hallucinations, spinal stenosis. He has received epidural steroid injections. He needs assistance with his ADLs. He uses a wheelchair cane or walker.  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 In January 2014 his MMSE was 22, clock drawing was 3, animal fluency was 10. He is supposed to be on O2 24/7 for COPD.     His Past Medical History Is Significant For: Past Medical History  Diagnosis Date  . Parkinson disease ([place])   . Bipolar 1 disorder ([place])   . HTN (hypertension)   . Hyperlipemia   . Stroke ([place])   . COPD (chronic obstructive pulmonary disease) ([place])   . Arthritis   . Anxiety   . History of cardiac arrest 09/25/2012  . Orthostatic hypotension 09/25/2012    His Past Surgical History Is Significant For: Past Surgical History  Procedure Laterality Date  . Colonoscopy  05/15/2006    Normal rectum/Long tortuous colon/Normal terminal ileum/ Polyps in the left and right colon resected as described above  . Esophagogastroduodenoscopy  02/05/2006    Normal stomach, normal D-I and D-II/ Four-quadrant distal esophageal erosions, consistent with moderately severe erosive reflux esophagitis, otherwise normal tubular esophagus  . Esophagogastroduodenoscopy  09/06/2009    normal esophagus/small hiatal hernia. s/p 55F  . Colonoscopy  09/06/2009    normal rectum, long tortuous colon. repeat surveillance TCS 09/2014  . Esophageal dilation      ready for 3rd time  . Left heart catheterization with coronary angiogram N/A 07/02/2012    Procedure: LEFT HEART CATHETERIZATION WITH CORONARY ANGIOGRAM;  Surgeon: Burnell Blanks, MD;  Location: [facility] CATH LAB;  Service: Cardiovascular;  Laterality: N/A;    His Family History Is Significant For: Family History  Problem Relation Age of Onset  . Bipolar disorder Father   . Anxiety disorder Mother   . OCD Mother   . Dementia Mother   . Alcohol abuse Sister   . Anxiety disorder Sister   . Dementia Sister   . ADD / ADHD Neg Hx   . Drug abuse Neg Hx   . Depression Neg Hx   . Paranoid behavior Neg Hx   . Schizophrenia Neg Hx   . Seizures Neg Hx   . Sexual abuse Neg  Hx   . Physical abuse Neg Hx     His Social History Is Significant For: Social History   Social History  . Marital Status: Married    Spouse Name: Fraser Din  . Number of Children: 0  . Years of Education: 14   Occupational History  .      retired   Science writer  History Main Topics  . Smoking status: Former Research scientist (life sciences)  . Smokeless tobacco: Never Used  . Alcohol Use: No     Comment: remote heavy use  . Drug Use: No  . Sexual Activity: Not Asked   Other Topics Concern  . None   Social History Narrative   Consumes 3-4 cups of caffeine daily    His Allergies Are:  Allergies  Allergen Reactions  . Haloperidol Lactate     REACTION: Paranoid and withdrawal  . Pramipexole Dihydrochloride     REACTION: unknown reaction  . Pregabalin     REACTION: unknown reaction  :   His Current Medications Are:  Outpatient Encounter Prescriptions as of 02/07/2015  Medication Sig  . acetaminophen (TYLENOL) 500 MG tablet Take 500 mg by mouth 2 (two) times daily. 2 tablets two times daily  . aspirin EC 81 MG tablet Take 81 mg by mouth daily.  . baclofen (LIORESAL) 10 MG tablet Take 1 tablet (10 mg total) by mouth 4 (four) times daily as needed (muscle spasms).  . benztropine (COGENTIN) 1 MG tablet Take 1 tablet (1 mg total) by mouth 3 (three) times daily.  . bisacodyl (DULCOLAX) 5 MG EC tablet Take 2 tablets (10 mg total) by mouth daily as needed.  . carbidopa-levodopa (SINEMET) 25-100 MG per tablet Take 1 tablet by mouth See admin instructions. Take 1 tablet at 7am, 1 tablet at 11am, 1 tablet at noon, 1 tablet at 3pm, 1 tablet at 5pm, 1 tablet at bedtime.  . cetirizine (ZYRTEC) 10 MG tablet Take 10 mg by mouth daily.  . Cholecalciferol 50000 UNITS TABS Take by mouth daily.  . clonazePAM (KLONOPIN) 0.5 MG tablet Take 1 tablet (0.5 mg total) by mouth 3 (three) times daily as needed (agita). (Patient taking differently: Take 0.5 mg by mouth 2 (two) times daily. Take 2 tablets 2 times daily)  . docusate  sodium (COLACE) 100 MG capsule Take 100 mg by mouth 2 (two) times daily.  . hydrALAZINE (APRESOLINE) 25 MG tablet Take 1 tablet (25 mg total) by mouth every 6 (six) hours.  Marland Kitchen lamoTRIgine (LAMICTAL) 150 MG tablet Take 1 tablet (150 mg total) by mouth daily. 11 am  . levothyroxine (SYNTHROID, LEVOTHROID) 112 MCG tablet 112 mcg daily.  Marland Kitchen lubiprostone (AMITIZA) 24 MCG capsule Take 24 mcg by mouth 2 (two) times daily.  . metoprolol (LOPRESSOR) 50 MG tablet Take 1 tablet (50 mg total) by mouth 2 (two) times daily.  Marland Kitchen omeprazole (PRILOSEC) 20 MG capsule Take 20 mg by mouth 2 (two) times daily.   Marland Kitchen PATANOL 0.1 % ophthalmic solution Place 2 drops into both eyes daily.  . polyethylene glycol (MIRALAX / GLYCOLAX) packet Take 17 g by mouth daily.  . pravastatin (PRAVACHOL) 20 MG tablet Take 1 tablet (20 mg total) by mouth daily.  . QUEtiapine (SEROQUEL XR) 300 MG 24 hr tablet Take 500 mg by mouth at bedtime. Take with one 200 mg tablet.  . sertraline (ZOLOFT) 100 MG tablet Take 2 tablets (200 mg total) by mouth at bedtime.  . traMADol (ULTRAM) 50 MG tablet Take 50 mg by mouth at bedtime.   . [DISCONTINUED] amLODipine (NORVASC) 10 MG tablet Take 10 mg by mouth daily.  . [DISCONTINUED] pyridostigmine (MESTINON) 60 MG tablet Take 60 mg by mouth 2 (two) times daily.    No facility-administered encounter medications on file as of 02/07/2015.  :  Review of Systems:  Out of a complete 14 point review of systems, all are reviewed  and negative with the exception of these symptoms as listed below:   Review of Systems  Constitutional: Positive for fatigue.  Neurological:       Wife is concerned about how the patient is getting his Carb/Levo at [facility]. She reports that the nurse is giving the medication (total daytime dose) 3 times a day and not 5 times a day as directed. States that the patient is very tired throughout day.      Objective:  Neurologic Exam  Physical Exam Physical Examination:    Filed Vitals:   02/07/15 1405  BP: 98/58  Pulse: 60  Resp: 14    General Examination: The patient is a very pleasant 69 y.o. male in no acute distress. He is situated in the clinic chair. He is in better spirits than before. He is talkative. She is less anxious. He is more interactive.    HEENT: Normocephalic, atraumatic, pupils are equal, round and reactive to light and accommodation. Extraocular tracking shows moderate saccadic breakdown without nystagmus noted. There is limitation to upper gaze. There is mild decrease in eye blink rate. Hearing is mildly impaired. Face is symmetric with moderate facial masking and normal facial sensation. There is no lip, neck or jaw tremor. Neck is moderately rigid with decrease passive ROM. There is a L neck tilt and mild R neck turn. There are no carotid bruits on auscultation. Oropharynx exam reveals moderate mouth dryness. No significant airway crowding is noted. Mallampati is class II. Tongue protrudes centrally and palate elevates symmetrically. There is no drooling.   Chest: is clear to auscultation without wheezing, rhonchi or crackles noted.  Heart: sounds are regular and normal without murmurs, rubs or gallops noted.   Abdomen: is soft, non-tender and non-distended with normal bowel sounds appreciated on auscultation.  Extremities: There is trace edema in the distal lower extremities bilaterally. There are no varicose veins.  Skin: is warm and dry with no trophic changes noted. Age-related changes are noted on the skin.   Musculoskeletal: exam reveals no obvious joint deformities, tenderness, joint swelling or erythema.  Neurologically:  Mental status: The patient is awake and alert, paying good attention. He is able to partially provide the history. His wife provides most of the history. He is oriented to: person, place, situation, day of week, month of year and year. His memory, attention, language and knowledge are impaired. There is no  aphasia, agnosia, apraxia or anomia. There is a moderate degree of bradyphrenia. Speech is moderately hypophonic with mild to moderate dysarthria noted. Affect is blunted.   Cranial nerves are as described above under HEENT exam. In addition, shoulder shrug is normal with equal shoulder height noted.  Motor exam: Normal bulk, and strength for age is noted. There are mild intermittent  generalized dyskinesias noted today.    Tone is mildly rigid with absence of cogwheeling. There is overall moderate bradykinesia. There is no drift or rebound.  There is Mild intermittent resting tremor in the right upper extremity only. There is a very slight intermittent resting tremor in the right lower extremity.   [place] is  possible to test as he is not able to stand without assistance.   Reflexes are 1+ in the upper extremities and trace in the lower extremities.   Fine motor skills exam: Finger taps are moderately impaired on the right and moderately impaired on the left. Hand movements are moderately impaired on the right and moderately impaired on the left. RAP (rapid alternating patting) is moderately  impaired on the right and moderately impaired on the left. Foot taps are moderate to severely impaired b/l, foot agility (in the form of heel stomping) is moderately impaired on the right and moderately impaired on the left.    Cerebellar testing shows no dysmetria or intention tremor on finger to nose testing, but movements are slow.   heel to shin is very difficult for him.   Sensory exam is intact to light touch, pinprick, temperature and vibration sense in the upper and lower extremities..   Gait, station and balance:  he  stands with assistance from both sides. We used his wheelchair for his walker and he is unsteady, and still needs assistance while using the wheelchair as his walker. Tandem walk is not possible. Balance is impaired.  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 fib arrest in 2014, as well as overall deconditioning and weight loss. Hehas remained fairly stable. He has been off of Mestinon. He continues to be on Sinemet but I do want to make sure that he takes one pill 6 times a day as previously advised. He is still on Cogentin 1 mg twice daily. He has fallen but thankfully not hurt himself. He is at fall risk. He is on multiple other medications and I have voiced concern about his polypharmacy before. He is advised to not get up or walk without any assistance. He continues to reside at [facility] in [place], [place] and I filled out the consultation form as well. His clinical picture is complicated by his chronic bipolar disease, long-standing history of different psychotropic medications, history of lithium use for about 18 years, back disease, and advanced Parkinson's, with symptoms dating back to about 20 years ago and some lateralization to the right especially with respect to his rest tremor. He has complications with orthostatic hypotension and hallucinations. We talked about medical treatments and non-pharmacological approaches. I encouraged him to drink more water, change positions slowly and always use his walker with additional assistance.  I would like to see him back in 6 months, sooner if the need arises. I answered all their questions today and the patient and his wife were in agreement.  I spent 25 minutes in total face-to-face time with the patient, more than 50% of which was  spent in counseling and coordination of care, reviewing test results, reviewing medication and discussing or reviewing the diagnosis of PD or parkinsonism, fall risk, the prognosis and treatment options.

## 2015-02-08 ENCOUNTER — Ambulatory Visit: Payer: Medicaid Other | Admitting: Gastroenterology

## 2015-02-13 ENCOUNTER — Inpatient Hospital Stay (HOSPITAL_COMMUNITY)
Admission: EM | Admit: 2015-02-13 | Discharge: 2015-03-07 | DRG: 871 | Disposition: E | Payer: Medicare Other | Attending: Internal Medicine | Admitting: Internal Medicine

## 2015-02-13 ENCOUNTER — Encounter (HOSPITAL_COMMUNITY): Payer: Self-pay | Admitting: *Deleted

## 2015-02-13 ENCOUNTER — Emergency Department (HOSPITAL_COMMUNITY): Payer: Medicare Other

## 2015-02-13 DIAGNOSIS — J189 Pneumonia, unspecified organism: Secondary | ICD-10-CM | POA: Diagnosis present

## 2015-02-13 DIAGNOSIS — Y95 Nosocomial condition: Secondary | ICD-10-CM | POA: Diagnosis present

## 2015-02-13 DIAGNOSIS — J9601 Acute respiratory failure with hypoxia: Secondary | ICD-10-CM | POA: Diagnosis present

## 2015-02-13 DIAGNOSIS — G9340 Encephalopathy, unspecified: Secondary | ICD-10-CM | POA: Diagnosis present

## 2015-02-13 DIAGNOSIS — Z818 Family history of other mental and behavioral disorders: Secondary | ICD-10-CM

## 2015-02-13 DIAGNOSIS — Z87891 Personal history of nicotine dependence: Secondary | ICD-10-CM

## 2015-02-13 DIAGNOSIS — R7401 Elevation of levels of liver transaminase levels: Secondary | ICD-10-CM

## 2015-02-13 DIAGNOSIS — R0602 Shortness of breath: Secondary | ICD-10-CM | POA: Diagnosis not present

## 2015-02-13 DIAGNOSIS — I10 Essential (primary) hypertension: Secondary | ICD-10-CM | POA: Diagnosis present

## 2015-02-13 DIAGNOSIS — M1990 Unspecified osteoarthritis, unspecified site: Secondary | ICD-10-CM | POA: Diagnosis present

## 2015-02-13 DIAGNOSIS — N179 Acute kidney failure, unspecified: Secondary | ICD-10-CM | POA: Diagnosis present

## 2015-02-13 DIAGNOSIS — Z66 Do not resuscitate: Secondary | ICD-10-CM | POA: Diagnosis present

## 2015-02-13 DIAGNOSIS — A419 Sepsis, unspecified organism: Secondary | ICD-10-CM | POA: Diagnosis not present

## 2015-02-13 DIAGNOSIS — N390 Urinary tract infection, site not specified: Secondary | ICD-10-CM

## 2015-02-13 DIAGNOSIS — E872 Acidosis: Secondary | ICD-10-CM | POA: Diagnosis present

## 2015-02-13 DIAGNOSIS — G20 Parkinson's disease: Secondary | ICD-10-CM | POA: Diagnosis present

## 2015-02-13 DIAGNOSIS — R740 Nonspecific elevation of levels of transaminase and lactic acid dehydrogenase [LDH]: Secondary | ICD-10-CM

## 2015-02-13 DIAGNOSIS — J9690 Respiratory failure, unspecified, unspecified whether with hypoxia or hypercapnia: Secondary | ICD-10-CM | POA: Diagnosis present

## 2015-02-13 DIAGNOSIS — J449 Chronic obstructive pulmonary disease, unspecified: Secondary | ICD-10-CM | POA: Diagnosis present

## 2015-02-13 DIAGNOSIS — Z7982 Long term (current) use of aspirin: Secondary | ICD-10-CM

## 2015-02-13 DIAGNOSIS — E785 Hyperlipidemia, unspecified: Secondary | ICD-10-CM | POA: Diagnosis present

## 2015-02-13 DIAGNOSIS — Z515 Encounter for palliative care: Secondary | ICD-10-CM | POA: Diagnosis present

## 2015-02-13 DIAGNOSIS — Z8674 Personal history of sudden cardiac arrest: Secondary | ICD-10-CM

## 2015-02-13 DIAGNOSIS — Z8673 Personal history of transient ischemic attack (TIA), and cerebral infarction without residual deficits: Secondary | ICD-10-CM

## 2015-02-13 DIAGNOSIS — F319 Bipolar disorder, unspecified: Secondary | ICD-10-CM | POA: Diagnosis present

## 2015-02-13 LAB — I-STAT CG4 LACTIC ACID, ED: LACTIC ACID, VENOUS: 4.6 mmol/L — AB (ref 0.5–2.0)

## 2015-02-13 MED ORDER — VANCOMYCIN HCL IN DEXTROSE 1-5 GM/200ML-% IV SOLN
1000.0000 mg | Freq: Once | INTRAVENOUS | Status: AC
  Administered 2015-02-13: 1000 mg via INTRAVENOUS
  Filled 2015-02-13: qty 200

## 2015-02-13 MED ORDER — PIPERACILLIN-TAZOBACTAM 3.375 G IVPB 30 MIN
3.3750 g | Freq: Once | INTRAVENOUS | Status: AC
  Administered 2015-02-13: 3.375 g via INTRAVENOUS
  Filled 2015-02-13: qty 50

## 2015-02-13 MED ORDER — SODIUM CHLORIDE 0.9 % IV BOLUS (SEPSIS)
1000.0000 mL | INTRAVENOUS | Status: AC
  Administered 2015-02-13 – 2015-02-14 (×3): 1000 mL via INTRAVENOUS

## 2015-02-13 NOTE — ED Provider Notes (Signed)
CSN: [id number]     Arrival date & time 03/05/15  2316 History   First MD Initiated Contact with Patient Mar 05, 2015 2321     Chief Complaint  Patient presents with  . Shortness of Breath     (Consider location/radiation/quality/duration/timing/severity/associated sxs/prior Treatment) Patient is a 69 y.o. male presenting with shortness of breath. The history is provided by the EMS personnel and the spouse. The history is limited by the condition of the patient (Unresponsive).  Shortness of Breath He was transferred from [facility] because of respiratory distress. He has baseline dementia and Parkinson's disease but is normally ambulatory and conversant. He reportedly had aspiration this morning and was started on levofloxacin and was going to be continued on Zosyn. Respiratory status deteriorated and he was transferred here. Family had wanted him transferred to [facility], but EMS was concerned that his condition was such that he would not survive that transport. He arrived with ventilatory assistance via Ambu bag. He had DO NOT RESUSCITATE paperwork including request for no intubation and no ICU care. His wife had arrived and stated that she was not certain that she wished to continue with the DO NOT INTUBATE order and that he did have a three-day stay in intensive care in March.   Past Medical History  Diagnosis Date  . Parkinson disease (HCC)   . Bipolar 1 disorder (HCC)   . HTN (hypertension)   . Hyperlipemia   . Stroke (HCC)   . COPD (chronic obstructive pulmonary disease) (HCC)   . Arthritis   . Anxiety   . History of cardiac arrest 09/25/2012  . Orthostatic hypotension 09/25/2012   Past Surgical History  Procedure Laterality Date  . Colonoscopy  05/15/2006    Normal rectum/Long tortuous colon/Normal terminal ileum/ Polyps in the left and right colon resected as described above  . Esophagogastroduodenoscopy  02/05/2006    Normal stomach, normal D-I and D-II/ Four-quadrant  distal esophageal erosions, consistent with moderately severe erosive reflux esophagitis, otherwise normal tubular esophagus  . Esophagogastroduodenoscopy  09/06/2009    normal esophagus/small hiatal hernia. s/p 84F  . Colonoscopy  09/06/2009    normal rectum, long tortuous colon. repeat surveillance TCS 09/2014  . Esophageal dilation      ready for 3rd time  . Left heart catheterization with coronary angiogram N/A 07/02/2012    Procedure: LEFT HEART CATHETERIZATION WITH CORONARY ANGIOGRAM;  Surgeon: Kathleene Hazel, MD;  Location: [facility] CATH LAB;  Service: Cardiovascular;  Laterality: N/A;   Family History  Problem Relation Age of Onset  . Bipolar disorder Father   . Anxiety disorder Mother   . OCD Mother   . Dementia Mother   . Alcohol abuse Sister   . Anxiety disorder Sister   . Dementia Sister   . ADD / ADHD Neg Hx   . Drug abuse Neg Hx   . Depression Neg Hx   . Paranoid behavior Neg Hx   . Schizophrenia Neg Hx   . Seizures Neg Hx   . Sexual abuse Neg Hx   . Physical abuse Neg Hx    Social History  Substance Use Topics  . Smoking status: Former Games developer  . Smokeless tobacco: Never Used  . Alcohol Use: No     Comment: remote heavy use    Review of Systems  Unable to perform ROS: Patient unresponsive  Respiratory: Positive for shortness of breath.       Allergies  Haloperidol lactate; Pramipexole dihydrochloride; and Pregabalin  Home Medications  Prior to Admission medications   Medication Sig Start Date End Date Taking? Authorizing Provider  acetaminophen (TYLENOL) 500 MG tablet Take 500 mg by mouth 2 (two) times daily. 2 tablets two times daily    Historical Provider, MD  aspirin EC 81 MG tablet Take 81 mg by mouth daily.    Historical Provider, MD  baclofen (LIORESAL) 10 MG tablet Take 1 tablet (10 mg total) by mouth 4 (four) times daily as needed (muscle spasms). 07/06/12   Russella Dar, NP  benztropine (COGENTIN) 1 MG tablet Take 1 tablet (1 mg total) by  mouth 3 (three) times daily. 08/19/12   Mike Craze, MD  bisacodyl (DULCOLAX) 5 MG EC tablet Take 2 tablets (10 mg total) by mouth daily as needed. 07/06/12   Russella Dar, NP  carbidopa-levodopa (SINEMET) 25-100 MG per tablet Take 1 tablet by mouth See admin instructions. Take 1 tablet at 7am, 1 tablet at 11am, 1 tablet at noon, 1 tablet at 3pm, 1 tablet at 5pm, 1 tablet at bedtime. 07/16/11   Historical Provider, MD  cetirizine (ZYRTEC) 10 MG tablet Take 10 mg by mouth daily.    Historical Provider, MD  Cholecalciferol 50000 UNITS TABS Take by mouth daily.    Historical Provider, MD  clonazePAM (KLONOPIN) 0.5 MG tablet Take 1 tablet (0.5 mg total) by mouth 3 (three) times daily as needed (agita). Patient taking differently: Take 0.5 mg by mouth 2 (two) times daily. Take 2 tablets 2 times daily 08/19/12   Mike Craze, MD  docusate sodium (COLACE) 100 MG capsule Take 100 mg by mouth 2 (two) times daily.    Historical Provider, MD  hydrALAZINE (APRESOLINE) 25 MG tablet Take 1 tablet (25 mg total) by mouth every 6 (six) hours. 07/06/12   Russella Dar, NP  lamoTRIgine (LAMICTAL) 150 MG tablet Take 1 tablet (150 mg total) by mouth daily. 11 am 08/19/12   Mike Craze, MD  levothyroxine (SYNTHROID, LEVOTHROID) 112 MCG tablet 112 mcg daily. 01/23/13   Historical Provider, MD  lubiprostone (AMITIZA) 24 MCG capsule Take 24 mcg by mouth 2 (two) times daily.    Historical Provider, MD  metoprolol (LOPRESSOR) 50 MG tablet Take 1 tablet (50 mg total) by mouth 2 (two) times daily. 07/06/12   Russella Dar, NP  omeprazole (PRILOSEC) 20 MG capsule Take 20 mg by mouth 2 (two) times daily.  07/22/11   Historical Provider, MD  PATANOL 0.1 % ophthalmic solution Place 2 drops into both eyes daily. 10/09/13   Historical Provider, MD  polyethylene glycol (MIRALAX / GLYCOLAX) packet Take 17 g by mouth daily.    Historical Provider, MD  pravastatin (PRAVACHOL) 20 MG tablet Take 1 tablet (20 mg total) by mouth daily.  07/06/12   Russella Dar, NP  QUEtiapine (SEROQUEL XR) 300 MG 24 hr tablet Take 500 mg by mouth at bedtime. Take with one 200 mg tablet. 08/19/12   Mike Craze, MD  sertraline (ZOLOFT) 100 MG tablet Take 2 tablets (200 mg total) by mouth at bedtime. 08/19/12   Mike Craze, MD  traMADol (ULTRAM) 50 MG tablet Take 50 mg by mouth at bedtime.  09/22/12   Historical Provider, MD   BP 153/135 mmHg  Pulse 128  Temp(Src) 97.9 F (36.6 C) (Axillary)  Resp 26  SpO2 81% Physical Exam  Nursing note and vitals reviewed.  69 year old male,  ill-appearing, unresponsive, and in moderate acute respiratorydistress. Vital signs are  significant for  tachycardia, tachypnea, and hypertension. Oxygen saturation is 81% while on 100% oxygen, which is  hypoxic Head is normocephalic and atraumatic.  Pupils 4 mm and sluggishly reactive. Mucous membranes are dry Neck is nontender and supple without adenopathy or JVD. Back is nontender and there is no CVA tenderness. Lungs  have coarse expiratory rhonchi, and audible gurgling is present Chest is nontender. Heart is tachycardic  without murmur. Abdomen is soft, flat, nontender without masses or hepatosplenomegaly and peristalsis is normoactive. Extremities have no cyanosis or edema, full range of motion is present. Skin is warm and dry without rash. Neurologic:  He is not responsive to painful or verbal stimuli, cranial nerves are grossly  intact, there are no gross  motor deficits.  ED Course  Procedures (including critical care time) Labs Review Results for orders placed or performed during the hospital encounter of 02/12/2015  Blood Culture (routine x 2)  Result Value Ref Range   Specimen Description RIGHT ANTECUBITAL    Special Requests BOTTLES DRAWN AEROBIC AND ANAEROBIC [facility] EACH    Culture PENDING    Report Status PENDING   Blood Culture (routine x 2)  Result Value Ref Range   Specimen Description LEFT ANTECUBITAL    Special Requests BOTTLES DRAWN  AEROBIC AND ANAEROBIC [facility] EACH    Culture PENDING    Report Status PENDING   Comprehensive metabolic panel  Result Value Ref Range   Sodium 134 (L) 135 - 145 mmol/L   Potassium 5.3 (H) 3.5 - 5.1 mmol/L   Chloride 99 (L) 101 - 111 mmol/L   CO2 17 (L) 22 - 32 mmol/L   Glucose, Bld 147 (H) 65 - 99 mg/dL   BUN 73 (H) 6 - 20 mg/dL   Creatinine, Ser 1.61 (H) 0.61 - 1.24 mg/dL   Calcium 8.1 (L) 8.9 - 10.3 mg/dL   Total Protein 7.5 6.5 - 8.1 g/dL   Albumin 3.2 (L) 3.5 - 5.0 g/dL   AST 096 (H) 15 - 41 U/L   ALT 12 (L) 17 - 63 U/L   Alkaline Phosphatase 50 38 - 126 U/L   Total Bilirubin 1.2 0.3 - 1.2 mg/dL   GFR calc non Af Amer 12 (L) >60 mL/min   GFR calc Af Amer 14 (L) >60 mL/min   Anion gap 18 (H) 5 - 15  CBC WITH DIFFERENTIAL  Result Value Ref Range   WBC 13.6 (H) 4.0 - 10.5 K/uL   RBC 4.92 4.22 - 5.81 MIL/uL   Hemoglobin 15.0 13.0 - 17.0 g/dL   HCT 04.5 40.9 - 81.1 %   MCV 91.3 78.0 - 100.0 fL   MCH 30.5 26.0 - 34.0 pg   MCHC 33.4 30.0 - 36.0 g/dL   RDW 91.4 78.2 - 95.6 %   Platelets 295 150 - 400 K/uL   Neutrophils Relative % 82 %   Neutro Abs 11.2 (H) 1.7 - 7.7 K/uL   Lymphocytes Relative 11 %   Lymphs Abs 1.5 0.7 - 4.0 K/uL   Monocytes Relative 7 %   Monocytes Absolute 0.9 0.1 - 1.0 K/uL   Eosinophils Relative 0 %   Eosinophils Absolute 0.0 0.0 - 0.7 K/uL   Basophils Relative 0 %   Basophils Absolute 0.0 0.0 - 0.1 K/uL  Urinalysis, Routine w reflex microscopic (not at [facility])  Result Value Ref Range   Color, Urine BROWN (A) YELLOW   APPearance CLOUDY (A) CLEAR   Specific Gravity, Urine 1.025 1.005 - 1.030   pH 5.0 5.0 -  8.0   Glucose, UA NEGATIVE NEGATIVE mg/dL   Hgb urine dipstick MODERATE (A) NEGATIVE   Bilirubin Urine MODERATE (A) NEGATIVE   Ketones, ur TRACE (A) NEGATIVE mg/dL   Protein, ur 30 (A) NEGATIVE mg/dL   Urobilinogen, UA 2.0 (H) 0.0 - 1.0 mg/dL   Nitrite POSITIVE (A) NEGATIVE   Leukocytes, UA NEGATIVE NEGATIVE  Procalcitonin  Result Value Ref Range    Procalcitonin 173.92 ng/mL  Urine microscopic-add on  Result Value Ref Range   Squamous Epithelial / LPF FEW (A) RARE   WBC, UA 0-2 <3 WBC/hpf   RBC / HPF 0-2 <3 RBC/hpf   Bacteria, UA MANY (A) RARE   Casts HYALINE CASTS (A) NEGATIVE   Urine-Other AMORPHOUS URATES/PHOSPHATES   I-Stat CG4 Lactic Acid, ED  (not at  [facility])  Result Value Ref Range   Lactic Acid, Venous 4.60 (HH) 0.5 - 2.0 mmol/L    Imaging Review Dg Chest Port 1 View  02/28/2015   CLINICAL DATA:  Shortness of breath and decreased oxygen saturation. Patient was started on antibiotics today for urinary tract infection and pneumonia.  EXAM: PORTABLE CHEST 1 VIEW  COMPARISON:  11/29/2014  FINDINGS: Shallow inspiration with linear atelectasis in both lung bases. Mild cardiac enlargement without significant pulmonary vascular congestion. Focal infiltration suggested in the right lung base medially may indicate focal pneumonia. No blunting of costophrenic angles. No pneumothorax. Mediastinal contours appear intact. Degenerative changes in the spine and shoulder.  IMPRESSION: Cardiac enlargement. Shallow inspiration with linear atelectasis in the lung bases. Superimposed infiltration in the right cardiophrenic angle may indicate focal pneumonia.   Electronically Signed   By: Burman Nieves M.D.   On: 03/06/2015 23:58   I have personally reviewed and evaluated these images and lab results as part of my medical decision-making.   EKG Interpretation   Date/Time:  Monday February 13 2015 23:22:05 EDT Ventricular Rate:  126 PR Interval:  160 QRS Duration: 86 QT Interval:  278 QTC Calculation: 402 R Axis:   63 Text Interpretation:  Sinus tachycardia Otherwise within normal limits  When compared with ECG of 06/21/2012, HEART RATE has increased Confirmed by  [facility]  MD, Lakevia Perris ([id number]) on 02/04/2015 11:40:04 PM      CRITICAL CARE Performed by: [email or web address],[email or web address] Total critical care time: 100 minutes Critical care time was exclusive of  separately billable procedures and treating other patients. Critical care was necessary to treat or prevent imminent or life-threatening deterioration. Critical care was time spent personally by me on the following activities: development of treatment plan with patient and/or surrogate as well as nursing, discussions with consultants, evaluation of patient's response to treatment, examination of patient, obtaining history from patient or surrogate, ordering and performing treatments and interventions, ordering and review of laboratory studies, ordering and review of radiographic studies, pulse oximetry and re-evaluation of patient's condition.  MDM   Final diagnoses:  Sepsis, due to unspecified organism (HCC)  HCAP (healthcare-associated pneumonia)  Urinary tract infection without hematuria, site unspecified  Acute kidney injury (nontraumatic) (HCC)  Elevated transaminase level   Acute respiratory distress with respiratory failure. With tachycardia, tachypnea, and altered mentation, he meets sepsis criteria so code sepsis is activated. He is started on early goal-directed of fluid therapy. I have discussed the intubation status with his wife and explained that I would avoid intubation, she tells me that until she specifically wishes to reverse the DO NOT INTUBATE order, it would be considered to be in force. She expresses understanding. He is started on  antibiotics for sepsis of unknown source since chest x-ray and urinalysis are not immediately available. Old records are reviewed and he was hospitalized in March for therapeutic hypothermia for out of hospital arrest.  Blood pressure which was elevated initially, fell precipitously to approximately 60 systolic. This came up with additional fluid, but then dropped again. He's developed a fever while in the ED with temperature going up to 100.8. Chest x-ray confirms presence of pneumonia and urinalysis also shows presence of urinary tract infection.  Antibiotics given are appropriate for both sources of infection. Acute kidney injury is identified which is probably related to dehydration. There is associated metabolic acidosis which is most likely secondary to the renal failure. Lactic acid is significantly elevated at 4.6. Mild elevation of transaminases are noted but probably not clinically significant at this point. Patient's wife has decided to maintain DO NOT INTUBATE status. Case is discussed with Dr. Conley Rolls of [facility] who agrees to admit the patient.  Dione Booze, MD 02/07/2015 [phone]

## 2015-02-13 NOTE — ED Notes (Signed)
Pt brought in by [facility] for c/o sob; pt was started on antibiotic today for UTI and pneumonia; pt is unable to answer any questions; pt has DNR and it states do not intubate

## 2015-02-14 ENCOUNTER — Encounter (HOSPITAL_COMMUNITY): Payer: Self-pay | Admitting: Internal Medicine

## 2015-02-14 DIAGNOSIS — J189 Pneumonia, unspecified organism: Secondary | ICD-10-CM | POA: Diagnosis present

## 2015-02-14 DIAGNOSIS — J9601 Acute respiratory failure with hypoxia: Secondary | ICD-10-CM

## 2015-02-14 DIAGNOSIS — Z7982 Long term (current) use of aspirin: Secondary | ICD-10-CM | POA: Diagnosis not present

## 2015-02-14 DIAGNOSIS — J9690 Respiratory failure, unspecified, unspecified whether with hypoxia or hypercapnia: Secondary | ICD-10-CM | POA: Diagnosis present

## 2015-02-14 DIAGNOSIS — M1990 Unspecified osteoarthritis, unspecified site: Secondary | ICD-10-CM | POA: Diagnosis present

## 2015-02-14 DIAGNOSIS — A419 Sepsis, unspecified organism: Secondary | ICD-10-CM | POA: Diagnosis present

## 2015-02-14 DIAGNOSIS — Z66 Do not resuscitate: Secondary | ICD-10-CM | POA: Diagnosis present

## 2015-02-14 DIAGNOSIS — E785 Hyperlipidemia, unspecified: Secondary | ICD-10-CM | POA: Diagnosis present

## 2015-02-14 DIAGNOSIS — I10 Essential (primary) hypertension: Secondary | ICD-10-CM

## 2015-02-14 DIAGNOSIS — E872 Acidosis: Secondary | ICD-10-CM | POA: Diagnosis present

## 2015-02-14 DIAGNOSIS — Z818 Family history of other mental and behavioral disorders: Secondary | ICD-10-CM | POA: Diagnosis not present

## 2015-02-14 DIAGNOSIS — F319 Bipolar disorder, unspecified: Secondary | ICD-10-CM | POA: Diagnosis present

## 2015-02-14 DIAGNOSIS — Z8673 Personal history of transient ischemic attack (TIA), and cerebral infarction without residual deficits: Secondary | ICD-10-CM | POA: Diagnosis not present

## 2015-02-14 DIAGNOSIS — Z8674 Personal history of sudden cardiac arrest: Secondary | ICD-10-CM

## 2015-02-14 DIAGNOSIS — Z87891 Personal history of nicotine dependence: Secondary | ICD-10-CM | POA: Diagnosis not present

## 2015-02-14 DIAGNOSIS — G20 Parkinson's disease: Secondary | ICD-10-CM | POA: Diagnosis present

## 2015-02-14 DIAGNOSIS — N390 Urinary tract infection, site not specified: Secondary | ICD-10-CM | POA: Diagnosis present

## 2015-02-14 DIAGNOSIS — Z515 Encounter for palliative care: Secondary | ICD-10-CM | POA: Diagnosis present

## 2015-02-14 DIAGNOSIS — Y95 Nosocomial condition: Secondary | ICD-10-CM | POA: Diagnosis present

## 2015-02-14 DIAGNOSIS — N179 Acute kidney failure, unspecified: Secondary | ICD-10-CM | POA: Diagnosis present

## 2015-02-14 DIAGNOSIS — G9340 Encephalopathy, unspecified: Secondary | ICD-10-CM | POA: Diagnosis present

## 2015-02-14 DIAGNOSIS — R0602 Shortness of breath: Secondary | ICD-10-CM | POA: Diagnosis present

## 2015-02-14 DIAGNOSIS — J449 Chronic obstructive pulmonary disease, unspecified: Secondary | ICD-10-CM | POA: Diagnosis present

## 2015-02-14 LAB — URINALYSIS, ROUTINE W REFLEX MICROSCOPIC
GLUCOSE, UA: NEGATIVE mg/dL
LEUKOCYTES UA: NEGATIVE
Nitrite: POSITIVE — AB
PH: 5 (ref 5.0–8.0)
Protein, ur: 30 mg/dL — AB
Specific Gravity, Urine: 1.025 (ref 1.005–1.030)
Urobilinogen, UA: 2 mg/dL — ABNORMAL HIGH (ref 0.0–1.0)

## 2015-02-14 LAB — COMPREHENSIVE METABOLIC PANEL
ALT: 12 U/L — AB (ref 17–63)
ANION GAP: 18 — AB (ref 5–15)
AST: 160 U/L — ABNORMAL HIGH (ref 15–41)
Albumin: 3.2 g/dL — ABNORMAL LOW (ref 3.5–5.0)
Alkaline Phosphatase: 50 U/L (ref 38–126)
BUN: 73 mg/dL — ABNORMAL HIGH (ref 6–20)
CHLORIDE: 99 mmol/L — AB (ref 101–111)
CO2: 17 mmol/L — AB (ref 22–32)
Calcium: 8.1 mg/dL — ABNORMAL LOW (ref 8.9–10.3)
Creatinine, Ser: 4.4 mg/dL — ABNORMAL HIGH (ref 0.61–1.24)
GFR, EST AFRICAN AMERICAN: 14 mL/min — AB (ref >60)
GFR, EST NON AFRICAN AMERICAN: 12 mL/min — AB (ref >60)
Glucose, Bld: 147 mg/dL — ABNORMAL HIGH (ref 65–99)
POTASSIUM: 5.3 mmol/L — AB (ref 3.5–5.1)
SODIUM: 134 mmol/L — AB (ref 135–145)
Total Bilirubin: 1.2 mg/dL (ref 0.3–1.2)
Total Protein: 7.5 g/dL (ref 6.5–8.1)

## 2015-02-14 LAB — CBC WITH DIFFERENTIAL/PLATELET
Basophils Absolute: 0 10*3/uL (ref 0.0–0.1)
Basophils Relative: 0 %
EOS ABS: 0 10*3/uL (ref 0.0–0.7)
Eosinophils Relative: 0 %
HEMATOCRIT: 44.9 % (ref 39.0–52.0)
HEMOGLOBIN: 15 g/dL (ref 13.0–17.0)
LYMPHS ABS: 1.5 10*3/uL (ref 0.7–4.0)
LYMPHS PCT: 11 %
MCH: 30.5 pg (ref 26.0–34.0)
MCHC: 33.4 g/dL (ref 30.0–36.0)
MCV: 91.3 fL (ref 78.0–100.0)
MONOS PCT: 7 %
Monocytes Absolute: 0.9 10*3/uL (ref 0.1–1.0)
NEUTROS ABS: 11.2 10*3/uL — AB (ref 1.7–7.7)
NEUTROS PCT: 82 %
Platelets: 295 10*3/uL (ref 150–400)
RBC: 4.92 MIL/uL (ref 4.22–5.81)
RDW: 14.5 % (ref 11.5–15.5)
WBC: 13.6 10*3/uL — AB (ref 4.0–10.5)

## 2015-02-14 LAB — PROCALCITONIN: Procalcitonin: 173.92 ng/mL

## 2015-02-14 LAB — URINE MICROSCOPIC-ADD ON

## 2015-02-14 MED ORDER — SODIUM CHLORIDE 0.9 % IV SOLN
Freq: Once | INTRAVENOUS | Status: AC
  Administered 2015-02-14: 01:00:00 via INTRAVENOUS

## 2015-02-14 MED ORDER — ACETAMINOPHEN 650 MG RE SUPP
RECTAL | Status: AC
  Administered 2015-02-14: 650 mg via RECTAL
  Filled 2015-02-14: qty 1

## 2015-02-14 MED ORDER — NOREPINEPHRINE BITARTRATE 1 MG/ML IV SOLN
0.0000 ug/min | INTRAVENOUS | Status: DC
  Filled 2015-02-14: qty 4

## 2015-02-14 MED ORDER — MORPHINE SULFATE (PF) 2 MG/ML IV SOLN
2.0000 mg | INTRAVENOUS | Status: DC | PRN
  Administered 2015-02-14: 2 mg via INTRAVENOUS
  Filled 2015-02-14: qty 1

## 2015-02-14 MED ORDER — SODIUM CHLORIDE 0.9 % IV SOLN
1.0000 mg/h | INTRAVENOUS | Status: DC
  Administered 2015-02-14: 1 mg/h via INTRAVENOUS
  Filled 2015-02-14: qty 10

## 2015-02-14 MED ORDER — ACETAMINOPHEN 650 MG RE SUPP
650.0000 mg | Freq: Once | RECTAL | Status: AC
  Administered 2015-02-14: 650 mg via RECTAL

## 2015-02-14 MED ORDER — PIPERACILLIN-TAZOBACTAM IN DEX 2-0.25 GM/50ML IV SOLN
INTRAVENOUS | Status: AC
  Filled 2015-02-14: qty 50

## 2015-02-14 MED ORDER — MORPHINE SULFATE 25 MG/ML IV SOLN
INTRAVENOUS | Status: AC
  Filled 2015-02-14: qty 10

## 2015-02-14 MED ORDER — CETYLPYRIDINIUM CHLORIDE 0.05 % MT LIQD: 7.0000 mL | Freq: Two times a day (BID) | OROMUCOSAL | Status: DC

## 2015-02-14 MED ORDER — ATROPINE SULFATE 1 MG/ML IJ SOLN
0.2000 mg | Freq: Once | INTRAMUSCULAR | Status: AC
  Administered 2015-02-14: 0.2 mg via SUBCUTANEOUS
  Filled 2015-02-14: qty 1

## 2015-02-14 MED ORDER — NOREPINEPHRINE BITARTRATE 1 MG/ML IV SOLN
INTRAVENOUS | Status: AC
  Filled 2015-02-14: qty 4

## 2015-02-14 MED ORDER — PIPERACILLIN-TAZOBACTAM IN DEX 2-0.25 GM/50ML IV SOLN
2.2500 g | Freq: Once | INTRAVENOUS | Status: DC
  Filled 2015-02-14: qty 50

## 2015-02-15 LAB — URINE CULTURE: CULTURE: NO GROWTH

## 2015-02-16 ENCOUNTER — Telehealth: Payer: Self-pay | Admitting: Neurology

## 2015-02-16 NOTE — Telephone Encounter (Signed)
Dr. Frances FurbishAthar is aware. We will send wife a sympathy card.

## 2015-02-16 NOTE — Telephone Encounter (Signed)
Pt's wife called to notify Dr. Frances FurbishAthar that her husband ( pt ) passed away Tuesday and to say thank you for the wonderful care.

## 2015-02-18 LAB — CULTURE, BLOOD (ROUTINE X 2)
Culture: NO GROWTH
Culture: NO GROWTH

## 2015-03-07 NOTE — ED Notes (Signed)
Informed Dr Preston Fleeting of BP

## 2015-03-07 NOTE — ED Notes (Signed)
Pt had small BM and passed part of acetaminophen suppository

## 2015-03-07 NOTE — Progress Notes (Signed)
Dr. Ardyth Harps called, will not return to see family, will sign death certificate later today.  Family left to go to [facility], reported will not return.

## 2015-03-07 NOTE — Progress Notes (Signed)
Morphine gtt stopped at time of death, 1220 PM on March 08, 2015.  Wasted 214.6ml of Morphine from bag, witnessed by Doug Sou RN.

## 2015-03-07 NOTE — Progress Notes (Signed)
Nutrition Brief Note  Chart reviewed. Hx of Parkinson's disease and pt and family is not desiring aggressive care.Pt transitioning to comfort care.  No further nutrition interventions warranted at this time.     Royann Shivers MS,RD,CSG,LDN Office: [phone] Pager: [phone]

## 2015-03-07 NOTE — Care Management Note (Addendum)
Case Management Note  Patient Details  Name: Jeremy Carr MRN: [id number] Date of Birth: [birth year]  Expected Discharge Date:                  Expected Discharge Plan:  Expired  In-House Referral:  Clinical Social Work  Discharge planning Services  CM Consult  Post Acute Care Choice:    Choice offered to:     DME Arranged:    DME Agency:     HH Arranged:    HH Agency:     Status of Service:  Completed, signed off  Medicare Important Message Given:    Date Medicare IM Given:    Medicare IM give by:    Date Additional Medicare IM Given:    Additional Medicare Important Message give by:     If discussed at Long Length of Stay Meetings, dates discussed:    Additional Comments: Pt from [facility]. Pt made comfort care on admission and expired <24 hours. No CM needs.   Malcolm Metro, RN 02/28/2015, 2:16 PM

## 2015-03-07 NOTE — Progress Notes (Signed)
Called to room by family, breathing absent, pulse absent.  Kriste Basque, RN to room as second nurse to evaluate patient.  Not breathing, pulse absent.  Family comforted.  Morphine drip stopped.  Non-rebreather removed.  Hernandez notified. Family left to spend time in room.

## 2015-03-07 NOTE — H&P (Signed)
[facility] History and Physical  JOEANTHONY SEELING WUJ:[id number] DOB: [birth year]    PCP:   Pearson Grippe, MD   Chief Complaint: Respiratory extremis.   HPI: Jeremy Carr is an 69 y.o. male with hx of parkinson's disease, prior CVA, hx of several cardiac arrests, COPD, SNF resident with MOST form, bipolar disorder, brought by EMS for unresponsiveness, hypotensive, and in respiratory failure.  He is a DNR patient, and in the ER, evaluation included a UA postive for UTI, CXR showed possible PNA, and serology showed AKI with Cr of 4.0.  He was given IVF, and IV antibiotics.  Lactic acid was 4.6, and he was given IV antibiotics.  His wife was at his bedside, and we spoke about plan of care, where she felt that his quality of life wasn't great at his best, and she expressed desire that since he is critically ill, unlikely to have meaningful recovery, that she would like to have him treated as comfort care only, even if it means that his death would be imminent.    ROS:  Unable.    Past Medical History  Diagnosis Date  . Parkinson disease (HCC)   . Bipolar 1 disorder (HCC)   . HTN (hypertension)   . Hyperlipemia   . Stroke (HCC)   . COPD (chronic obstructive pulmonary disease) (HCC)   . Arthritis   . Anxiety   . History of cardiac arrest 09/25/2012  . Orthostatic hypotension 09/25/2012    Past Surgical History  Procedure Laterality Date  . Colonoscopy  05/15/2006    Normal rectum/Long tortuous colon/Normal terminal ileum/ Polyps in the left and right colon resected as described above  . Esophagogastroduodenoscopy  02/05/2006    Normal stomach, normal D-I and D-II/ Four-quadrant distal esophageal erosions, consistent with moderately severe erosive reflux esophagitis, otherwise normal tubular esophagus  . Esophagogastroduodenoscopy  09/06/2009    normal esophagus/small hiatal hernia. s/p 39F  . Colonoscopy  09/06/2009    normal rectum, long tortuous colon. repeat surveillance  TCS 09/2014  . Esophageal dilation      ready for 3rd time  . Left heart catheterization with coronary angiogram N/A 07/02/2012    Procedure: LEFT HEART CATHETERIZATION WITH CORONARY ANGIOGRAM;  Surgeon: Kathleene Hazel, MD;  Location: [facility] CATH LAB;  Service: Cardiovascular;  Laterality: N/A;    Medications:  HOME MEDS: Prior to Admission medications   Medication Sig Start Date End Date Taking? Authorizing Provider  acetaminophen (TYLENOL) 500 MG tablet Take 500 mg by mouth 2 (two) times daily. 2 tablets two times daily    Historical Provider, MD  aspirin EC 81 MG tablet Take 81 mg by mouth daily.    Historical Provider, MD  baclofen (LIORESAL) 10 MG tablet Take 1 tablet (10 mg total) by mouth 4 (four) times daily as needed (muscle spasms). 07/06/12   Russella Dar, NP  benztropine (COGENTIN) 1 MG tablet Take 1 tablet (1 mg total) by mouth 3 (three) times daily. 08/19/12   Mike Craze, MD  bisacodyl (DULCOLAX) 5 MG EC tablet Take 2 tablets (10 mg total) by mouth daily as needed. 07/06/12   Russella Dar, NP  carbidopa-levodopa (SINEMET) 25-100 MG per tablet Take 1 tablet by mouth See admin instructions. Take 1 tablet at 7am, 1 tablet at 11am, 1 tablet at noon, 1 tablet at 3pm, 1 tablet at 5pm, 1 tablet at bedtime. 07/16/11   Historical Provider, MD  cetirizine (ZYRTEC) 10 MG tablet Take 10 mg by mouth daily.  Historical Provider, MD  Cholecalciferol 50000 UNITS TABS Take by mouth daily.    Historical Provider, MD  clonazePAM (KLONOPIN) 0.5 MG tablet Take 1 tablet (0.5 mg total) by mouth 3 (three) times daily as needed (agita). Patient taking differently: Take 0.5 mg by mouth 2 (two) times daily. Take 2 tablets 2 times daily 08/19/12   Mike Craze, MD  docusate sodium (COLACE) 100 MG capsule Take 100 mg by mouth 2 (two) times daily.    Historical Provider, MD  hydrALAZINE (APRESOLINE) 25 MG tablet Take 1 tablet (25 mg total) by mouth every 6 (six) hours. 07/06/12   Russella Dar, NP   lamoTRIgine (LAMICTAL) 150 MG tablet Take 1 tablet (150 mg total) by mouth daily. 11 am 08/19/12   Mike Craze, MD  levothyroxine (SYNTHROID, LEVOTHROID) 112 MCG tablet 112 mcg daily. 01/23/13   Historical Provider, MD  lubiprostone (AMITIZA) 24 MCG capsule Take 24 mcg by mouth 2 (two) times daily.    Historical Provider, MD  metoprolol (LOPRESSOR) 50 MG tablet Take 1 tablet (50 mg total) by mouth 2 (two) times daily. 07/06/12   Russella Dar, NP  omeprazole (PRILOSEC) 20 MG capsule Take 20 mg by mouth 2 (two) times daily.  07/22/11   Historical Provider, MD  PATANOL 0.1 % ophthalmic solution Place 2 drops into both eyes daily. 10/09/13   Historical Provider, MD  polyethylene glycol (MIRALAX / GLYCOLAX) packet Take 17 g by mouth daily.    Historical Provider, MD  pravastatin (PRAVACHOL) 20 MG tablet Take 1 tablet (20 mg total) by mouth daily. 07/06/12   Russella Dar, NP  QUEtiapine (SEROQUEL XR) 300 MG 24 hr tablet Take 500 mg by mouth at bedtime. Take with one 200 mg tablet. 08/19/12   Mike Craze, MD  sertraline (ZOLOFT) 100 MG tablet Take 2 tablets (200 mg total) by mouth at bedtime. 08/19/12   Mike Craze, MD  traMADol (ULTRAM) 50 MG tablet Take 50 mg by mouth at bedtime.  09/22/12   Historical Provider, MD     Allergies:  Allergies  Allergen Reactions  . Haloperidol Lactate     REACTION: Paranoid and withdrawal  . Pramipexole Dihydrochloride     REACTION: unknown reaction  . Pregabalin     REACTION: unknown reaction    Social History:   reports that he has quit smoking. He has never used smokeless tobacco. He reports that he does not drink alcohol or use illicit drugs.  Family History: Family History  Problem Relation Age of Onset  . Bipolar disorder Father   . Anxiety disorder Mother   . OCD Mother   . Dementia Mother   . Alcohol abuse Sister   . Anxiety disorder Sister   . Dementia Sister   . ADD / ADHD Neg Hx   . Drug abuse Neg Hx   . Depression Neg Hx   .  Paranoid behavior Neg Hx   . Schizophrenia Neg Hx   . Seizures Neg Hx   . Sexual abuse Neg Hx   . Physical abuse Neg Hx      Physical Exam: Filed Vitals:   03/15/15 0055 March 15, 2015 0100 15-Mar-2015 0105 03-15-15 0110  BP: 83/59  Pulse: 102 104 103 101  Temp: 100.5 F (38.1 C) 100.4 F (38 C) 100.3 F (37.9 C) 100.1 F (37.8 C)  TempSrc:      Resp: 32 25 34 34  Weight:      SpO2: 93%  93% 93% 93%   Blood pressure 83/59, pulse 101, temperature 100.1 F (37.8 C), temperature source Axillary, resp. rate 34, weight 78.019 kg (172 lb), SpO2 93 %.  GEN:  Is is having agonal breathing.  PSYCH:  Not responsive.  HEENT: Mucous membranes pink and anicteric; PERRLA; EOM intact; no cervical lymphadenopathy nor thyromegaly or carotid bruit; no JVD; There were no stridor. Neck is very supple. Breasts:: Not examined CHEST WALL: No tenderness CHEST: Tachypnea.  HEART: Regular rate and rhythm.  There are no murmur, rub, or gallops.   BACK: No kyphosis or scoliosis; no CVA tenderness ABDOMEN: soft and non-tender; no masses, no organomegaly, normal abdominal bowel sounds; no pannus; no intertriginous candida. There is no rebound and no distention. Rectal Exam: Not done EXTREMITIES: No bone or joint deformity; age-appropriate arthropathy of the hands and knees; no edema; no ulcerations.  There is no calf tenderness. Genitalia: not examined PULSES: 2+ and symmetric SKIN: Normal hydration no rash or ulceration CNS:not responsive.   Labs on Admission:  Basic Metabolic Panel:  Recent Labs Lab 02/26/2015 2330  NA 134*  K 5.3*  CL 99*  CO2 17*  GLUCOSE 147*  BUN 73*  CREATININE 4.40*  CALCIUM 8.1*   Liver Function Tests:  Recent Labs Lab 03/01/2015 2330  AST 160*  ALT 12*  ALKPHOS 50  BILITOT 1.2  PROT 7.5  ALBUMIN 3.2*   No results for input(s): LIPASE, AMYLASE in the last 168 hours. No results for input(s): AMMONIA in the last 168 hours. CBC:  Recent Labs Lab  02/20/2015 2330  WBC 13.6*  NEUTROABS 11.2*  HGB 15.0  HCT 44.9  MCV 91.3  PLT 295   Radiological Exams on Admission: Dg Chest Port 1 View  02/18/2015   CLINICAL DATA:  Shortness of breath and decreased oxygen saturation. Patient was started on antibiotics today for urinary tract infection and pneumonia.  EXAM: PORTABLE CHEST 1 VIEW  COMPARISON:  11/29/2014  FINDINGS: Shallow inspiration with linear atelectasis in both lung bases. Mild cardiac enlargement without significant pulmonary vascular congestion. Focal infiltration suggested in the right lung base medially may indicate focal pneumonia. No blunting of costophrenic angles. No pneumothorax. Mediastinal contours appear intact. Degenerative changes in the spine and shoulder.  IMPRESSION: Cardiac enlargement. Shallow inspiration with linear atelectasis in the lung bases. Superimposed infiltration in the right cardiophrenic angle may indicate focal pneumonia.   Electronically Signed   By: Burman Nieves M.D.   On: 03/06/2015 23:58    EKG: Independently reviewed.   Assessment/Plan Present on Admission:  . Sepsis (HCC) . Essential hypertension . Acute respiratory failure with hypoxia (HCC) . PARKINSON'S DISEASE . Respiratory failure (HCC)  PLAN:  I will admit him to the floor and provide comfort care only as discussed with his wife.  He will be given IV morphine bolus, followed by morphine drip, atropine to handle secretion, and oxygen.  His death is imminent.   Wife and neighbor at his bedside.  There is no children to notify.    Other plans as per orders.  Code Status: Comfort Measure Only.    Houston Siren, MD. [facility] Pager [phone] 7pm to 7am.  03/05/15, 1:25 AM

## 2015-03-07 NOTE — Progress Notes (Signed)
ANTIBIOTIC CONSULT NOTE-Preliminary  Pharmacy Consult for vancomycin, zosyn Indication: sepsis  Allergies  Allergen Reactions  . Haloperidol Lactate     REACTION: Paranoid and withdrawal  . Pramipexole Dihydrochloride     REACTION: unknown reaction  . Pregabalin     REACTION: unknown reaction    Patient Measurements: Weight: 172 lb (78.019 kg) Adjusted Body Weight: 67 kg  Vital Signs: Temp: 100.7 F (38.2 C) (10/11 0045) Temp Source: Axillary (10/10 2322) BP: 86/39 mmHg (10/11 0045) Pulse Rate: 105 (10/11 0045)  Labs:  Recent Labs  02/26/2015 2330  WBC 13.6*  HGB 15.0  PLT 295  CREATININE 4.40*    CrCl cannot be calculated (Unknown ideal weight.).  No results for input(s): VANCOTROUGH, VANCOPEAK, VANCORANDOM, GENTTROUGH, GENTPEAK, GENTRANDOM, TOBRATROUGH, TOBRAPEAK, TOBRARND, AMIKACINPEAK, AMIKACINTROU, AMIKACIN in the last 72 hours.   Microbiology: Recent Results (from the past 720 hour(s))  Blood Culture (routine x 2)     Status: None (Preliminary result)   Collection Time: 02/19/2015 11:30 PM  Result Value Ref Range Status   Specimen Description RIGHT ANTECUBITAL  Final   Special Requests BOTTLES DRAWN AEROBIC AND ANAEROBIC [facility] EACH  Final   Culture PENDING  Incomplete   Report Status PENDING  Incomplete  Blood Culture (routine x 2)     Status: None (Preliminary result)   Collection Time: 02/18/2015 11:50 PM  Result Value Ref Range Status   Specimen Description LEFT ANTECUBITAL  Final   Special Requests BOTTLES DRAWN AEROBIC AND ANAEROBIC [facility] EACH  Final   Culture PENDING  Incomplete   Report Status PENDING  Incomplete    Medical History: Past Medical History  Diagnosis Date  . Parkinson disease (HCC)   . Bipolar 1 disorder (HCC)   . HTN (hypertension)   . Hyperlipemia   . Stroke (HCC)   . COPD (chronic obstructive pulmonary disease) (HCC)   . Arthritis   . Anxiety   . History of cardiac arrest 09/25/2012  . Orthostatic hypotension 09/25/2012     Medications:  Scheduled:    Assessment: 70 yo male with SOB to ED via EMS from SNF. Pt tachycardic, tachypnic, and AMS. Code sepsis. Est CrCl <20.   Goal of Therapy:  Vancomycin trough level 15-20 mcg/ml  Plan:  Preliminary review of pertinent patient information completed.  Protocol will be initiated with a one-time dose(s) of vancomycin 1 grams given in ED, Zosyn 3.375 grams given in ED and will give 2.25 grams at 0800.  Jeani Hawking clinical pharmacist will complete review during morning rounds to assess patient and finalize treatment regimen.  Trustin Chapa, Berneice Heinrich, RPH Feb 24, 2015,12:55 AM

## 2015-03-07 NOTE — Discharge Summary (Signed)
DEATH SUMMARY Patient was a 69 y/o admitted on 1946/03/25 for extreme respiratory distress, acute encephalopathy and renal failure. He was a hospice patient resident of a SNF with h/o CVA, Parkinson's Disease. Upon admission discussions with patient's wife culminated in him being treated with comfort measures only and placed on a morphine drip. He subsequently expired on 02/10/2015 at 12:20 pm.  Causes of Death: Sepsis 2/2 UTI and PNA Multi-organ dysfunction syndrome Acute Encephalopathy Acute Renal Failure Acute Hypoxemic Respiratory Failure COPD H/o Parkinson's Disease  Peggye Pitt, MD [facility] Pager: [phone]

## 2015-03-07 DEATH — deceased

## 2015-08-09 ENCOUNTER — Ambulatory Visit: Payer: Medicare Other | Admitting: Neurology
